# Patient Record
Sex: Male | Born: 1975 | Race: White | Hispanic: No | Marital: Married | Smoking: Current every day smoker
Health system: Southern US, Academic
[De-identification: ages and names within clinical notes are randomized; demographics above are authoritative.]

## PROBLEM LIST (undated history)

## (undated) DIAGNOSIS — K5190 Ulcerative colitis, unspecified, without complications: Secondary | ICD-10-CM

## (undated) DIAGNOSIS — M12811 Other specific arthropathies, not elsewhere classified, right shoulder: Secondary | ICD-10-CM

## (undated) DIAGNOSIS — M5020 Other cervical disc displacement, unspecified cervical region: Secondary | ICD-10-CM

## (undated) DIAGNOSIS — K284 Chronic or unspecified gastrojejunal ulcer with hemorrhage: Secondary | ICD-10-CM

## (undated) DIAGNOSIS — K219 Gastro-esophageal reflux disease without esophagitis: Secondary | ICD-10-CM

## (undated) DIAGNOSIS — I10 Essential (primary) hypertension: Secondary | ICD-10-CM

## (undated) DIAGNOSIS — E785 Hyperlipidemia, unspecified: Secondary | ICD-10-CM

## (undated) DIAGNOSIS — J449 Chronic obstructive pulmonary disease, unspecified: Secondary | ICD-10-CM

## (undated) DIAGNOSIS — R7303 Prediabetes: Secondary | ICD-10-CM

## (undated) DIAGNOSIS — J45909 Unspecified asthma, uncomplicated: Secondary | ICD-10-CM

## (undated) DIAGNOSIS — K649 Unspecified hemorrhoids: Secondary | ICD-10-CM

## (undated) DIAGNOSIS — E119 Type 2 diabetes mellitus without complications: Secondary | ICD-10-CM

## (undated) DIAGNOSIS — K274 Chronic or unspecified peptic ulcer, site unspecified, with hemorrhage: Secondary | ICD-10-CM

## (undated) DIAGNOSIS — M545 Low back pain, unspecified: Secondary | ICD-10-CM

## (undated) DIAGNOSIS — Z8614 Personal history of Methicillin resistant Staphylococcus aureus infection: Secondary | ICD-10-CM

## (undated) HISTORY — DX: Essential (primary) hypertension: I10

## (undated) HISTORY — DX: Chronic obstructive pulmonary disease, unspecified: J44.9

## (undated) HISTORY — DX: Chronic or unspecified gastrojejunal ulcer with hemorrhage: K28.4

## (undated) HISTORY — DX: Unspecified hemorrhoids: K64.9

## (undated) HISTORY — DX: Hyperlipidemia, unspecified: E78.5

## (undated) HISTORY — DX: Gastro-esophageal reflux disease without esophagitis: K21.9

## (undated) HISTORY — DX: Other cervical disc displacement, unspecified cervical region: M50.20

## (undated) HISTORY — PX: HX BACK SURGERY: SHX140

## (undated) HISTORY — PX: HX CYST REMOVAL: SHX22

## (undated) HISTORY — DX: Unspecified asthma, uncomplicated: J45.909

## (undated) HISTORY — DX: Chronic or unspecified peptic ulcer, site unspecified, with hemorrhage: K27.4

## (undated) HISTORY — DX: Ulcerative colitis, unspecified, without complications: K51.90

## (undated) HISTORY — DX: Other specific arthropathies, not elsewhere classified, right shoulder: M12.811

## (undated) HISTORY — PX: HX TONSILLECTOMY: SHX27

## (undated) HISTORY — PX: HX HERNIA REPAIR: SHX51

## (undated) HISTORY — DX: Low back pain, unspecified: M54.50

---
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
## 2020-09-09 ENCOUNTER — Encounter (INDEPENDENT_AMBULATORY_CARE_PROVIDER_SITE_OTHER): Payer: Self-pay | Admitting: Family

## 2020-09-15 ENCOUNTER — Encounter (INDEPENDENT_AMBULATORY_CARE_PROVIDER_SITE_OTHER): Payer: Self-pay | Admitting: Hematology & Oncology

## 2020-09-16 ENCOUNTER — Ambulatory Visit (INDEPENDENT_AMBULATORY_CARE_PROVIDER_SITE_OTHER): Payer: Medicaid Other | Admitting: Family

## 2020-09-16 ENCOUNTER — Encounter (INDEPENDENT_AMBULATORY_CARE_PROVIDER_SITE_OTHER): Payer: Self-pay | Admitting: Family

## 2020-09-16 ENCOUNTER — Other Ambulatory Visit: Payer: Self-pay

## 2020-09-16 VITALS — BP 146/83 | HR 81 | Temp 98.6°F | Ht 65.0 in | Wt 180.0 lb

## 2020-09-16 DIAGNOSIS — E876 Hypokalemia: Secondary | ICD-10-CM

## 2020-09-16 DIAGNOSIS — Z6829 Body mass index (BMI) 29.0-29.9, adult: Secondary | ICD-10-CM

## 2020-09-16 DIAGNOSIS — D649 Anemia, unspecified: Secondary | ICD-10-CM

## 2020-09-16 DIAGNOSIS — D72829 Elevated white blood cell count, unspecified: Secondary | ICD-10-CM

## 2020-09-16 NOTE — H&P (Addendum)
HEMATOLOGY/ONCOLOGY, [place],   [place]  [address]  [place], [place] [address]      New Patient History and Physical     Name: [name] MRN:  [id number]   Date: 09/16/2020 Age: 45 y.o. DOB: 1975-11-07       Referring Physician: Peri Maris, DO  Primary Care Provider: Peri Maris, DO    Reason for visit/consultation:   Results        History of Present Illness:  Patient is a 45 year old white male that was referred [place] for leukocytosis.  Patient states that he has had generalized abdominal pain for 2 years.  Patient states he has been diagnosed in the past with a bleeding ulcer.  Patient states that he has occasional blood and blood clots with his stool.  The patient states when this occurs the toilet is full blood.  Patient states this has been occurring for the past 2 years along with the abdominal pain.  He states that he was set up for testing when the symptoms initially occurred 2 years ago, but he did not complete them.  He states he lost both parents and a brother in a short period of time and was unable to do the testing.  He still has not completed that testing.  He cannot recall what test were ordered.  He states when the abdominal pain and bleeding started 2 years ago, he was able to tolerate it.  He states over the 2 year period the abdominal pain has gotten worse and he continues to have occasional rectal bleeding with blood clots.  He describes the abdominal pain as cramping and sharp.  He states that the pain is unbearable at times.  He is currently not taking anything for pain.  He states he has had appetite changes.  He states his appetite waxes and wanes.  He states that he does vomit after eating on occasion.  He attributes this to certain foods that he eats.  He states he has had weight fluctuations.  He states he will lose up to 10 lb over a few days and then he will gain it back.  His current weight today is 180 lb.  He states his  highest weight has been 190 lb and his lowest weight was 158 lb.  He states he does have night sweats.  He states that he only has to change his pillowcase due to the sweats and denies changing clothes.  He has complaints of weakness and fatigue.  Patient states that he can have up to 15 bowel movements per day.  He states some are more formed and some are watery.  He states he has the occasional bleeding and blood clots with his stool.  He states when his stool is formed is coated with a slimy gel.  He states has follow-up with Dr. Keith Rake in the past for issues with my liver.  He was sent last week for consult with Dr. Georgiann Cocker.  He states he has a colonoscopy scheduled on 09/23/2020.      Past Medical History  Past Medical History:   Diagnosis Date    Asthma     Bleeding hemorrhoid     Bleeding ulcer     Cervical herniated disc     COPD (chronic obstructive pulmonary disease) (CMS HCC)     GERD (gastroesophageal reflux disease)     Hyperlipidemia     Hypertension     Rotator cuff arthropathy, right  Past Surgical History:   Procedure Laterality Date    HX CYST REMOVAL      lower back    HX HERNIA REPAIR      HX TONSILLECTOMY        Family Medical History:     Problem Relation (Age of Onset)    Anesthesia Complications Father    Arthritis-osteo Mother, Father    Asthma Mother, Father, Brother, Paternal Grandmother, Paternal Grandfather    Blood Clots Father, Paternal Grandmother, Paternal Grandfather    Cancer Mother, Father    Congestive Heart Failure Father, Paternal 24, Paternal Grandfather    Coronary Artery Disease Father, Paternal 3, Paternal Grandfather    Diabetes Mother, Father    Heart Attack Father, Paternal 58, Paternal Grandfather    High Cholesterol Mother, Father    Hypertension (High Blood Pressure) Mother, Father    Stroke Father, Maternal Grandmother, Maternal Grandfather, Paternal 48, Paternal Grandfather          Social  History  Occupation:   Social History     Occupational History    Not on file    Hometown: [place] [address]  Social History     Socioeconomic History    Marital status: Married   Tobacco Use    Smoking status: Current Every Day Smoker     Packs/day: 1.00     Years: 30.00     Pack years: 30.00     Types: Cigarettes    Smokeless tobacco: Never Used   Brewing technologist Use: Never used   Substance and Sexual Activity    Alcohol use: Yes     Comment: occasional    Drug use: Never       Allergies   Allergen Reactions    Flexeril [Cyclobenzaprine]     Noctec [Chloral Hydrate]     Penicillins     Prednisone     Theophylline      Current Outpatient Medications   Medication Sig    ergocalciferol, vitamin D2, (DRISDOL) 1,250 mcg (50,000 unit) Oral Capsule     gabapentin (NEURONTIN) 300 mg Oral Capsule Take 300 mg by mouth Pt doesn't take as he should    Ibuprofen (MOTRIN) 200 mg Oral Tablet Take 200 mg by mouth Four times a day as needed for Pain    omeprazole (PRILOSEC) 40 mg Oral Capsule, Delayed Release(E.C.)        ROS:   Review of Systems   Constitutional: Positive for appetite change, chills, diaphoresis, fatigue and unexpected weight change. Negative for fever.   HENT:   Positive for sore throat and trouble swallowing.    Eyes: Negative.    Respiratory: Positive for cough. Negative for chest tightness, hemoptysis and shortness of breath.    Cardiovascular: Positive for chest pain. Negative for leg swelling and palpitations.   Gastrointestinal: Positive for abdominal pain, blood in stool, diarrhea, nausea and vomiting. Negative for constipation.   Genitourinary: Negative.     Musculoskeletal: Positive for arthralgias.   Skin: Positive for itching.   Neurological: Positive for light-headedness and numbness. Negative for dizziness, headaches and seizures.   Hematological: Negative for adenopathy. Bruises/bleeds easily.   Psychiatric/Behavioral: Positive for sleep disturbance. Negative for depression  and suicidal ideas. The patient is not nervous/anxious.        Physical Examination:  BP (!) 146/83 (Site: Right, Patient Position: Sitting, Cuff Size: Adult)    Pulse 81    Temp 37 C (98.6 F) (Skin)    Ht  1.651 m (5\' 5" )    Wt 81.6 kg (180 lb)    BMI 29.95 kg/m       Physical Exam  Vitals and nursing note reviewed.   Constitutional:       Appearance: Normal appearance.   HENT:      Head: Normocephalic and atraumatic.      Nose: Nose normal.      Mouth/Throat:      Mouth: Mucous membranes are moist.   Eyes:      Extraocular Movements: Extraocular movements intact.      Comments: Bilateral eyes are blood shot   Cardiovascular:      Rate and Rhythm: Normal rate and regular rhythm.      Heart sounds: Normal heart sounds, S1 normal and S2 normal.   Pulmonary:      Effort: Pulmonary effort is normal.      Breath sounds: Normal breath sounds.   Abdominal:      General: Bowel sounds are normal.      Palpations: Abdomen is soft.      Tenderness: There is abdominal tenderness in the epigastric area.   Musculoskeletal:         General: Normal range of motion.      Cervical back: Normal range of motion and neck supple. No bony tenderness.      Thoracic back: No bony tenderness.      Lumbar back: No bony tenderness.   Lymphadenopathy:      Cervical: No cervical adenopathy.      Comments: No supraclavicular or axillary adenopathy.   Skin:     General: Skin is warm and dry.   Neurological:      General: No focal deficit present.      Mental Status: He is alert and oriented to person, place, and time.   Psychiatric:         Mood and Affect: Mood normal.         Speech: Speech normal.         Behavior: Behavior is cooperative.         Thought Content: Thought content normal.         Cognition and Memory: Cognition and memory normal.          Data reviewed:   Labs from 08/14/2020:  WBC 12.8, hemoglobin 16.3, platelets 410000, ANC 8.2, monos 1.1, eos 0.6, hepatitis panel is negative.  Peripheral smear showed neutrophilic  leukocytosis.  Cells are mature.  Monocytosis is present.    Ultrasound of the liver from 08/26/2020:  Liver:  Sonographic features that may reflect hepatic steatosis.  Probable hepatomegaly.  No focal hepatic lesion or abnormal intrahepatic biliary duct dilatation seen.  Gallbladder unremarkable.  Common hepatic duct within normal range in size measuring 0.47 cm in diameter.      Assessment/Plan:   Problem List Items Addressed This Visit        Hematologic/Lymphatic    Leucocytosis - Primary     We will obtain a CBC with diff, peripheral smear, LDH, CRP, sed rate on patient.  We will also obtain a lap score to out the possibility of CML.  We will await results of colonoscopy.  Will plan to see him in follow-up in 3 weeks after all test results are in.         Relevant Orders    CBC/DIFF    COMPREHENSIVE METABOLIC PANEL, NON-FASTING    LDH    C-REACTIVE PROTEIN(CRP),INFLAMMATION    SEDIMENTATION RATE  LEUKOCYTE ALKALINE PHOSPHATASE    GIEMSA STAIN, WITH PATHOLOGIST REVIEW    Anemia     We will check iron studies due to his current rectal bleeding.  We will also have him obtain a vitamin B12 and folate level.  We will await on colonoscopy results.         Relevant Orders    CBC/DIFF    COMPREHENSIVE METABOLIC PANEL, NON-FASTING    IRON    FERRITIN    IRON TRANSFERRIN AND TIBC    GIEMSA STAIN, WITH PATHOLOGIST REVIEW    RETICULOCYTE COUNT    VITAMIN B12    FOLATE          Return in about 3 weeks (around 10/07/2020) for In Person Visit.     Addendum 09/18/2020:  Patient had a potassium level return at 2.4.  Prescription called in for K-Dur 40 mEq daily.  I will mail patient a lab order to obtain a repeat potassium level on Wednesday April 6th.  Left a message for the patient on his voicemail in regards to the prescription and the new lab order.  Encouraged him to call the office if he had any questions or concerns.    Darlys [employer] engineer, FNP-C      CC:  Peri Maris, DO  [place] [id number]    No primary  care provider on file.    Portions of this note may be dictated using voice recognition software or a dictation service. Variances in spelling and vocabulary are possible and unintentional. Not all errors are caught/corrected. Please notify the [facility] if any discrepancies are noted or if the meaning of any statement is not clear.

## 2020-09-16 NOTE — Nursing Note (Signed)
New pt, c/o constant fatigue, no energy. Frequent diarrhea 10-15 per day if eats anything. Colonoscopy next week with Dr. Georgiann Cocker. Bleeding hemrrhoids. Also Dr. Keith Rake will be seeing pt, although not clear as to why Dr. Ferdinand Lango is referring him there. Did see Dr. Posey Pronto previously for elevated liver enzymes

## 2020-09-17 ENCOUNTER — Encounter (INDEPENDENT_AMBULATORY_CARE_PROVIDER_SITE_OTHER): Payer: Self-pay | Admitting: Family

## 2020-09-17 DIAGNOSIS — D72829 Elevated white blood cell count, unspecified: Secondary | ICD-10-CM | POA: Insufficient documentation

## 2020-09-17 DIAGNOSIS — D649 Anemia, unspecified: Secondary | ICD-10-CM | POA: Insufficient documentation

## 2020-09-17 NOTE — Assessment & Plan Note (Signed)
We will check iron studies due to his current rectal bleeding.  We will also have him obtain a vitamin B12 and folate level.  We will await on colonoscopy results.

## 2020-09-17 NOTE — Assessment & Plan Note (Addendum)
We will obtain a CBC with diff, peripheral smear, LDH, CRP, sed rate on patient.  We will also obtain a lap score to out the possibility of CML.  We will await results of colonoscopy.  Will plan to see him in follow-up in 3 weeks after all test results are in.

## 2020-09-18 MED ORDER — POTASSIUM CHLORIDE ER 20 MEQ TABLET,EXTENDED RELEASE(PART/CRYST)
20.0000 meq | ORAL_TABLET | Freq: Two times a day (BID) | ORAL | 0 refills | Status: DC
Start: 2020-09-18 — End: 2020-09-29

## 2020-09-18 NOTE — Addendum Note (Signed)
Addended by: Jerl Santos on: 09/18/2020 09:43 AM     Modules accepted: Orders

## 2020-09-18 NOTE — Addendum Note (Signed)
Addended by: Jerl Santos on: 09/18/2020 09:23 AM     Modules accepted: Orders

## 2020-09-23 ENCOUNTER — Telehealth (INDEPENDENT_AMBULATORY_CARE_PROVIDER_SITE_OTHER): Payer: Self-pay | Admitting: Family

## 2020-09-23 NOTE — Telephone Encounter (Signed)
Notified by lab of K+ 2.4.  Patient was started on K-dur 40 meq last Friday and K+ was reported as 2.4 at that time.  Spoke with patient and inquired if he was taking K-dur as prescribed. Patient states that he had been taking K-dur as prescribed until yesterday as he had EGD and colonoscopy.  Patient states that he has not taken K-dur today as he has felt nauseated, but no vomiting.  Informed patient that potassium level drawn today was still 2.4.  Explained to patient the importance of taking the K-Dur as prescribed and the potential consequences of not taking it such as cardiac arrhythmias.  Instructed patient to take K-Dur 80 mEq today only and starting tomorrow to take K-Dur 60 mEq daily.  Had patient repeat instructions back and he voiced them correctly.  Patient instructed to have potassium level repeated on Monday.  Patient voiced some resistance to this but informed patient that the extreme importance of getting the potassium level back near normal range and the only way to adjust his medication would be to know what the potassium level is.  Patient voiced understanding and was agreeable to have potassium level drawn on Monday.  Instructed him I with put orders in the mail for him to take to the lab on Monday.  Patient expressed no further questions.

## 2020-09-23 NOTE — Addendum Note (Signed)
Addended by: Jerl Santos on: 09/23/2020 01:08 PM     Modules accepted: Orders

## 2020-09-29 ENCOUNTER — Telehealth (INDEPENDENT_AMBULATORY_CARE_PROVIDER_SITE_OTHER): Payer: Self-pay | Admitting: Family

## 2020-09-29 DIAGNOSIS — E876 Hypokalemia: Secondary | ICD-10-CM

## 2020-09-29 MED ORDER — POTASSIUM CHLORIDE ER 20 MEQ TABLET,EXTENDED RELEASE(PART/CRYST)
20.0000 meq | ORAL_TABLET | Freq: Three times a day (TID) | ORAL | 0 refills | Status: AC
Start: 2020-09-29 — End: 2020-10-29

## 2020-09-29 NOTE — Telephone Encounter (Signed)
Spoke with patient in regards to potassium level.  Potassium level from yesterday 2.5.  Asked patient how he was taking his K-dur and he stated that he took 23meq as instructed x 1 day when I last spoke with him and then he has been taking 40 meq daily since. Patient was instructed to take 2meq daily on last call.  Instructed patient to increase potassium to 78meq (3 tabs) daily and have potassium rechecked on 10-05-20.  Instructed patient that i would mail order form to him. Patient repeated instructed back to me and voiced them correctly.  Wife was on speaker phone and listened to entire conversation.  Patient states that he was diagnosed with ulcerative colitis and was to see Dr. Raliegh Ip. patel tomorrow but had to cancel appointment as he just started a new job and was unable to take time off to go to appointment. Explained to patient the importance of seeing Dr. Raliegh Ip patel as he is the provider who would follow him for the ulcerative colitis.  He voiced understanding and states that he would r/s his appointment.

## 2020-10-07 ENCOUNTER — Encounter (INDEPENDENT_AMBULATORY_CARE_PROVIDER_SITE_OTHER): Payer: Self-pay | Admitting: Family

## 2020-10-07 ENCOUNTER — Other Ambulatory Visit: Payer: Self-pay

## 2020-10-07 ENCOUNTER — Ambulatory Visit (INDEPENDENT_AMBULATORY_CARE_PROVIDER_SITE_OTHER): Payer: Medicaid Other | Admitting: Family

## 2020-10-07 VITALS — BP 150/97 | HR 83 | Temp 98.7°F | Ht 65.0 in | Wt 178.0 lb

## 2020-10-07 DIAGNOSIS — E876 Hypokalemia: Secondary | ICD-10-CM | POA: Insufficient documentation

## 2020-10-07 DIAGNOSIS — D72829 Elevated white blood cell count, unspecified: Secondary | ICD-10-CM

## 2020-10-07 DIAGNOSIS — D649 Anemia, unspecified: Secondary | ICD-10-CM

## 2020-10-07 DIAGNOSIS — Z6829 Body mass index (BMI) 29.0-29.9, adult: Secondary | ICD-10-CM

## 2020-10-07 NOTE — Assessment & Plan Note (Signed)
Patient is currently on K-Dur 60 mEq daily.  Most recent potassium level was 3.1.  He will have a repeat CMP.  Instructed patient he would need to follow with his PCP in regards to his hypokalemia.  Patient voiced understanding.

## 2020-10-07 NOTE — Assessment & Plan Note (Signed)
We will assess his iron studies.

## 2020-10-07 NOTE — Cancer Center Note (Signed)
HEMATOLOGY/ONCOLOGY, [place]  [address]  [place], [place] [address]    PROGRESS NOTE        Date: 10/07/2020    Name: [name]  MRN: [id number]  DOB: 1976-05-13   Referring Physician: Peri Maris, DO  Primary Care Provider: Peri Maris, DO    Chief Complaint   Patient presents with   . Leukocytosis       Subjective:  This patient is being seen today in follow up for leukocytosis.  The patient had a colonoscopy done with Dr. Georgiann Cocker since his last visit here.  Patient states he was told he has ulcerative colitis.  Patient is already established with Dr. Keith Rake.  Patient states the diarrhea has eased up some.  He has not noticed any bleeding from his bowels.  Patient was found to be hypokalemic (2.4) with initial labs drawn here.  He is currently taking K-dur 55meq daily.  Potassium level drawn yesterday was 3.1.    ROS:     Pertinent review as addressed in HPI    Objective:   BP (!) 150/97 (Site: Right, Patient Position: Sitting, Cuff Size: Adult)   Pulse 83   Temp 37.1 C (98.7 F) (Skin)   Ht 1.651 m (5\' 5" )   Wt 80.7 kg (178 lb)   BMI 29.62 kg/m       Physical Exam  Vitals and nursing note reviewed.   Constitutional:       Appearance: Normal appearance.   HENT:      Head: Normocephalic.      Nose: Nose normal.      Mouth/Throat:      Mouth: Mucous membranes are moist.   Eyes:      General: No scleral icterus.     Extraocular Movements: Extraocular movements intact.   Cardiovascular:      Rate and Rhythm: Normal rate and regular rhythm.      Heart sounds: Normal heart sounds, S1 normal and S2 normal.   Pulmonary:      Effort: Pulmonary effort is normal.      Breath sounds: Normal breath sounds.   Abdominal:      General: Bowel sounds are normal.      Palpations: Abdomen is soft.      Tenderness: There is no abdominal tenderness.   Musculoskeletal:         General: Normal range of motion.      Cervical back: Normal range of  motion and neck supple. No bony tenderness.      Thoracic back: No bony tenderness.      Lumbar back: No bony tenderness.   Lymphadenopathy:      Cervical: No cervical adenopathy.      Comments: No supraclavicular adenopathy.   Skin:     General: Skin is warm and dry.   Neurological:      General: No focal deficit present.      Mental Status: He is alert and oriented to person, place, and time.   Psychiatric:         Mood and Affect: Mood normal.         Speech: Speech normal.         Behavior: Behavior is cooperative.         Thought Content: Thought content normal.         Cognition and Memory: Cognition and memory normal.        ECOG Status: 0 - Fully  active, able to carry on all pre-disease performance without restriction.     Current Outpatient Medications   Medication Sig   . budesonide (ENTOCORT EC) 3 mg Oral Capsule, Delayed & Ext.Release    . ergocalciferol, vitamin D2, (DRISDOL) 1,250 mcg (50,000 unit) Oral Capsule    . gabapentin (NEURONTIN) 300 mg Oral Capsule Take 300 mg by mouth Pt doesn't take as he should   . Ibuprofen (MOTRIN) 200 mg Oral Tablet Take 200 mg by mouth Four times a day as needed for Pain   . omeprazole (PRILOSEC) 40 mg Oral Capsule, Delayed Release(E.C.)    . potassium chloride (K-DUR) 20 mEq Oral Tab Sust.Rel. Particle/Crystal Take 1 Tablet (20 mEq total) by mouth Three times a day for 30 days Indications: low amount of potassium in the blood   . sucralfate (CARAFATE) 1 gram Oral Tablet      Allergies as of 10/07/2020 - Reviewed 10/07/2020   Allergen Reaction Noted   . Flexeril [cyclobenzaprine]  09/16/2020   . Noctec [chloral hydrate]  09/16/2020   . Penicillins  09/16/2020   . Prednisone  09/16/2020   . Theophylline  09/16/2020     Oncology History    No history exists.      .  Past Medical History:   Diagnosis Date   . Asthma    . Bleeding hemorrhoid    . Bleeding ulcer    . Cervical herniated disc    . COPD (chronic obstructive pulmonary disease) (CMS HCC)    . GERD  (gastroesophageal reflux disease)    . Hyperlipidemia    . Hypertension    . Rotator cuff arthropathy, right    . Ulcerative colitis (CMS [place])      .  Past Surgical History:   Procedure Laterality Date   . HX CYST REMOVAL      lower back   . HX HERNIA REPAIR     . HX TONSILLECTOMY         Labs:  Labs from 09/18/2019:  WBC 11.0, hemoglobin 16.7, platelets 353000, sed rate 10 iron 132, iron sat 26%, ferritin 33, LDH 238, C-reactive protein 0.8, LFTs within normal limits    Pathology from EGD from 09/22/2020:  Reactive gastropathy.  No intestinal metaplasia, dysplasia or malignancy identified.  Pathology from colonoscopy from 09/22/2020:  Colonic mucosa showed increased intraepithelial lymphocytes and mildly increased chronic inflammation within the lamina propria suggestive of lymphocytic colitis.  No active inflammation, granuloma, dysplasia or malignancy identified.    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  Patient needs to be followed by GI for this.  We will send patient today for a CBC with diff and a CMP.  We will see him in follow-up as needed.         Relevant Orders    CBC/DIFF    COMPREHENSIVE METABOLIC PANEL, NON-FASTING    Anemia     We will assess his iron studies.         Relevant Orders    IRON    FERRITIN    IRON TRANSFERRIN AND TIBC       Other    Hypokalemia     Patient is currently on K-Dur 60 mEq daily.  Most recent potassium level was 3.1.  He will have a repeat CMP.  Instructed patient  he would need to follow with his PCP in regards to his hypokalemia.  Patient voiced understanding.                Return if symptoms worsen or fail to improve, for In Person Visit.    [employer] engineer, FNP-C      CC:  PCP General:  Peri Maris, DO  [place] [address]      Portions of this note may be dictated using voice recognition software or a  dictation service. Variances in spelling and vocabulary are possible and unintentional. Not all errors are caught/corrected. Please notify the [facility] if any discrepancies are noted or if the meaning of any statement is not clear.

## 2020-10-07 NOTE — Assessment & Plan Note (Signed)
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  Patient needs to be followed by GI for this.  We will send patient today for a CBC with diff and a CMP.  We will see him in follow-up as needed.

## 2020-10-07 NOTE — Nursing Note (Signed)
Pt here for a follow up

## 2021-08-30 ENCOUNTER — Inpatient Hospital Stay (HOSPITAL_COMMUNITY): Payer: Medicaid Other | Admitting: Internal Medicine

## 2021-08-30 ENCOUNTER — Emergency Department (EMERGENCY_DEPARTMENT_HOSPITAL): Payer: Medicaid Other

## 2021-08-30 ENCOUNTER — Inpatient Hospital Stay
Admission: EM | Admit: 2021-08-30 | Discharge: 2021-09-06 | DRG: 386 | Disposition: A | Discharge status: Home / Self Care | Payer: Medicaid Other | Attending: Internal Medicine | Admitting: Internal Medicine

## 2021-08-30 ENCOUNTER — Other Ambulatory Visit: Payer: Self-pay

## 2021-08-30 ENCOUNTER — Encounter (HOSPITAL_COMMUNITY): Payer: Self-pay

## 2021-08-30 ENCOUNTER — Emergency Department (HOSPITAL_COMMUNITY): Payer: Medicaid Other

## 2021-08-30 DIAGNOSIS — K2950 Unspecified chronic gastritis without bleeding: Secondary | ICD-10-CM | POA: Diagnosis present

## 2021-08-30 DIAGNOSIS — F17210 Nicotine dependence, cigarettes, uncomplicated: Secondary | ICD-10-CM | POA: Diagnosis present

## 2021-08-30 DIAGNOSIS — K529 Noninfective gastroenteritis and colitis, unspecified: Secondary | ICD-10-CM

## 2021-08-30 DIAGNOSIS — Z79899 Other long term (current) drug therapy: Secondary | ICD-10-CM

## 2021-08-30 DIAGNOSIS — K769 Liver disease, unspecified: Secondary | ICD-10-CM | POA: Diagnosis present

## 2021-08-30 DIAGNOSIS — K760 Fatty (change of) liver, not elsewhere classified: Secondary | ICD-10-CM | POA: Diagnosis present

## 2021-08-30 DIAGNOSIS — E669 Obesity, unspecified: Secondary | ICD-10-CM | POA: Diagnosis present

## 2021-08-30 DIAGNOSIS — E872 Acidosis, unspecified: Secondary | ICD-10-CM | POA: Diagnosis present

## 2021-08-30 DIAGNOSIS — Z6831 Body mass index (BMI) 31.0-31.9, adult: Secondary | ICD-10-CM

## 2021-08-30 DIAGNOSIS — Z720 Tobacco use: Secondary | ICD-10-CM | POA: Diagnosis present

## 2021-08-30 DIAGNOSIS — E785 Hyperlipidemia, unspecified: Secondary | ICD-10-CM | POA: Diagnosis present

## 2021-08-30 DIAGNOSIS — F17200 Nicotine dependence, unspecified, uncomplicated: Secondary | ICD-10-CM | POA: Insufficient documentation

## 2021-08-30 DIAGNOSIS — K644 Residual hemorrhoidal skin tags: Secondary | ICD-10-CM | POA: Diagnosis present

## 2021-08-30 DIAGNOSIS — K219 Gastro-esophageal reflux disease without esophagitis: Secondary | ICD-10-CM | POA: Diagnosis present

## 2021-08-30 DIAGNOSIS — J449 Chronic obstructive pulmonary disease, unspecified: Secondary | ICD-10-CM

## 2021-08-30 DIAGNOSIS — R609 Edema, unspecified: Secondary | ICD-10-CM

## 2021-08-30 DIAGNOSIS — Z6830 Body mass index (BMI) 30.0-30.9, adult: Secondary | ICD-10-CM

## 2021-08-30 DIAGNOSIS — K2210 Ulcer of esophagus without bleeding: Secondary | ICD-10-CM | POA: Diagnosis present

## 2021-08-30 DIAGNOSIS — Z6829 Body mass index (BMI) 29.0-29.9, adult: Secondary | ICD-10-CM

## 2021-08-30 DIAGNOSIS — M129 Arthropathy, unspecified: Secondary | ICD-10-CM | POA: Diagnosis present

## 2021-08-30 DIAGNOSIS — K7460 Unspecified cirrhosis of liver: Secondary | ICD-10-CM | POA: Diagnosis present

## 2021-08-30 DIAGNOSIS — I10 Essential (primary) hypertension: Secondary | ICD-10-CM | POA: Diagnosis present

## 2021-08-30 DIAGNOSIS — K5190 Ulcerative colitis, unspecified, without complications: Secondary | ICD-10-CM

## 2021-08-30 DIAGNOSIS — D72829 Elevated white blood cell count, unspecified: Secondary | ICD-10-CM | POA: Diagnosis present

## 2021-08-30 DIAGNOSIS — I8510 Secondary esophageal varices without bleeding: Secondary | ICD-10-CM | POA: Diagnosis present

## 2021-08-30 DIAGNOSIS — R6889 Other general symptoms and signs: Secondary | ICD-10-CM

## 2021-08-30 DIAGNOSIS — G8929 Other chronic pain: Secondary | ICD-10-CM | POA: Diagnosis present

## 2021-08-30 DIAGNOSIS — R7989 Other specified abnormal findings of blood chemistry: Secondary | ICD-10-CM | POA: Diagnosis present

## 2021-08-30 DIAGNOSIS — Z20822 Contact with and (suspected) exposure to covid-19: Secondary | ICD-10-CM | POA: Diagnosis present

## 2021-08-30 DIAGNOSIS — H9209 Otalgia, unspecified ear: Secondary | ICD-10-CM

## 2021-08-30 DIAGNOSIS — D72828 Other elevated white blood cell count: Secondary | ICD-10-CM

## 2021-08-30 DIAGNOSIS — K51811 Other ulcerative colitis with rectal bleeding: Principal | ICD-10-CM | POA: Diagnosis present

## 2021-08-30 DIAGNOSIS — Z8711 Personal history of peptic ulcer disease: Secondary | ICD-10-CM

## 2021-08-30 DIAGNOSIS — E876 Hypokalemia: Secondary | ICD-10-CM | POA: Diagnosis present

## 2021-08-30 DIAGNOSIS — R600 Localized edema: Secondary | ICD-10-CM | POA: Diagnosis present

## 2021-08-30 DIAGNOSIS — M5020 Other cervical disc displacement, unspecified cervical region: Secondary | ICD-10-CM | POA: Diagnosis present

## 2021-08-30 DIAGNOSIS — K449 Diaphragmatic hernia without obstruction or gangrene: Secondary | ICD-10-CM | POA: Diagnosis present

## 2021-08-30 DIAGNOSIS — J029 Acute pharyngitis, unspecified: Secondary | ICD-10-CM

## 2021-08-30 DIAGNOSIS — D649 Anemia, unspecified: Secondary | ICD-10-CM | POA: Diagnosis present

## 2021-08-30 HISTORY — DX: Personal history of Methicillin resistant Staphylococcus aureus infection: Z86.14

## 2021-08-30 LAB — COMPREHENSIVE METABOLIC PANEL, NON-FASTING
ALBUMIN/GLOBULIN RATIO: 1 (ref 0.8–1.4)
ALBUMIN: 3.6 g/dL (ref 3.5–5.7)
ALKALINE PHOSPHATASE: 126 U/L — ABNORMAL HIGH (ref 34–104)
ALT (SGPT): 43 U/L (ref 7–52)
ANION GAP: 16 mmol/L (ref 10–20)
AST (SGOT): 171 U/L — ABNORMAL HIGH (ref 13–39)
BILIRUBIN TOTAL: 1 mg/dL (ref 0.3–1.2)
BUN/CREA RATIO: 6 (ref 6–22)
BUN: 3 mg/dL — ABNORMAL LOW (ref 7–25)
CALCIUM, CORRECTED: 9.4 mg/dL (ref 8.9–10.8)
CALCIUM: 9 mg/dL (ref 8.6–10.3)
CHLORIDE: 93 mmol/L — ABNORMAL LOW (ref 98–107)
CO2 TOTAL: 27 mmol/L (ref 21–31)
CREATININE: 0.52 mg/dL — ABNORMAL LOW (ref 0.60–1.30)
ESTIMATED GFR: 128 mL/min/{1.73_m2} (ref >59)
GLOBULIN: 3.5 (ref 2.9–5.4)
GLUCOSE: 152 mg/dL — ABNORMAL HIGH (ref 74–109)
OSMOLALITY, CALCULATED: 272 mOsm/kg (ref 270–290)
POTASSIUM: 2.5 mmol/L — CL (ref 3.5–5.1)
PROTEIN TOTAL: 7.1 g/dL (ref 6.4–8.9)
SODIUM: 136 mmol/L (ref 136–145)

## 2021-08-30 LAB — ECG 12 LEAD
Atrial Rate: 98 {beats}/min
Calculated P Axis: 74 degrees
Calculated R Axis: 79 degrees
Calculated T Axis: 59 degrees
PR Interval: 176 ms
QRS Duration: 88 ms
QT Interval: 384 ms
QTC Calculation: 490 ms
Ventricular rate: 98 {beats}/min

## 2021-08-30 LAB — COVID-19, FLU A/B, RSV RAPID BY PCR
INFLUENZA VIRUS TYPE A: NOT DETECTED
INFLUENZA VIRUS TYPE B: NOT DETECTED
RESPIRATORY SYNCTIAL VIRUS (RSV): NOT DETECTED
SARS-CoV-2: NOT DETECTED

## 2021-08-30 LAB — URINALYSIS, MACROSCOPIC
BILIRUBIN: NEGATIVE mg/dL
BLOOD: NEGATIVE mg/dL
GLUCOSE: NEGATIVE mg/dL
KETONES: NEGATIVE mg/dL
LEUKOCYTES: NEGATIVE WBCs/uL
NITRITE: NEGATIVE
PH: 6.5 (ref 5.0–9.0)
PROTEIN: NEGATIVE mg/dL
SPECIFIC GRAVITY: 1.006 (ref 1.002–1.030)
UROBILINOGEN: NORMAL mg/dL

## 2021-08-30 LAB — CBC WITH DIFF
BASOPHIL #: 0.2 10*3/uL (ref 0.00–2.50)
BASOPHIL %: 1 % (ref 0–3)
EOSINOPHIL #: 0.2 10*3/uL (ref 0.00–2.40)
EOSINOPHIL %: 1 % (ref 0–7)
HCT: 38.4 % — ABNORMAL LOW (ref 42.0–51.0)
HGB: 13.5 g/dL (ref 13.5–18.0)
LYMPHOCYTE #: 2.4 10*3/uL (ref 2.10–11.00)
LYMPHOCYTE %: 15 % — ABNORMAL LOW (ref 25–45)
MCH: 34.3 pg — ABNORMAL HIGH (ref 27.0–32.0)
MCHC: 35.1 g/dL (ref 32.0–36.0)
MCV: 97.7 fL (ref 78.0–99.0)
MONOCYTE #: 1.6 10*3/uL (ref 0.00–4.10)
MONOCYTE %: 10 % (ref 0–12)
MPV: 6.9 fL — ABNORMAL LOW (ref 7.4–10.4)
NEUTROPHIL #: 12.2 10*3/uL (ref 4.10–29.00)
NEUTROPHIL %: 74 % (ref 40–76)
PLATELETS: 535 10*3/uL — ABNORMAL HIGH (ref 140–440)
RBC: 3.93 10*6/uL — ABNORMAL LOW (ref 4.20–6.00)
RDW: 17.3 % — ABNORMAL HIGH (ref 11.6–14.8)
WBC: 16.6 10*3/uL — ABNORMAL HIGH (ref 4.0–10.5)
WBCS UNCORRECTED: 16.6 10*3/uL

## 2021-08-30 LAB — BLOOD CULTURE HOLD

## 2021-08-30 LAB — SEDIMENTATION RATE: ERYTHROCYTE SEDIMENTATION RATE (ESR): 50 mm/hr — ABNORMAL HIGH (ref <=15)

## 2021-08-30 LAB — GOLD TOP TUBE

## 2021-08-30 LAB — PHOSPHORUS: PHOSPHORUS: 2.7 mg/dL — ABNORMAL LOW (ref 3.7–7.2)

## 2021-08-30 LAB — PTT (PARTIAL THROMBOPLASTIN TIME): APTT: 29.2 seconds (ref 26.0–36.0)

## 2021-08-30 LAB — PT/INR
INR: 1.12 (ref <=5.00)
PROTHROMBIN TIME: 13 seconds — ABNORMAL HIGH (ref 9.8–12.7)

## 2021-08-30 LAB — LACTIC ACID LEVEL W/ REFLEX FOR LEVEL >2.0: LACTIC ACID: 5.7 mmol/L (ref 0.5–2.2)

## 2021-08-30 LAB — URINALYSIS, MICROSCOPIC
RBCS: <1 /hpf (ref <4)
SQUAMOUS EPITHELIAL: <1 /hpf (ref <28)
WBCS: 1 /hpf (ref <6)

## 2021-08-30 LAB — LACTIC ACID TIMED: LACTIC ACID: 4.5 mmol/L (ref 0.5–2.2)

## 2021-08-30 LAB — C-REACTIVE PROTEIN (CRP): C-REACTIVE PROTEIN (CRP): 3.4 mg/dL — ABNORMAL HIGH (ref 0.1–0.5)

## 2021-08-30 LAB — MAGNESIUM: MAGNESIUM: 1.9 mg/dL (ref 1.9–2.7)

## 2021-08-30 LAB — RAPID THROAT SCREEN, STREPTOCOCCUS, WITH REFLEX: THROAT RAPID SCREEN, STREPTOCOCCUS: NEGATIVE

## 2021-08-30 LAB — BLUE TOP TUBE

## 2021-08-30 MED ORDER — FOLIC ACID 1 MG TABLET
ORAL_TABLET | ORAL | Status: AC
Start: 2021-08-30 — End: 2021-08-30
  Filled 2021-08-30: qty 1

## 2021-08-30 MED ORDER — SODIUM CHLORIDE 0.9 % INTRAVENOUS SOLUTION
INTRAVENOUS | Status: DC
Start: 2021-08-30 — End: 2021-08-31
  Administered 2021-08-31 (×2): 0 mL via INTRAVENOUS

## 2021-08-30 MED ORDER — PROCHLORPERAZINE EDISYLATE 10 MG/2 ML (5 MG/ML) INJECTION SOLUTION
10.0000 mg | Freq: Four times a day (QID) | INTRAMUSCULAR | Status: DC | PRN
Start: 2021-08-30 — End: 2021-09-05

## 2021-08-30 MED ORDER — GUAIFENESIN 100 MG/5 ML ORAL LIQUID
200.0000 mg | ORAL | Status: DC | PRN
Start: 2021-08-30 — End: 2021-09-06

## 2021-08-30 MED ORDER — ALBUTEROL SULFATE 2.5 MG/3 ML (0.083 %) SOLUTION FOR NEBULIZATION
2.5000 mg | INHALATION_SOLUTION | Freq: Four times a day (QID) | RESPIRATORY_TRACT | Status: DC
Start: 2021-08-30 — End: 2021-09-06
  Administered 2021-08-30 – 2021-09-03 (×15): 2.5 mg via RESPIRATORY_TRACT
  Administered 2021-09-03 (×2): 0 mg via RESPIRATORY_TRACT
  Administered 2021-09-04 – 2021-09-05 (×5): 2.5 mg via RESPIRATORY_TRACT
  Administered 2021-09-05: 0 mg via RESPIRATORY_TRACT
  Administered 2021-09-05 – 2021-09-06 (×5): 2.5 mg via RESPIRATORY_TRACT

## 2021-08-30 MED ORDER — PANTOPRAZOLE 40 MG INTRAVENOUS SOLUTION
40.0000 mg | Freq: Every day | INTRAVENOUS | Status: DC
Start: 2021-08-30 — End: 2021-09-03
  Administered 2021-08-30 – 2021-09-03 (×5): 40 mg via INTRAVENOUS
  Filled 2021-08-30 (×3): qty 10

## 2021-08-30 MED ORDER — IOHEXOL 350 MG IODINE/ML INTRAVENOUS SOLUTION
100.0000 mL | INTRAVENOUS | Status: AC
Start: 2021-08-30 — End: 2021-08-30
  Administered 2021-08-30: 100 mL via INTRAVENOUS

## 2021-08-30 MED ORDER — POTASSIUM CHLORIDE 20 MEQ/100ML IN STERILE WATER INTRAVENOUS PIGGYBACK
20.0000 meq | INJECTION | INTRAVENOUS | Status: AC
Start: 2021-08-30 — End: 2021-08-31
  Administered 2021-08-30: 20 meq via INTRAVENOUS
  Administered 2021-08-30: 0 meq via INTRAVENOUS
  Administered 2021-08-30: 20 meq via INTRAVENOUS
  Administered 2021-08-31: 0 meq via INTRAVENOUS

## 2021-08-30 MED ORDER — FOLIC ACID 1 MG TABLET
1.0000 mg | ORAL_TABLET | Freq: Every day | ORAL | Status: DC
Start: 2021-08-30 — End: 2021-09-06
  Administered 2021-08-30 – 2021-09-02 (×4): 1 mg via ORAL
  Administered 2021-09-03: 0 mg via ORAL
  Administered 2021-09-04 – 2021-09-06 (×3): 1 mg via ORAL
  Filled 2021-08-30 (×6): qty 1

## 2021-08-30 MED ORDER — POTASSIUM CHLORIDE 20 MEQ/100ML IN STERILE WATER INTRAVENOUS PIGGYBACK
INJECTION | INTRAVENOUS | Status: AC
Start: 2021-08-30 — End: 2021-08-30
  Filled 2021-08-30: qty 200

## 2021-08-30 MED ORDER — METRONIDAZOLE 500 MG/100 ML IN SODIUM CHLOR(ISO) INTRAVENOUS PIGGYBACK
500.0000 mg | INJECTION | Freq: Two times a day (BID) | INTRAVENOUS | Status: DC
Start: 2021-08-30 — End: 2021-09-05
  Administered 2021-08-30: 500 mg via INTRAVENOUS
  Administered 2021-08-30 – 2021-08-31 (×3): 0 mg via INTRAVENOUS
  Administered 2021-08-31 – 2021-09-01 (×3): 500 mg via INTRAVENOUS
  Administered 2021-09-01: 0 mg via INTRAVENOUS
  Administered 2021-09-01: 500 mg via INTRAVENOUS
  Administered 2021-09-01 – 2021-09-02 (×3): 0 mg via INTRAVENOUS
  Administered 2021-09-02 (×2): 500 mg via INTRAVENOUS
  Administered 2021-09-03 (×2): 0 mg via INTRAVENOUS
  Administered 2021-09-03 (×2): 500 mg via INTRAVENOUS
  Administered 2021-09-04: 0 mg via INTRAVENOUS
  Administered 2021-09-04: 500 mg via INTRAVENOUS
  Administered 2021-09-04: 0 mg via INTRAVENOUS
  Administered 2021-09-04 – 2021-09-05 (×2): 500 mg via INTRAVENOUS
  Administered 2021-09-05: 0 mg via INTRAVENOUS
  Filled 2021-08-30 (×10): qty 100

## 2021-08-30 MED ORDER — NICOTINE 21 MG/24 HR DAILY TRANSDERMAL PATCH
MEDICATED_PATCH | TRANSDERMAL | Status: AC
Start: 2021-08-30 — End: 2021-08-30
  Filled 2021-08-30: qty 1

## 2021-08-30 MED ORDER — ALUMINUM-MAG HYDROXIDE-SIMETHICONE 200 MG-200 MG-20 MG/5 ML ORAL SUSP
10.0000 mL | ORAL | Status: DC | PRN
Start: 2021-08-30 — End: 2021-09-06

## 2021-08-30 MED ORDER — OXYCODONE-ACETAMINOPHEN 5 MG-325 MG TABLET
1.0000 | ORAL_TABLET | ORAL | Status: DC | PRN
Start: 2021-08-30 — End: 2021-09-06

## 2021-08-30 MED ORDER — ALBUTEROL SULFATE 2.5 MG/3 ML (0.083 %) SOLUTION FOR NEBULIZATION
2.5000 mg | INHALATION_SOLUTION | RESPIRATORY_TRACT | Status: DC | PRN
Start: 2021-08-30 — End: 2021-09-06

## 2021-08-30 MED ORDER — METHOCARBAMOL 750 MG TABLET
750.0000 mg | ORAL_TABLET | Freq: Three times a day (TID) | ORAL | Status: DC
Start: 2021-08-30 — End: 2021-09-06
  Administered 2021-08-30 – 2021-09-03 (×12): 750 mg via ORAL
  Administered 2021-09-03: 0 mg via ORAL
  Administered 2021-09-04 – 2021-09-06 (×8): 750 mg via ORAL
  Filled 2021-08-30 (×18): qty 1

## 2021-08-30 MED ORDER — POTASSIUM CHLORIDE ER 20 MEQ TABLET,EXTENDED RELEASE(PART/CRYST)
ORAL_TABLET | ORAL | Status: AC
Start: 2021-08-30 — End: 2021-08-30
  Filled 2021-08-30: qty 2

## 2021-08-30 MED ORDER — PANTOPRAZOLE 40 MG INTRAVENOUS SOLUTION
INTRAVENOUS | Status: AC
Start: 2021-08-30 — End: 2021-08-30
  Filled 2021-08-30: qty 10

## 2021-08-30 MED ORDER — ACETAMINOPHEN 325 MG TABLET
650.0000 mg | ORAL_TABLET | ORAL | Status: DC | PRN
Start: 2021-08-30 — End: 2021-09-06
  Administered 2021-08-31: 650 mg via ORAL
  Filled 2021-08-30: qty 2

## 2021-08-30 MED ORDER — LACTATED RINGERS IV BOLUS
1000.0000 mL | INJECTION | Status: AC
Start: 2021-08-30 — End: 2021-08-30
  Administered 2021-08-30: 0 mL via INTRAVENOUS
  Administered 2021-08-30: 1000 mL via INTRAVENOUS

## 2021-08-30 MED ORDER — POTASSIUM CHLORIDE ER 20 MEQ TABLET,EXTENDED RELEASE(PART/CRYST)
10.0000 meq | ORAL_TABLET | ORAL | Status: DC
Start: 2021-08-30 — End: 2021-08-30

## 2021-08-30 MED ORDER — POTASSIUM CHLORIDE ER 20 MEQ TABLET,EXTENDED RELEASE(PART/CRYST)
40.0000 meq | ORAL_TABLET | Freq: Once | ORAL | Status: AC
Start: 2021-08-30 — End: 2021-08-30
  Administered 2021-08-30: 40 meq via ORAL

## 2021-08-30 MED ORDER — POTASSIUM BICARBONATE-CITRIC ACID 25 MEQ EFFERVESCENT TABLET
25.0000 meq | EFFERVESCENT_TABLET | ORAL | Status: AC
Start: 2021-08-30 — End: 2021-08-30
  Administered 2021-08-30: 25 meq via ORAL

## 2021-08-30 MED ORDER — MORPHINE 4 MG/ML INJECTION WRAPPER
4.0000 mg | INJECTION | INTRAMUSCULAR | Status: DC | PRN
Start: 2021-08-30 — End: 2021-09-05

## 2021-08-30 MED ORDER — NICOTINE 21 MG/24 HR DAILY TRANSDERMAL PATCH
21.0000 mg | MEDICATED_PATCH | Freq: Every day | TRANSDERMAL | Status: DC
Start: 2021-08-30 — End: 2021-09-06
  Administered 2021-08-30 – 2021-09-03 (×5): 21 mg via TRANSDERMAL
  Administered 2021-09-04: 0 mg via TRANSDERMAL
  Administered 2021-09-05 – 2021-09-06 (×2): 21 mg via TRANSDERMAL
  Filled 2021-08-30 (×6): qty 1

## 2021-08-30 MED ORDER — LORAZEPAM 2 MG/ML INJECTION WRAPPER
0.5000 mg | INTRAMUSCULAR | Status: DC | PRN
Start: 2021-08-30 — End: 2021-09-05

## 2021-08-30 MED ORDER — LEVOFLOXACIN 500 MG/100 ML IN 5 % DEXTROSE INTRAVENOUS PIGGYBACK
500.0000 mg | INJECTION | INTRAVENOUS | Status: DC
Start: 2021-08-30 — End: 2021-09-05
  Administered 2021-08-30: 0 mg via INTRAVENOUS
  Administered 2021-08-30 – 2021-08-31 (×2): 500 mg via INTRAVENOUS
  Administered 2021-08-31: 0 mg via INTRAVENOUS
  Administered 2021-09-01: 500 mg via INTRAVENOUS
  Administered 2021-09-01 – 2021-09-02 (×2): 0 mg via INTRAVENOUS
  Administered 2021-09-02: 500 mg via INTRAVENOUS
  Administered 2021-09-03: 0 mg via INTRAVENOUS
  Administered 2021-09-03: 500 mg via INTRAVENOUS
  Administered 2021-09-04: 0 mg via INTRAVENOUS
  Administered 2021-09-04: 500 mg via INTRAVENOUS
  Filled 2021-08-30 (×5): qty 100

## 2021-08-30 MED ORDER — LACTATED RINGERS IV BOLUS
1000.0000 mL | INJECTION | Status: AC
Start: 2021-08-30 — End: 2021-08-30
  Administered 2021-08-30: 1000 mL via INTRAVENOUS
  Administered 2021-08-30: 0 mL via INTRAVENOUS

## 2021-08-30 MED ORDER — ONDANSETRON HCL (PF) 4 MG/2 ML INJECTION SOLUTION
4.0000 mg | Freq: Four times a day (QID) | INTRAMUSCULAR | Status: DC | PRN
Start: 2021-08-30 — End: 2021-09-05

## 2021-08-30 MED ORDER — POTASSIUM CHLORIDE 20 MEQ/100ML IN STERILE WATER INTRAVENOUS PIGGYBACK
20.0000 meq | INJECTION | INTRAVENOUS | Status: AC
Start: 2021-08-30 — End: 2021-08-30
  Administered 2021-08-30: 0 meq via INTRAVENOUS
  Administered 2021-08-30: 20 meq via INTRAVENOUS

## 2021-08-30 MED ORDER — METRONIDAZOLE 500 MG/100 ML IN SODIUM CHLOR(ISO) INTRAVENOUS PIGGYBACK
INJECTION | INTRAVENOUS | Status: AC
Start: 2021-08-30 — End: 2021-08-30
  Filled 2021-08-30: qty 100

## 2021-08-30 MED ORDER — POTASSIUM BICARBONATE-CITRIC ACID 25 MEQ EFFERVESCENT TABLET
EFFERVESCENT_TABLET | ORAL | Status: AC
Start: 2021-08-30 — End: 2021-08-30
  Filled 2021-08-30: qty 2

## 2021-08-30 MED ORDER — POTASSIUM CHLORIDE 20 MEQ/100ML IN STERILE WATER INTRAVENOUS PIGGYBACK
INJECTION | INTRAVENOUS | Status: AC
Start: 2021-08-30 — End: 2021-08-30
  Filled 2021-08-30: qty 100

## 2021-08-30 MED ORDER — LEVOFLOXACIN 500 MG/100 ML IN 5 % DEXTROSE INTRAVENOUS PIGGYBACK
INJECTION | INTRAVENOUS | Status: AC
Start: 2021-08-30 — End: 2021-08-30
  Filled 2021-08-30: qty 100

## 2021-08-30 MED ORDER — METHOCARBAMOL 750 MG TABLET
ORAL_TABLET | ORAL | Status: AC
Start: 2021-08-30 — End: 2021-08-30
  Filled 2021-08-30: qty 1

## 2021-08-30 NOTE — ED Provider Notes (Signed)
[facility]  ED Primary Provider Note  History of Present Illness   chief complaint    Arrival: The patient arrived by Car         [name] is a 46 y.o. male who had concerns including Abnormal Lab Result. Pt was seen by his PCP Dr. Ferdinand Lango last week for generalized fatigue, and sore throat. The office called this morning to let him know his labs were abnormal and to be seen in the ED. He reports feeling poorly, nausea, sore throat, and ear pain. He has a history of chronic diarrhea. He reports he has not been eating during the day because he has to go to the bathroom 5-10 minutes after eating. He has been seen by two GI doctors who referred him back to PCP for management.         Review of Systems     No other overt Review of Systems are noted to be positive except noted in the HPI.      Historical Data   History Reviewed This Encounter: Medical History  Surgical History  Family History  Social History        Physical Exam   ED Triage Vitals [08/30/21 1030]   BP (Non-Invasive) 132/70   Heart Rate 99   Respiratory Rate 18   Temperature 36.4 C (97.5 F)   SpO2 97 %   Weight 84.4 kg (186 lb)   Height 1.651 m (5' 5")         Exam: General: Acutely ill appaering  HENT:  Head: Normocephalic and atraumatic.  Mouth/Throat: Oropharynx is clear and moist. Uvula midline.  Posterior oropharynx erythematous.  TM's obstructed by cerumen.   Eyes: EOMI, PERRL. normal conjunctiva without injection, lids without swelling or exudate  Neck: Trachea midline. Neck supple.  Cardiovascular: RRR, No murmurs, rubs or gallops. Peripheral pulses intact, equal bilaterally.   Respiratory: Scattered wheezes that do not clear with cough.  No accessory muscle accessory muscle use  Abdominal: Bowel sounds hypoactive, abdomen rigid and distended, no fluid wave noted.   Back: No midline spinal tenderness, no paraspinal tenderness, no CVA tenderness.       Musculoskeletal: No edema, tenderness or  deformity.  Skin: Warm and dry. No obvious lesion, rash, ulcers, erythema, pallor or cyanosis  Psychiatric: normal mood and affect. Behavior is normal.  Neurological: A&O x3, no acute deficit, focal weakness. Moves all extremities. Able to ambulate without assistance.    Physical Exam                   Procedures      Patient Data     Labs Ordered/Reviewed   COMPREHENSIVE METABOLIC PANEL, NON-FASTING - Abnormal; Notable for the following components:       Result Value    POTASSIUM 2.5 (*)     CHLORIDE 93 (*)     BUN 3 (*)     CREATININE 0.52 (*)     GLUCOSE 152 (*)     ALKALINE PHOSPHATASE 126 (*)     AST (SGOT) 171 (*)     All other components within normal limits    Narrative:     Estimated Glomerular Filtration Rate (eGFR) is calculated using the CKD-EPI (2021) equation, intended for patients 92 years of age and older. If gender is not documented or "unknown", there will be no eGFR calculation.   LACTIC ACID LEVEL W/ REFLEX FOR LEVEL >2.0 - Abnormal; Notable for the following components:  LACTIC ACID 5.7 (*)     All other components within normal limits   PHOSPHORUS - Abnormal; Notable for the following components:    PHOSPHORUS 2.7 (*)     All other components within normal limits   SEDIMENTATION RATE - Abnormal; Notable for the following components:    ERYTHROCYTE SEDIMENTATION RATE (ESR) 50 (*)     All other components within normal limits   C-REACTIVE PROTEIN (CRP) - Abnormal; Notable for the following components:    C-REACTIVE PROTEIN (CRP) 3.4 (*)     All other components within normal limits   CBC WITH DIFF - Abnormal; Notable for the following components:    WBC 16.6 (*)     RBC 3.93 (*)     HCT 38.4 (*)     MCH 34.3 (*)     RDW 17.3 (*)     PLATELETS 535 (*)     MPV 6.9 (*)     LYMPHOCYTE % 15 (*)     All other components within normal limits   LACTIC ACID TIMED - Abnormal; Notable for the following components:    LACTIC ACID 4.5 (*)     All other components within normal limits   PT/INR - Abnormal;  Notable for the following components:    PROTHROMBIN TIME 13.0 (*)     All other components within normal limits    Narrative:     INR OF 2.0-3.0  RECOMMENDED FOR: PROPHYLAXIS/TREATMENT OF VENEOUS THROMBOSIS, PULMONARY EMBOLISM, PREVENTION OF SYSTEMIC EMBOLISM FROM ATRIAL FIBRILATION, MYOCARDIAL INFARCTION.    INR OF 2.5-3.5  RECOMMENDED FOR MECHANICAL PROSTHETIC HEART VALVES, RECURRENT SYSTEMIC EMBOLISM, RECURRENT MYOCARDIAL INFARCTION.     RAPID THROAT SCREEN, STREPTOCOCCUS, WITH REFLEX - Normal    Narrative:     Walk-Away Mode   MAGNESIUM - Normal   COVID-19, FLU A/B, RSV RAPID BY PCR - Normal    Narrative:     Results are for the simultaneous qualitative identification of SARS-CoV-2 (formerly 2019-nCoV), Influenza A, Influenza B, and RSV RNA. These etiologic agents are generally detectable in nasopharyngeal and nasal swabs during the ACUTE PHASE of infection. Hence, this test is intended to be performed on respiratory specimens collected from individuals with signs and symptoms of upper respiratory tract infection who meet Centers for Disease Control and Prevention (CDC) clinical and/or epidemiological criteria for Coronavirus Disease 2019 (COVID-19) testing. CDC COVID-19 criteria for testing on human specimens is available at [facility] webpage information for Healthcare Professionals: Coronavirus Disease 2019 (COVID-19) ([email or web address]).     False-negative results may occur if the virus has genomic mutations, insertions, deletions, or rearrangements or if performed very early in the course of illness. Otherwise, negative results indicate virus specific RNA targets are not detected, however negative results do not preclude SARS-CoV-2 infection/COVID-19, Influenza, or Respiratory syncytial virus infection. Results should not be used as the sole basis for patient management decisions. Negative results must be combined with clinical observations, patient history, and  epidemiological information. If upper respiratory tract infection is still suspected based on exposure history together with other clinical findings, re-testing should be considered.    Disclaimer:   This assay has been authorized by FDA under an Emergency Use Authorization for use in laboratories certified under the Clinical Laboratory Improvement Amendments of 1988 (CLIA), 42 U.S.C. 312 109 1056, to perform high complexity tests. The impacts of vaccines, antiviral therapeutics, antibiotics, chemotherapeutic or immunosuppressant drugs have not been evaluated.     Test methodology:   Cepheid Xpert Xpress SARS-CoV-2/Flu/RSV Assay real-time polymerase chain  reaction (RT-PCR) test on the GeneXpert Dx and Xpert Xpress systems.   URINALYSIS, MACROSCOPIC - Normal   URINALYSIS, MICROSCOPIC - Normal   PTT (PARTIAL THROMBOPLASTIN TIME) - Normal   BLOOD CULTURE HOLD   THROAT CULTURE, BETA HEMOLYTIC STREPTOCOCCUS   CBC/DIFF    Narrative:     The following orders were created for panel order CBC/DIFF.  Procedure                               Abnormality         Status                     ---------                               -----------         ------                     CBC WITH HBZJ[[id number]]                Abnormal            Final result                 Please view results for these tests on the individual orders.   EXTRA TUBES    Narrative:     The following orders were created for panel order EXTRA TUBES.  Procedure                               Abnormality         Status                     ---------                               -----------         ------                     BLUE TOP OFBP[[id number]]                                    Final result               GOLD TOP OEUM[[id number]]                                    Final result               BLOOD CULTURE VQMG[[id number]]                               Final result                 Please view results for these tests on the individual orders.   BLUE TOP TUBE   GOLD TOP  TUBE   URINALYSIS, MACROSCOPIC AND MICROSCOPIC W/CULTURE REFLEX    Narrative:     The following orders were created for panel order URINALYSIS, MACROSCOPIC AND MICROSCOPIC W/CULTURE REFLEX [PRN  ONLY].  Procedure                               Abnormality         Status                     ---------                               -----------         ------                     URINALYSIS, MACROSCOPIC[[id number]]      Normal              Final result               URINALYSIS, MICROSCOPIC[[id number]]      Normal              Final result                 Please view results for these tests on the individual orders.       CT ABDOMEN PELVIS W IV CONTRAST   Final Result by Edi, Radresults In (03/13 1434)   Diffuse hepatic steatosis with lower density areas seen throughout the periphery of the liver possibly indicating heterogenous fatty deposition. This could be further evaluated with a follow-up nonemergent MRI of the liver with and without contrast.      There is wall thickening involving the sigmoid colon which could represent colitis in the correct clinical setting. Please correlate with patient's clinical signs and symptoms. Follow-up colonoscopy is recommended if one has not been recently performed.         One or more dose reduction techniques were used (e.g., Automated exposure control, adjustment of the mA and/or kV according to patient size, use of iterative reconstruction technique).         Radiologist location ID: [id number]         XR CHEST PA AND LATERAL   Final Result by Edi, Radresults In (03/13 1320)   1. Mildly hyperexpanded lungs.   2. Borderline cardiomegaly.   3. No acute cardiopulmonary process, infiltrates, consolidation or effusion.   4. No significant change since prior study.         Radiologist location ID: [id number]    Pt presents after being sent by Dr. Ferdinand Lango for abnormal labs.  Lab studies were repeated and noted to have multiple abnormalities.  On  physical exam patient had hyperactive bowel sounds, rigid abdomen.  Spoke to [place] with the hospitalists at 1702, who requested I contact surgeon on call and have them evaluate pt prior to their evaluation. Spoke to Dr. Georgiann Cocker at [phone]. Spoke to [place] at 1805, will evaluate patient. Pt was fluid resucciated and potassium was replaced PO and IV.          MDM           Medications Administered in the ED   methocarbamol (ROBAXIN) tablet (has no administration in time range)   albuterol (PROVENTIL) 2.5 mg / 3 mL (0.083%) neb solution (has no administration in time range)   albuterol (PROVENTIL) 2.5 mg / 3 mL (0.083%) neb solution (has no administration in time range)  acetaminophen (TYLENOL) tablet (has no administration in time range)   aluminum-magnesium hydroxide-simethicone (MAG-AL PLUS) 200-200-20 mg per 5 mL oral liquid (has no administration in time range)   folic acid (FOLVITE) tablet (has no administration in time range)   prochlorperazine (COMPAZINE) 5 mg/mL injection (has no administration in time range)   ondansetron (ZOFRAN) 2 mg/mL injection (has no administration in time range)   NS premix infusion (has no administration in time range)   pantoprazole (PROTONIX) injection (has no administration in time range)   nicotine (NICODERM CQ) transdermal patch (mg/24 hr) (has no administration in time range)   potassium chloride 20 mEq in SW 100 mL premix infusion (has no administration in time range)   potassium chloride (K-DUR) extended release tablet (has no administration in time range)   levoFLOXacin (LEVAQUIN) 500 mg in D5W 100 mL premix IVPB (has no administration in time range)   metroNIDAZOLE (FLAGYL) 500 mg in NS 100 mL premix IVPB (has no administration in time range)   morphine 4 mg/mL injection (has no administration in time range)   oxyCODONE-acetaminophen (PERCOCET) 5-370m per tablet (has no administration in time range)   LORazepam (ATIVAN) 2 mg/mL injection (has no administration in time  range)   guaiFENesin 1067mper 50m33mral liquid - for cough (expectorant) (has no administration in time range)   LR bolus infusion 1,000 mL (0 mL Intravenous Stopped 08/30/21 1407)   potassium chloride 20 mEq in SW 100 mL premix infusion (0 mEq Intravenous Stopped 08/30/21 1440)   potassium bicarbonate-citric acid (EFFER-K) effervescent tablet (25 mEq Oral Given 08/30/21 1340)   LR bolus infusion 1,000 mL (0 mL Intravenous Stopped 08/30/21 1549)   iohexol (OMNIPAQUE 350) infusion (100 mL Intravenous Given 08/30/21 1418)       Patient will be admitted to the  service for further workup and management.    Disposition: Admitted                 Clinical Impression   Hypokalemia (Primary)   Colitis   Chronic obstructive pulmonary disease, unspecified COPD type [facility]       Current Discharge Medication List            [place]

## 2021-08-30 NOTE — ED Nurses Note (Signed)
ORDERS FROM HOSPITALIST- MATT TERRY- TO GIVE 2 POTASSIUM SLOW RIDERS.

## 2021-08-30 NOTE — H&P (Signed)
Hospitalist  Admission History and Physical      PATIENT:  [name] DOB:  Aug 31, 1975   CSN:  [id number] AGE:  46 y.o.   MRN:  [id number] SEX:   male            PCP: Peri Maris, DO   Date of Admission:  08/30/2021          HPI:   [name] is a 46 y.o. White male a patient of Peri Maris, DO who is being admitted through the emergency room with chief complaint of abnormal labs. Patient states he had swelling in his legs which was worse at night but better in the morning. He went to his PCP who did labs. He says he was called and told to come to the ER because all his labs were messed up. Patient states he has a hx of ulcerative colitis. Was treated by Dr Posey Pronto but the medications never helped so he is off them and was referred back to his PCP. He states he has about 10 BMs daily. They are usually semi-formed or loose. Has nausea and vomiting daily. Sometimes with or without food. Has chronic cramping abdominal pain. No aggravating or relieving factors identified.    Allergies:   Allergies   Allergen Reactions   . Flexeril [Cyclobenzaprine]    . Noctec [Chloral Hydrate]    . Penicillins    . Prednisone    . Theophylline       PMHx:   Past Medical History:   Diagnosis Date   . Asthma    . Bleeding hemorrhoid    . Bleeding ulcer    . Cervical herniated disc    . COPD (chronic obstructive pulmonary disease) (CMS HCC)    . GERD (gastroesophageal reflux disease)    . Hyperlipidemia    . Hypertension    . Rotator cuff arthropathy, right    . Ulcerative colitis (CMS [place])           PSHx:   Past Surgical History:   Procedure Laterality Date   . HX CYST REMOVAL      lower back   . HX HERNIA REPAIR     . HX TONSILLECTOMY            Home Medications:   Prior to Admission Medications   Prescriptions Last Dose Informant Patient Reported? Taking?   Ibuprofen (MOTRIN) 800 mg Oral Tablet  Pharmacy Yes Yes   Sig: Take 1 Tablet (800 mg total) by mouth Three times a day as needed for Pain   albuterol sulfate  (PROVENTIL OR VENTOLIN OR PROAIR) 90 mcg/actuation Inhalation oral inhaler  Pharmacy Yes Yes   Sig: Take 1-2 Puffs by inhalation Every 6 hours as needed for Other (shortness of breath)   methocarbamoL (ROBAXIN) 750 mg Oral Tablet  Pharmacy Yes Yes   Sig: Take 1 Tablet (750 mg total) by mouth Three times a day   omeprazole (PRILOSEC) 40 mg Oral Capsule, Delayed Release(E.C.)  Pharmacy Yes Yes   Sig: Take 1 Capsule (40 mg total) by mouth Once a day      Facility-Administered Medications: None     Social Hx:   Social History     Tobacco Use   . Smoking status: Every Day     Packs/day: 1.00     Years: 30.00     Pack years: 30.00     Types: Cigarettes   . Smokeless tobacco: Never   Substance Use Topics   .  Alcohol use: Yes     Comment: occasional      Family Hx:   Family Medical History:     Problem Relation (Age of Onset)    Anesthesia Complications Father    Arthritis-osteo Mother, Father    Asthma Mother, Father, Brother, Paternal Grandmother, Paternal Grandfather    Blood Clots Father, Paternal Grandmother, Paternal Grandfather    Cancer Mother, Father    Congestive Heart Failure Father, Paternal 58, Paternal Grandfather    Coronary Artery Disease Father, Paternal 36, Paternal Grandfather    Diabetes Mother, Father    Heart Attack Father, Paternal 29, Paternal Grandfather    High Cholesterol Mother, Father    Hypertension (High Blood Pressure) Mother, Father    Stroke Father, Maternal Grandmother, Maternal Grandfather, Paternal 27, Paternal Grandfather             Review of Systems    Constitutional- no fever, sweats, night sweats, change in fatigue level   EYE- no blurred vision, double vision   ENT-, no difficulty swallowing, no symptoms of oral candidiasis   Lungs- no complaint of respiratory distress, no wheezing   CV- no chest pain, palpitation   GI- as above  GU- no difficulty voiding, no flank pain   MS- no significant arthralgias      10 system review is otherwise negative  except as discussed above.    Physical:     Vitals   Patient Vitals for the past 8 hrs:   BP Pulse Resp SpO2   08/30/21 1345 -- 96 16 94 %   08/30/21 1330 137/87 96 16 96 %   08/30/21 1315 -- -- -- 95 %   08/30/21 1300 -- -- -- 94 %   08/30/21 1230 -- -- -- 96 %   08/30/21 1215 -- -- -- 90 %   08/30/21 1200 112/66 -- -- 92 %   08/30/21 1145 105/69 -- -- 93 %   08/30/21 1130 -- -- -- 90 %     General:  alert, mild distress  Neurologic: Grossly normal  HEENT: Normocephalic, without obvious abnormality, atraumatic  Lungs: dullness to percussion R apex, R anterior, R base, L apex, L anterior, L base, wheezes R apex, R anterior, R base, L apex, L anterior, L base, diminished breath sounds R apex, R anterior, R base, L apex, L anterior, L base  Heart: normal rate, regular rhythm, normal S1, S2, no murmurs, rubs, clicks or gallops  Abdomen: full, + BS, + tenderness mainly in right abdomen, no rebound or guarding  Extremities: edema trace in BLE, intact distal pulses, moves all extremities equally, normal strength, normal tone  Skin: intact      Lab Data:  Recent Labs     08/30/21  1138   WBC 16.6*   HGB 13.5   HCT 38.4*       Recent Labs     08/30/21  1138 08/30/21  1246   CO2 27  --    BUN 3*  --    GLUCOSE  --  Negative       Recent Labs     08/30/21  1138   TOTALPROTEIN 7.1   ALBUMIN 3.6   ALKPHOS 126*   AST 171*   ALT 43     Recent Labs     08/30/21  1151   INR 1.12       No results for input(s): CPK, CKMB, TROPONIN in the last 72 hours.    Data Review:  EKG: sinus    XRAY Chest: neg    CT A/P:IMPRESSION:  Diffuse hepatic steatosis with lower density areas seen throughout the periphery of the liver possibly indicating heterogenous fatty deposition. This could be further evaluated with a follow-up nonemergent MRI of the liver with and without contrast.    There is wall thickening involving the sigmoid colon which could represent colitis in the correct clinical setting. Please correlate with patient's clinical signs  and symptoms. Follow-up colonoscopy is recommended if one has not been recently performed    Past records available through EPIC        Assessment:         Plan:   1. DVT prophylaxis: SCDs  2. Gastritis prevention: ppi  3. Code status: full  4. Colitis: hx of ulcerative colitis. Will treat with abx. Supportive care. Have seen by GI.  5. Leukocytosis: suspect this is related to above. Follow labs.  6. Tobacco abuse: patch, cessation education.  7. COPD: has wheezing. Albuterol nebs.  8. HTN: stable.  9. Hypokalemia: replete and follow labs.  10. Lactic acidosis: should resolve with hydration. Follow am level.    Plan discussed with patient and family, and they agree with the current plan.    The patient was seen by me on 08-30-2021 at about 1800      Julian Hy, MD, 08/30/2021 18:41

## 2021-08-30 NOTE — ED Nurses Note (Signed)
PT ASSIGNED TO ER MINOR ROOM 28. PT HERE TODAY WITH C/O OF ABNORMAL LAB WORK TAKEN LAST WEEK. PT WAS CALLED THIS MORNING AND TOLD TO COME HERE FOR FURTHER EVALUATION. NO SIGNS OF DISTRESS. RESPIRATIONS EVEN AND UNLABORED. AWAITING PROVIDER'S ASSESSMENT AND ORDERS. WILL CONTINUE TO MONITOR.

## 2021-08-30 NOTE — ED Triage Notes (Signed)
Was told all labs were critical that were drawn last week and needed to report to ER for further testing.

## 2021-08-30 NOTE — Consults (Signed)
[facility]  General Surgery  Consultation    Date of Service:  08/30/2021  Miko, Sirico, 46 y.o. male  Date of Admission:  08/30/2021  Date of Birth:  1975-09-15  PCP: Peri Maris, DO    Reason for Consultation:  Abnormal abdominal CT Scan       HPI:  Severin Bou is a 46 y.o. White male who presents to the [facility] ER secondary to abnormal labs obtained at his primary care provider's office with hypokalemia and leukocytosis.  By description was having mostly upper respiratory and sinus complaints and was found to have abnormalities by aforementioned laboratory workup.  Denied acute onset of abdominal pain or change in bowel habits.  No nausea or vomiting.  Had been tolerating a regular diet.  By history over the last year has been worked up by Dr. Posey Pronto for possible colitis with previous evaluations including colonoscopy performed.  Has persistent diarrhea with mucus in his stools and abdominal discomfort.  Reports at least 5-10 watery bowel movements a day.  No family history of colon cancer.  No family history of ulcerative colitis.  CT scan performed through the emergency department revealed findings consistent with sigmoid colitis and some diffuse thickening.  Also of note was with some fatty infiltration of the liver.  Has an elevated lactate on arrival.  Has received 2 L of fluid.  Repeat lactate has improved somewhat.  Denies worsening abdominal pain.  Currently is hungry.    Past Medical History:   Diagnosis Date   . Asthma    . Bleeding hemorrhoid    . Bleeding ulcer    . Cervical herniated disc    . COPD (chronic obstructive pulmonary disease) (CMS HCC)    . GERD (gastroesophageal reflux disease)    . Hyperlipidemia    . Hypertension    . Rotator cuff arthropathy, right    . Ulcerative colitis (CMS [place])       Past Surgical History:   Procedure Laterality Date   . HX CYST REMOVAL      lower back   . HX HERNIA REPAIR     . HX TONSILLECTOMY        Social History     Tobacco Use    . Smoking status: Every Day     Packs/day: 1.00     Years: 30.00     Pack years: 30.00     Types: Cigarettes   . Smokeless tobacco: Never   Vaping Use   . Vaping Use: Never used   Substance Use Topics   . Alcohol use: Yes     Comment: occasional   . Drug use: Never       Family Medical History:     Problem Relation (Age of Onset)    Anesthesia Complications Father    Arthritis-osteo Mother, Father    Asthma Mother, Father, Brother, Paternal Grandmother, Paternal Grandfather    Blood Clots Father, Paternal Grandmother, Paternal Grandfather    Cancer Mother, Father    Congestive Heart Failure Father, Paternal 58, Paternal Grandfather    Coronary Artery Disease Father, Paternal 27, Paternal Grandfather    Diabetes Mother, Father    Heart Attack Father, Paternal 34, Paternal Grandfather    High Cholesterol Mother, Father    Hypertension (High Blood Pressure) Mother, Father    Stroke Father, Maternal Grandmother, Maternal Grandfather, Paternal 76, Paternal Grandfather         Medications Prior to Admission     Prescriptions  albuterol sulfate (PROVENTIL OR VENTOLIN OR PROAIR) 90 mcg/actuation Inhalation oral inhaler    Take 1-2 Puffs by inhalation Every 6 hours as needed for Other (shortness of breath)    Ibuprofen (MOTRIN) 800 mg Oral Tablet    Take 1 Tablet (800 mg total) by mouth Three times a day as needed for Pain    methocarbamoL (ROBAXIN) 750 mg Oral Tablet    Take 1 Tablet (750 mg total) by mouth Three times a day    omeprazole (PRILOSEC) 40 mg Oral Capsule, Delayed Release(E.C.)    Take 1 Capsule (40 mg total) by mouth Once a day         Allergies   Allergen Reactions   . Flexeril [Cyclobenzaprine]    . Noctec [Chloral Hydrate]    . Penicillins    . Prednisone    . Theophylline           Patient Vitals for the past 24 hrs:   BP Temp Pulse Resp SpO2 Height Weight   08/30/21 1345 -- -- 96 16 94 % -- --   08/30/21 1330 137/87 -- 96 16 96 % -- --   08/30/21 1315 -- -- -- -- 95  % -- --   08/30/21 1300 -- -- -- -- 94 % -- --   08/30/21 1230 -- -- -- -- 96 % -- --   08/30/21 1215 -- -- -- -- 90 % -- --   08/30/21 1200 112/66 -- -- -- 92 % -- --   08/30/21 1145 105/69 -- -- -- 93 % -- --   08/30/21 1130 -- -- -- -- 90 % -- --   08/30/21 1030 132/70 36.4 C (97.5 F) 99 18 97 % 1.651 m ('5\' 5"' ) 84.4 kg (186 lb)          General: appropriate for age. in no acute distress.    Vital signs are present above and have been reviewed by me     HEENT: Atraumatic, Normocephalic.    Lungs: Nonlabored breathing with symmetric expansion    Heart:Regular wth respect to rate and rythmn.    Abdomen:Soft.  Mildly distended.  Minimal tenderness in the lower abdomen.  No peritoneal signs.  Nonsurgical abdomen    Psychiatric: Alert and oriented to person, place, and time. affect appropriate    Laboratory Data:     Results for orders placed or performed during the hospital encounter of 08/30/21 (from the past 24 hour(s))   RAPID THROAT SCREEN, STREPTOCOCCUS, WITH REFLEX    Specimen: Throat; Swab   Result Value Ref Range    THROAT RAPID SCREEN, STREPTOCOCCUS Negative Negative   COVID-19, FLU A/B, RSV RAPID BY PCR   Result Value Ref Range    SARS-CoV-2 Not Detected Not Detected    INFLUENZA VIRUS TYPE A Not Detected Not Detected    INFLUENZA VIRUS TYPE B Not Detected Not Detected    RESPIRATORY SYNCTIAL VIRUS (RSV) Not Detected Not Detected   COMPREHENSIVE METABOLIC PANEL, NON-FASTING   Result Value Ref Range    SODIUM 136 136 - 145 mmol/L    POTASSIUM 2.5 (LL) 3.5 - 5.1 mmol/L    CHLORIDE 93 (L) 98 - 107 mmol/L    CO2 TOTAL 27 21 - 31 mmol/L    ANION GAP 16 10 - 20 mmol/L    BUN 3 (L) 7 - 25 mg/dL    CREATININE 0.52 (L) 0.60 - 1.30 mg/dL    BUN/CREA RATIO 6 6 - 22    ESTIMATED  GFR 128 >59 mL/min/1.80m2    ALBUMIN 3.6 3.5 - 5.7 g/dL    CALCIUM 9.0 8.6 - 10.3 mg/dL    GLUCOSE 152 (H) 74 - 109 mg/dL    ALKALINE PHOSPHATASE 126 (H) 34 - 104 U/L    ALT (SGPT) 43 7 - 52 U/L    AST (SGOT) 171 (H) 13 - 39 U/L    BILIRUBIN  TOTAL 1.0 0.3 - 1.2 mg/dL    PROTEIN TOTAL 7.1 6.4 - 8.9 g/dL    ALBUMIN/GLOBULIN RATIO 1.0 0.8 - 1.4    OSMOLALITY, CALCULATED 272 270 - 290 mOsm/kg    CALCIUM, CORRECTED 9.4 8.9 - 10.8 mg/dL    GLOBULIN 3.5 2.9 - 5.4   LACTIC ACID LEVEL W/ REFLEX FOR LEVEL >2.0   Result Value Ref Range    LACTIC ACID 5.7 (HH) 0.5 - 2.2 mmol/L   MAGNESIUM   Result Value Ref Range    MAGNESIUM 1.9 1.9 - 2.7 mg/dL   PHOSPHORUS   Result Value Ref Range    PHOSPHORUS 2.7 (L) 3.7 - 7.2 mg/dL   SEDIMENTATION RATE   Result Value Ref Range    ERYTHROCYTE SEDIMENTATION RATE (ESR) 50 (H) <=15 mm/hr   C-REACTIVE PROTEIN (CRP)   Result Value Ref Range    C-REACTIVE PROTEIN (CRP) 3.4 (H) 0.1 - 0.5 mg/dL   CBC WITH DIFF   Result Value Ref Range    WBCS UNCORRECTED 16.6 x10^3/uL    WBC 16.6 (H) 4.0 - 10.5 x10^3/uL    RBC 3.93 (L) 4.20 - 6.00 x10^6/uL    HGB 13.5 13.5 - 18.0 g/dL    HCT 38.4 (L) 42.0 - 51.0 %    MCV 97.7 78.0 - 99.0 fL    MCH 34.3 (H) 27.0 - 32.0 pg    MCHC 35.1 32.0 - 36.0 g/dL    RDW 17.3 (H) 11.6 - 14.8 %    PLATELETS 535 (H) 140 - 440 x10^3/uL    MPV 6.9 (L) 7.4 - 10.4 fL    NEUTROPHIL % 74 40 - 76 %    LYMPHOCYTE % 15 (L) 25 - 45 %    MONOCYTE % 10 0 - 12 %    EOSINOPHIL % 1 0 - 7 %    BASOPHIL % 1 0 - 3 %    NEUTROPHIL # 12.20 4.10 - 29.00 x10^3/uL    LYMPHOCYTE # 2.40 2.10 - 11.00 x10^3/uL    MONOCYTE # 1.60 0.00 - 4.10 x10^3/uL    EOSINOPHIL # 0.20 0.00 - 2.40 x10^3/uL    BASOPHIL # 0.20 0.00 - 2.50 x10^3/uL   PT/INR   Result Value Ref Range    PROTHROMBIN TIME 13.0 (H) 9.8 - 12.7 seconds    INR 1.12 <=5.00   PTT (PARTIAL THROMBOPLASTIN TIME)   Result Value Ref Range    APTT 29.2 26.0 - 36.0 seconds   BLUE TOP TUBE   Result Value Ref Range    RAINBOW/EXTRA TUBE AUTO RESULT Yes    GOLD TOP TUBE   Result Value Ref Range    RAINBOW/EXTRA TUBE AUTO RESULT Yes    BLOOD CULTURE HOLD    Specimen: Blood   Result Value Ref Range    RAINBOW/EXTRA TUBE AUTO RESULT Yes    LACTIC ACID TIMED   Result Value Ref Range    LACTIC ACID 4.5  (HH) 0.5 - 2.2 mmol/L   URINALYSIS, MACROSCOPIC   Result Value Ref Range    COLOR Light Yellow Colorless, Light Yellow,  Yellow    APPEARANCE Clear Clear    SPECIFIC GRAVITY 1.006 1.002 - 1.030    PH 6.5 5.0 - 9.0    LEUKOCYTES Negative Negative, 100  WBCs/uL    NITRITE Negative Negative    PROTEIN Negative Negative, 10 , 20  mg/dL    GLUCOSE Negative Negative, 30  mg/dL    KETONES Negative Negative, Trace mg/dL    BILIRUBIN Negative Negative, 0.5 mg/dL    BLOOD Negative Negative mg/dL    UROBILINOGEN Normal Normal mg/dL   URINALYSIS, MICROSCOPIC   Result Value Ref Range    RBCS <1 <4 /hpf    WBCS 1 <6 /hpf    SQUAMOUS EPITHELIAL <1 <28 /hpf   ECG 12 LEAD   Result Value Ref Range    Ventricular rate 98 BPM    Atrial Rate 98 BPM    PR Interval 176 ms    QRS Duration 88 ms    QT Interval 384 ms    QTC Calculation 490 ms    Calculated P Axis 74 degrees    Calculated R Axis 79 degrees    Calculated T Axis 59 degrees       Imaging Studies:    CT ABDOMEN PELVIS W IV CONTRAST   Final Result by Edi, Radresults In (03/13 1434)   Diffuse hepatic steatosis with lower density areas seen throughout the periphery of the liver possibly indicating heterogenous fatty deposition. This could be further evaluated with a follow-up nonemergent MRI of the liver with and without contrast.      There is wall thickening involving the sigmoid colon which could represent colitis in the correct clinical setting. Please correlate with patient's clinical signs and symptoms. Follow-up colonoscopy is recommended if one has not been recently performed.         One or more dose reduction techniques were used (e.g., Automated exposure control, adjustment of the mA and/or kV according to patient size, use of iterative reconstruction technique).         Radiologist location ID: [id number]         XR CHEST PA AND LATERAL   Final Result by Edi, Radresults In (03/13 1320)   1. Mildly hyperexpanded lungs.   2. Borderline cardiomegaly.   3. No acute  cardiopulmonary process, infiltrates, consolidation or effusion.   4. No significant change since prior study.         Radiologist location ID: [id number]              Assessment/Plan:  Abnormal abdominal CT scan   History of colitis   Diarrhea   Hypokalemia      Plan:  At the current time does not exhibit findings consistent with an acute surgical process.  Has a long history of chronic abdominal pain with colitis and intermittent treatment through established gastroenterologist.  Etiology of the colitis is either inflammatory bowel disease or infectious.  Recommend IV antibiotics with GI consult and workup.  Serial labs to ensure lactate resolved.  No acute surgical process at the current time.  He is agreeable to the plan of care.  All questions answered.        This note was partially created using voice recognition software and is inherently subject to errors including those of syntax and "sound alike " substitutions which may escape proof reading. In such instances, original meaning may be extrapolated by contextual derivation.    Reinaldo Meeker, MD

## 2021-08-31 ENCOUNTER — Encounter (HOSPITAL_COMMUNITY): Payer: Self-pay | Admitting: Internal Medicine

## 2021-08-31 DIAGNOSIS — D649 Anemia, unspecified: Secondary | ICD-10-CM

## 2021-08-31 LAB — BASIC METABOLIC PANEL
ANION GAP: 7 mmol/L — ABNORMAL LOW (ref 10–20)
BUN/CREA RATIO: 6 (ref 6–22)
BUN: 3 mg/dL — ABNORMAL LOW (ref 7–25)
CALCIUM: 8.3 mg/dL — ABNORMAL LOW (ref 8.6–10.3)
CHLORIDE: 100 mmol/L (ref 98–107)
CO2 TOTAL: 31 mmol/L (ref 21–31)
CREATININE: 0.49 mg/dL — ABNORMAL LOW (ref 0.60–1.30)
ESTIMATED GFR: 131 mL/min/{1.73_m2} (ref >59)
GLUCOSE: 126 mg/dL — ABNORMAL HIGH (ref 74–109)
OSMOLALITY, CALCULATED: 274 mOsm/kg (ref 270–290)
POTASSIUM: 3.2 mmol/L — ABNORMAL LOW (ref 3.5–5.1)
SODIUM: 138 mmol/L (ref 136–145)

## 2021-08-31 LAB — CBC WITH DIFF
BASOPHIL #: 0.1 10*3/uL (ref 0.00–2.50)
BASOPHIL %: 1 % (ref 0–3)
EOSINOPHIL #: 0.2 10*3/uL (ref 0.00–2.40)
EOSINOPHIL %: 1 % (ref 0–7)
HCT: 35.1 % — ABNORMAL LOW (ref 42.0–51.0)
HGB: 12.1 g/dL — ABNORMAL LOW (ref 13.5–18.0)
LYMPHOCYTE #: 2.6 10*3/uL (ref 2.10–11.00)
LYMPHOCYTE %: 20 % — ABNORMAL LOW (ref 25–45)
MCH: 33.9 pg — ABNORMAL HIGH (ref 27.0–32.0)
MCHC: 34.4 g/dL (ref 32.0–36.0)
MCV: 98.4 fL (ref 78.0–99.0)
MONOCYTE #: 1.2 10*3/uL (ref 0.00–4.10)
MONOCYTE %: 9 % (ref 0–12)
MPV: 6.7 fL — ABNORMAL LOW (ref 7.4–10.4)
NEUTROPHIL #: 8.7 10*3/uL (ref 4.10–29.00)
NEUTROPHIL %: 68 % (ref 40–76)
PLATELETS: 503 10*3/uL — ABNORMAL HIGH (ref 140–440)
RBC: 3.57 10*6/uL — ABNORMAL LOW (ref 4.20–6.00)
RDW: 17.2 % — ABNORMAL HIGH (ref 11.6–14.8)
WBC: 12.8 10*3/uL — ABNORMAL HIGH (ref 4.0–10.5)
WBCS UNCORRECTED: 12.8 10*3/uL

## 2021-08-31 LAB — THYROID STIMULATING HORMONE WITH FREE T4 REFLEX: TSH: 4.056 u[IU]/mL (ref 0.450–5.330)

## 2021-08-31 LAB — THROAT CULTURE, BETA HEMOLYTIC STREPTOCOCCUS: THROAT CULTURE: NORMAL

## 2021-08-31 LAB — LACTIC ACID LEVEL: LACTIC ACID: 2.4 mmol/L — ABNORMAL HIGH (ref 0.5–2.2)

## 2021-08-31 MED ORDER — METHYLPREDNISOLONE SOD SUCCINATE 40 MG/ML SOLUTION FOR INJ. WRAPPER
20.0000 mg | Freq: Three times a day (TID) | INTRAMUSCULAR | Status: DC
Start: 2021-08-31 — End: 2021-09-06
  Administered 2021-08-31 – 2021-09-06 (×18): 20 mg via INTRAVENOUS
  Filled 2021-08-31 (×18): qty 1

## 2021-08-31 MED ORDER — NICOTINE 21 MG/24 HR DAILY TRANSDERMAL PATCH
MEDICATED_PATCH | TRANSDERMAL | Status: AC
Start: 2021-08-31 — End: 2021-08-31
  Filled 2021-08-31: qty 1

## 2021-08-31 MED ORDER — FOLIC ACID 1 MG TABLET
ORAL_TABLET | ORAL | Status: AC
Start: 2021-08-31 — End: 2021-08-31
  Filled 2021-08-31: qty 1

## 2021-08-31 MED ORDER — METRONIDAZOLE 500 MG/100 ML IN SODIUM CHLOR(ISO) INTRAVENOUS PIGGYBACK
INJECTION | INTRAVENOUS | Status: AC
Start: 2021-08-31 — End: 2021-08-31
  Filled 2021-08-31: qty 100

## 2021-08-31 MED ORDER — METHOCARBAMOL 750 MG TABLET
ORAL_TABLET | ORAL | Status: AC
Start: 2021-08-31 — End: 2021-08-31
  Filled 2021-08-31: qty 1

## 2021-08-31 MED ORDER — PANTOPRAZOLE 40 MG INTRAVENOUS SOLUTION
INTRAVENOUS | Status: AC
Start: 2021-08-31 — End: 2021-08-31
  Filled 2021-08-31: qty 10

## 2021-08-31 NOTE — Nurses Notes (Signed)
Patient arrived to unit from ED. Admission completed, VSS per flow sheet, assessment completed. Patient home medication placed in medication tower at nurses station to be given to patient's wife when she comes tomorrow 3/15. Informed patient we will need stool sample.

## 2021-08-31 NOTE — Consults (Signed)
[facility]  Gastroenterology/ Hepatology Consult Note    Emidio, George, 46 y.o. male  Encounter Start Date:  08/30/2021  Inpatient Admission Date: 08/30/2021   Date of Birth:  10-02-1975    Referring physician:  No ref. provider found    Chief Complaint: abdominal pain    Raynell [name] is a 46 y.o., White male who presents with abdominal pain and diarrhea. Pt seen in ER. He is known to [place] from April of last year for evaluation of elevated LFTs. He does drink 3-4 beers daily in the evenings. Hepatitis screening was negative. CT noted which he does have have fatty liver and colitis of sigmoid. Nurse reports patient has refused IVF. He had EGD and colonoscopy by Dr. Georgiann Cocker within the year  which showed hiatal hernia and external hemorrhoids. He states he has been on ulcerative colitis medication in the past and didn't help which he stopped taking them. He was taking Budesonide.        REVIEW OF SYSTEMS:    Review of Systems was completed with pertinent ROS as addressed in HPI.            Medications Prior to Admission     Prescriptions    albuterol sulfate (PROVENTIL OR VENTOLIN OR PROAIR) 90 mcg/actuation Inhalation oral inhaler    Take 1-2 Puffs by inhalation Every 6 hours as needed for Other (shortness of breath)    Ibuprofen (MOTRIN) 800 mg Oral Tablet    Take 1 Tablet (800 mg total) by mouth Three times a day as needed for Pain    methocarbamoL (ROBAXIN) 750 mg Oral Tablet    Take 1 Tablet (750 mg total) by mouth Three times a day    omeprazole (PRILOSEC) 40 mg Oral Capsule, Delayed Release(E.C.)    Take 1 Capsule (40 mg total) by mouth Once a day           Allergies:    Allergies   Allergen Reactions   . Flexeril [Cyclobenzaprine]    . Noctec [Chloral Hydrate]    . Penicillins    . Prednisone    . Theophylline          ED medications:   Medications Administered in the ED   methocarbamol (ROBAXIN) tablet (has no administration in time range)    albuterol (PROVENTIL) 2.5 mg / 3 mL (0.083%) neb solution (has no administration in time range)   albuterol (PROVENTIL) 2.5 mg / 3 mL (0.083%) neb solution (has no administration in time range)   acetaminophen (TYLENOL) tablet (has no administration in time range)   aluminum-magnesium hydroxide-simethicone (MAG-AL PLUS) 200-200-20 mg per 5 mL oral liquid (has no administration in time range)   folic acid (FOLVITE) tablet (has no administration in time range)   prochlorperazine (COMPAZINE) 5 mg/mL injection (has no administration in time range)   ondansetron (ZOFRAN) 2 mg/mL injection (has no administration in time range)   NS premix infusion (has no administration in time range)   pantoprazole (PROTONIX) injection (has no administration in time range)   nicotine (NICODERM CQ) transdermal patch (mg/24 hr) (has no administration in time range)   levoFLOXacin (LEVAQUIN) 500 mg in D5W 100 mL premix IVPB (has no administration in time range)   metroNIDAZOLE (FLAGYL) 500 mg in NS 100 mL premix IVPB (has no administration in time range)   morphine 4 mg/mL injection (has no administration in time range)   oxyCODONE-acetaminophen (PERCOCET) 5-'325mg'$  per tablet (has no administration in time range)  LORazepam (ATIVAN) 2 mg/mL injection (has no administration in time range)   guaiFENesin '100mg'$  per 73m oral liquid - for cough (expectorant) (has no administration in time range)   LR bolus infusion 1,000 mL (0 mL Intravenous Stopped 08/30/21 1407)   potassium chloride 20 mEq in SW 100 mL premix infusion (0 mEq Intravenous Stopped 08/30/21 1440)   potassium bicarbonate-citric acid (EFFER-K) effervescent tablet (25 mEq Oral Given 08/30/21 1340)   LR bolus infusion 1,000 mL (0 mL Intravenous Stopped 08/30/21 1549)   iohexol (OMNIPAQUE 350) infusion (100 mL Intravenous Given 08/30/21 1418)   potassium chloride 20 mEq in SW 100 mL premix infusion (has no administration in time range)   potassium chloride (K-DUR) extended release tablet  (has no administration in time range)         Home Meds:      Prior to Admission medications    Medication Sig Start Date End Date Taking? Authorizing Provider   albuterol sulfate (PROVENTIL OR VENTOLIN OR PROAIR) 90 mcg/actuation Inhalation oral inhaler Take 1-2 Puffs by inhalation Every 6 hours as needed for Other (shortness of breath)   Yes Provider, Historical   Ibuprofen (MOTRIN) 800 mg Oral Tablet Take 1 Tablet (800 mg total) by mouth Three times a day as needed for Pain   Yes Provider, Historical   methocarbamoL (ROBAXIN) 750 mg Oral Tablet Take 1 Tablet (750 mg total) by mouth Three times a day   Yes Provider, Historical   omeprazole (PRILOSEC) 40 mg Oral Capsule, Delayed Release(E.C.) Take 1 Capsule (40 mg total) by mouth Once a day   Yes Provider, Historical   budesonide (ENTOCORT EC) 3 mg Oral Capsule, Delayed & Ext.Release  09/24/20 08/30/21  Provider, Historical   ergocalciferol, vitamin D2, (DRISDOL) 1,250 mcg (50,000 unit) Oral Capsule  08/14/20 08/30/21  Provider, Historical   gabapentin (NEURONTIN) 300 mg Oral Capsule Take 300 mg by mouth Pt doesn't take as he should  08/30/21  Provider, Historical   Ibuprofen (MOTRIN) 200 mg Oral Tablet Take 200 mg by mouth Four times a day as needed for Pain  08/30/21  Provider, Historical   omeprazole (PRILOSEC) 40 mg Oral Capsule, Delayed Release(E.C.)  08/26/20 08/30/21  Provider, Historical   sucralfate (CARAFATE) 1 gram Oral Tablet  09/22/20 08/30/21  Provider, Historical          Inpatient Meds:      acetaminophen (TYLENOL) tablet, 650 mg, Oral, Q4H PRN  albuterol (PROVENTIL) 2.5 mg / 3 mL (0.083%) neb solution, 2.5 mg, Nebulization, Q6H  albuterol (PROVENTIL) 2.5 mg / 3 mL (0.083%) neb solution, 2.5 mg, Nebulization, Q4H PRN  aluminum-magnesium hydroxide-simethicone (MAG-AL PLUS) 200-200-20 mg per 5 mL oral liquid, 10 mL, Oral, QM3NPRN  folic acid (FOLVITE) tablet, 1 mg, Oral, Daily  guaiFENesin '100mg'$  per 566moral liquid - for cough (expectorant), 200 mg, Oral, Q4H  PRN  levoFLOXacin (LEVAQUIN) 500 mg in D5W 100 mL premix IVPB, 500 mg, Intravenous, Q24H  LORazepam (ATIVAN) 2 mg/mL injection, 0.5 mg, Intravenous, Q4H PRN  methocarbamol (ROBAXIN) tablet, 750 mg, Oral, 3x/day  metroNIDAZOLE (FLAGYL) 500 mg in NS 100 mL premix IVPB, 500 mg, Intravenous, Q12H  morphine 4 mg/mL injection, 4 mg, Intravenous, Q4H PRN  nicotine (NICODERM CQ) transdermal patch (mg/24 hr), 21 mg, Transdermal, Daily  NS premix infusion, , Intravenous, Continuous  ondansetron (ZOFRAN) 2 mg/mL injection, 4 mg, Intravenous, Q6H PRN  oxyCODONE-acetaminophen (PERCOCET) 5-'325mg'$  per tablet, 1 Tablet, Oral, Q4H PRN  pantoprazole (PROTONIX) injection, 40 mg, Intravenous, Daily  prochlorperazine (COMPAZINE) 5 mg/mL injection, 10 mg, Intravenous, Q6H PRN             PMHx:    Past Medical History:   Diagnosis Date   . Asthma    . Bleeding hemorrhoid    . Bleeding ulcer    . Cervical herniated disc    . COPD (chronic obstructive pulmonary disease) (CMS HCC)    . GERD (gastroesophageal reflux disease)    . Hyperlipidemia    . Hypertension    . Rotator cuff arthropathy, right    . Ulcerative colitis (CMS [place])         PSHx:   Past Surgical History:   Procedure Laterality Date   . HX CYST REMOVAL      lower back   . HX HERNIA REPAIR     . HX TONSILLECTOMY            Family History  Family Medical History:     Problem Relation (Age of Onset)    Anesthesia Complications Father    Arthritis-osteo Mother, Father    Asthma Mother, Father, Brother, Paternal Grandmother, Paternal Grandfather    Blood Clots Father, Paternal Grandmother, Paternal Grandfather    Cancer Mother, Father    Congestive Heart Failure Father, Paternal 27, Paternal Grandfather    Coronary Artery Disease Father, Paternal 62, Paternal Grandfather    Diabetes Mother, Father    Heart Attack Father, Paternal 52, Paternal Grandfather    High Cholesterol Mother, Father    Hypertension (High Blood Pressure) Mother, Father    Stroke  Father, Maternal Grandmother, Maternal Grandfather, Paternal 85, Paternal Grandfather         Social History  Social History     Tobacco Use   . Smoking status: Every Day     Packs/day: 1.00     Years: 30.00     Pack years: 30.00     Types: Cigarettes   . Smokeless tobacco: Never   Vaping Use   . Vaping Use: Never used   Substance Use Topics   . Alcohol use: Yes     Comment: occasional   . Drug use: Never                   Vital Signs:  Temp (24hrs) Max:36.7 C (37.6 F)      Systolic (28BTD), VVO:160 , Min:118 , VPX:106     Diastolic (26RSW), NIO:27, Min:80, Max:100    Temp  Avg: 36.7 C (98.1 F)  Min: 36.7 C (98.1 F)  Max: 36.7 C (98.1 F)  MAP (Non-Invasive)  Avg: 103.7 mmHG  Min: 93 mmHG  Max: 116 mmHG  Pulse  Avg: 96.7  Min: 86  Max: 112  Resp  Avg: 16.9  Min: 12  Max: 33  SpO2  Avg: 94.3 %  Min: 86 %  Max: 100 %           PHYSICAL EXAM:   Temperature: 36.7 C (98.1 F)  Heart Rate: 90  BP (Non-Invasive): 138/85  Respiratory Rate: 16  SpO2: 95 %  Constitutional:  no distress  Respiratory:  Clear to auscultation bilaterally.   Gastrointestinal:  Bowel sounds normal, tenderness in LLQ     RELEVANT LABS:  Labs:    Results for orders placed or performed during the hospital encounter of 08/30/21 (from the past 24 hour(s))   BASIC METABOLIC PANEL, NON-FASTING   Result Value Ref Range    SODIUM 138 136 - 145 mmol/L  POTASSIUM 3.2 (L) 3.5 - 5.1 mmol/L    CHLORIDE 100 98 - 107 mmol/L    CO2 TOTAL 31 21 - 31 mmol/L    ANION GAP 7 (L) 10 - 20 mmol/L    CALCIUM 8.3 (L) 8.6 - 10.3 mg/dL    GLUCOSE 126 (H) 74 - 109 mg/dL    BUN 3 (L) 7 - 25 mg/dL    CREATININE 0.49 (L) 0.60 - 1.30 mg/dL    BUN/CREA RATIO 6 6 - 22    ESTIMATED GFR 131 >59 mL/min/1.65m2    OSMOLALITY, CALCULATED 274 270 - 290 mOsm/kg   THYROID STIMULATING HORMONE WITH FREE T4 REFLEX   Result Value Ref Range    TSH 4.056 0.450 - 5.330 uIU/mL   LACTIC ACID LEVEL   Result Value Ref Range    LACTIC ACID 2.4 (H) 0.5 - 2.2 mmol/L   CBC WITH DIFF    Result Value Ref Range    WBCS UNCORRECTED 12.8 x10^3/uL    WBC 12.8 (H) 4.0 - 10.5 x10^3/uL    RBC 3.57 (L) 4.20 - 6.00 x10^6/uL    HGB 12.1 (L) 13.5 - 18.0 g/dL    HCT 35.1 (L) 42.0 - 51.0 %    MCV 98.4 78.0 - 99.0 fL    MCH 33.9 (H) 27.0 - 32.0 pg    MCHC 34.4 32.0 - 36.0 g/dL    RDW 17.2 (H) 11.6 - 14.8 %    PLATELETS 503 (H) 140 - 440 x10^3/uL    MPV 6.7 (L) 7.4 - 10.4 fL    NEUTROPHIL % 68 40 - 76 %    LYMPHOCYTE % 20 (L) 25 - 45 %    MONOCYTE % 9 0 - 12 %    EOSINOPHIL % 1 0 - 7 %    BASOPHIL % 1 0 - 3 %    NEUTROPHIL # 8.70 4.10 - 29.00 x10^3/uL    LYMPHOCYTE # 2.60 2.10 - 11.00 x10^3/uL    MONOCYTE # 1.20 0.00 - 4.10 x10^3/uL    EOSINOPHIL # 0.20 0.00 - 2.40 x10^3/uL    BASOPHIL # 0.10 0.00 - 2.50 x10^3/uL        No results found for: CARCIEMBAG, MQAD24, CAAGGICA199, CA199   CBC  Diff   Lab Results   Component Value Date/Time    WBC 12.8 (H) 08/31/2021 06:04 AM    HGB 12.1 (L) 08/31/2021 06:04 AM    HCT 35.1 (L) 08/31/2021 06:04 AM    PLTCNT 503 (H) 08/31/2021 06:04 AM    RBC 3.57 (L) 08/31/2021 06:04 AM    MCV 98.4 08/31/2021 06:04 AM    MCHC 34.4 08/31/2021 06:04 AM    MCH 33.9 (H) 08/31/2021 06:04 AM    RDW 17.2 (H) 08/31/2021 06:04 AM    MPV 6.7 (L) 08/31/2021 06:04 AM    Lab Results   Component Value Date/Time    PMNS 68 08/31/2021 06:04 AM    LYMPHOCYTES 20 (L) 08/31/2021 06:04 AM    EOSINOPHIL 1 08/31/2021 06:04 AM    MONOCYTES 9 08/31/2021 06:04 AM    BASOPHILS 1 08/31/2021 06:04 AM    BASOPHILS 0.10 08/31/2021 06:04 AM    PMNABS 8.70 08/31/2021 06:04 AM    LYMPHSABS 2.60 08/31/2021 06:04 AM    EOSABS 0.20 08/31/2021 06:04 AM    MONOSABS 1.20 08/31/2021 06:04 AM           No results found for: CARCIEMBAG     Imaging Studies:  No results  found for this or any previous visit (from the past 416606301 hour(s)).  No results found for this or any previous visit (from the past 601093235 hour(s)).  Recent Results (from the past 573220254 hour(s))   CT ABDOMEN PELVIS W IV CONTRAST    Collection Time:  08/30/21  2:15 PM    Narrative    SR. Amaro ANDREW Defilippo    RADIOLOGIST: Tempie Donning    CT ABDOMEN PELVIS W IV CONTRAST performed on 08/30/2021 2:15 PM    CLINICAL HISTORY: nausea, chronic diarrhea.  vomiting/diarrhea     TECHNIQUE:  Abdomen and pelvis CT with intravenous contrast.  IV CONTRAST: 100 ml's of Omnipaque 350    COMPARISON:  11/26/2016  # of known CTs in the past 12 months: 0   # of known Cardiac Nuclear Medicine Studies in the past 12 months: 0    FINDINGS:  Lung bases: Clear    Liver:   The liver is enlarged. There is diffuse hepatic steatosis with lower density areas seen throughout the periphery.    Gallbladder:   Unremarkable.    Spleen:   Unremarkable.    Pancreas:   Unremarkable.    Adrenals:   Unremarkable.    Kidneys:   Stable cyst in the right kidney. No hydronephrosis.    Bladder:  Unremarkable.  Prostate:  Unremarkable.    Bowel:   There is wall thickening involving the sigmoid colon. There is some submucosal fatty infiltration throughout portions of the descending colon. There is no large or small bowel dilatation.    Appendix:  Normal.    Lymph nodes:  No suspicious lymph node enlargement.    Vasculature:   Mild diffuse atherosclerotic calcifications are noted.     Peritoneum / Retroperitoneum: No ascites.  No free air.    Bones:   Unremarkable.          Impression    Diffuse hepatic steatosis with lower density areas seen throughout the periphery of the liver possibly indicating heterogenous fatty deposition. This could be further evaluated with a follow-up nonemergent MRI of the liver with and without contrast.    There is wall thickening involving the sigmoid colon which could represent colitis in the correct clinical setting. Please correlate with patient's clinical signs and symptoms. Follow-up colonoscopy is recommended if one has not been recently performed.      One or more dose reduction techniques were used (e.g., Automated exposure control, adjustment of the mA and/or kV  according to patient size, use of iterative reconstruction technique).      Radiologist location ID: [id number]       No results found for this or any previous visit (from the past 270623762 hour(s)).  No results found for this or any previous visit (from the past 831517616 hour(s)).  No results found for this or any previous visit (from the past 073710626 hour(s)).    Assessment/Plan and Recommendations:    Continue current management and supportive care.  Continue antibiotics. Will order CDT and stool culture to evaluate diarrhea.  Will need outpatient flex sigmoidoscopy in the near future if he agrees. Discussed with Dr.K.Patel and treatment by him.   Park Pope, NP-C

## 2021-08-31 NOTE — Care Plan (Signed)
Patient alert and oriented x 4. Patient lungs diminished and respirations easy non labored on room air. Patient getting scheduled breathing treatment. Patient has infrequent cough. Patient abdomen firm  non-tender and BS active. Patient report having multiple bowel movements today. Patient made aware stool sample is needed. Patient encouraged to reposition frequently to prevent skin breakdown. Patient up independently in room. Patient voiding with out difficulty. Patient on normal saline at 125 mL/hr. Patient on clear liquid diet and tolerating well. Bed in lowest position and call light within reach. Patient able to make needs known.  Problem: Pain Chronic (Persistent) (Comorbidity Management)  Goal: Acceptable Pain Control and Functional Ability  Outcome: Ongoing (see interventions/notes)  Intervention: Optimize Psychosocial Wellbeing  Recent Flowsheet Documentation  Taken 08/31/2021 1616 by Argentina Donovan, RN  Diversional Activities:   television   smartphone

## 2021-08-31 NOTE — ED Nurses Note (Signed)
Pt states that he does not want any more iv fluids at this time.

## 2021-08-31 NOTE — Progress Notes (Signed)
08/31/2021  19:50  [name]  [id number]    History: 46 yr old male who presented to the hospital with abdominal pain and worsening diarrhea.     Today: He states he is still having bloody mucous stools. GI evaluated but states they haven't actually seen him in their office for UC but for elevated LFTs.     Review of systems:  10 point review of systems was reviewed and negative except as pertinent negatives and positives mentioned in the interval history above.    Past Medical History  Ibuprofen, albuterol sulfate, azithromycin, gabapentin, methocarbamoL, omeprazole, and promethazine   Allergies   Allergen Reactions   . Noctec [Chloral Hydrate] Anaphylaxis   . Penicillins Anaphylaxis   . Theophylline Shortness of Breath   . Flexeril [Cyclobenzaprine] Mental Status Effect     Homicidal ideation   . Prednisone Myalgia     Past Medical History:   Diagnosis Date   . Asthma    . Bleeding hemorrhoid    . Bleeding ulcer    . Cervical herniated disc    . COPD (chronic obstructive pulmonary disease) (CMS HCC)    . GERD (gastroesophageal reflux disease)    . Hx MRSA infection    . Hyperlipidemia    . Hypertension    . Rotator cuff arthropathy, right    . Ulcerative colitis (CMS [place])          Past Surgical History:   Procedure Laterality Date   . HX CYST REMOVAL      lower back   . HX HERNIA REPAIR     . HX TONSILLECTOMY           Family Medical History:     Problem Relation (Age of Onset)    Anesthesia Complications Father    Arthritis-osteo Mother, Father    Asthma Mother, Father, Brother, Paternal Grandmother, Paternal Grandfather    Blood Clots Father, Paternal Grandmother, Paternal Grandfather    Cancer Mother, Father    Congestive Heart Failure Father, Paternal 19, Paternal Grandfather    Coronary Artery Disease Father, Paternal 29, Paternal Grandfather    Diabetes Mother, Father    Heart Attack Father, Paternal 27, Paternal Grandfather    High Cholesterol Mother, Father     Hypertension (High Blood Pressure) Mother, Father    Stroke Father, Maternal Grandmother, Maternal Grandfather, Paternal 2, Paternal Grandfather          Social History     Socioeconomic History   . Marital status: Married   Tobacco Use   . Smoking status: Every Day     Packs/day: 1.00     Years: 30.00     Pack years: 30.00     Types: Cigarettes   . Smokeless tobacco: Never   Vaping Use   . Vaping Use: Never used   Substance and Sexual Activity   . Alcohol use: Yes     Comment: occasional   . Drug use: Never       Physical exam:  BP 137/89   Pulse 96   Temp 36.9 C (98.5 F)   Resp 18   Ht 1.676 m (_0 )   Wt 83.6 kg (184 lb 4.8 oz)   SpO2 98%   BMI 29.75 kg/m       Gen: This is a 46  Year-old male  who is awake alert and oriented x3 in no acute distress  CV:  Regular rate and rhythm without murmurs rubs or gallops.  2+ pedal  and radial pulses. No clubbing, cyanosis, or edema.  RESP:  Clear to auscultation bilaterally without wheezes rales or rhonchi.  Respirations are nonlabored.  GI:  Abdomen is soft, nontender, nondistended.  Bowel sounds normoactive.  GU: No foley is present  MSK:  Full range of motion of upper and lower extremities  NEURO:  Cranial nerves 2-12 grossly intact.  No focal deficits as tested.  SKIN: Warm, dry, intact, without lesions, rashes, or ulcerations    Diagnostic Labs:  Results for orders placed or performed during the hospital encounter of 08/30/21 (from the past 24 hour(s))   CBC/DIFF    Narrative    The following orders were created for panel order CBC/DIFF.  Procedure                               Abnormality         Status                     ---------                               -----------         ------                     CBC WITH DIFF[[id number]]                Abnormal            Final result                 Please view results for these tests on the individual orders.   BASIC METABOLIC PANEL, NON-FASTING   Result Value Ref Range    SODIUM 138 136 - 145 mmol/L     POTASSIUM 3.2 (L) 3.5 - 5.1 mmol/L    CHLORIDE 100 98 - 107 mmol/L    CO2 TOTAL 31 21 - 31 mmol/L    ANION GAP 7 (L) 10 - 20 mmol/L    CALCIUM 8.3 (L) 8.6 - 10.3 mg/dL    GLUCOSE 126 (H) 74 - 109 mg/dL    BUN 3 (L) 7 - 25 mg/dL    CREATININE 0.49 (L) 0.60 - 1.30 mg/dL    BUN/CREA RATIO 6 6 - 22    ESTIMATED GFR 131 >59 mL/min/1.47m2    OSMOLALITY, CALCULATED 274 270 - 290 mOsm/kg    Narrative    Estimated Glomerular Filtration Rate (eGFR) is calculated using the CKD-EPI (2021) equation, intended for patients 172years of age and older. If gender is not documented or "unknown", there will be no eGFR calculation.   THYROID STIMULATING HORMONE WITH FREE T4 REFLEX   Result Value Ref Range    TSH 4.056 0.450 - 5.330 uIU/mL   LACTIC ACID LEVEL   Result Value Ref Range    LACTIC ACID 2.4 (H) 0.5 - 2.2 mmol/L   CBC WITH DIFF   Result Value Ref Range    WBCS UNCORRECTED 12.8 x10^3/uL    WBC 12.8 (H) 4.0 - 10.5 x10^3/uL    RBC 3.57 (L) 4.20 - 6.00 x10^6/uL    HGB 12.1 (L) 13.5 - 18.0 g/dL    HCT 35.1 (L) 42.0 - 51.0 %    MCV 98.4 78.0 - 99.0 fL    MCH 33.9 (H) 27.0 - 32.0 pg    MCHC 34.4 32.0 -  36.0 g/dL    RDW 17.2 (H) 11.6 - 14.8 %    PLATELETS 503 (H) 140 - 440 x10^3/uL    MPV 6.7 (L) 7.4 - 10.4 fL    NEUTROPHIL % 68 40 - 76 %    LYMPHOCYTE % 20 (L) 25 - 45 %    MONOCYTE % 9 0 - 12 %    EOSINOPHIL % 1 0 - 7 %    BASOPHIL % 1 0 - 3 %    NEUTROPHIL # 8.70 4.10 - 29.00 x10^3/uL    LYMPHOCYTE # 2.60 2.10 - 11.00 x10^3/uL    MONOCYTE # 1.20 0.00 - 4.10 x10^3/uL    EOSINOPHIL # 0.20 0.00 - 2.40 x10^3/uL    BASOPHIL # 0.10 0.00 - 2.50 x10^3/uL       Diagnostic Imaging:  CT ABDOMEN PELVIS W IV CONTRAST   Final Result   Diffuse hepatic steatosis with lower density areas seen throughout the periphery of the liver possibly indicating heterogenous fatty deposition. This could be further evaluated with a follow-up nonemergent MRI of the liver with and without contrast.      There is wall thickening involving the sigmoid colon which  could represent colitis in the correct clinical setting. Please correlate with patient's clinical signs and symptoms. Follow-up colonoscopy is recommended if one has not been recently performed.         One or more dose reduction techniques were used (e.g., Automated exposure control, adjustment of the mA and/or kV according to patient size, use of iterative reconstruction technique).         Radiologist location ID: [id number]         XR CHEST PA AND LATERAL   Final Result   1. Mildly hyperexpanded lungs.   2. Borderline cardiomegaly.   3. No acute cardiopulmonary process, infiltrates, consolidation or effusion.   4. No significant change since prior study.         Radiologist location ID: [id number]               Assesment/Plan:  Patient Active Problem List   Diagnosis   . Leucocytosis   . Anemia   . Hypokalemia   . Colitis   . Continuous tobacco abuse   . HTN (hypertension)   . Lactic acidosis   . COPD (chronic obstructive pulmonary disease) (CMS HCC)        Ulcerative Colitis Flare  Cont with ABX and will start solumedrol  Appreciate GI and surgery recommendations      DVT Prophylaxis: SCDs  Diet: Clear Liquids  Physical Therapy: Not indicated  Disposition: Home    On the day of the encounter, a total of  35 minutes was spent on this patient encounter including review of historical information, examination, documentation and post-visit activities. The time documented excludes procedural time.    Celene Kras, DO

## 2021-08-31 NOTE — ED Nurses Note (Signed)
Pt was placed on 2L O2 via NC at this time.  Pt stated he does have sleep apnea but was never set up with a CPAP. Pt stat went to 95% immediately after O2 on.

## 2021-09-01 DIAGNOSIS — F17200 Nicotine dependence, unspecified, uncomplicated: Secondary | ICD-10-CM | POA: Insufficient documentation

## 2021-09-01 DIAGNOSIS — K51911 Ulcerative colitis, unspecified with rectal bleeding: Secondary | ICD-10-CM

## 2021-09-01 DIAGNOSIS — E878 Other disorders of electrolyte and fluid balance, not elsewhere classified: Secondary | ICD-10-CM

## 2021-09-01 LAB — CBC WITH DIFF
BASOPHIL #: 0 10*3/uL (ref 0.00–2.50)
BASOPHIL %: 0 % (ref 0–3)
EOSINOPHIL #: 0 10*3/uL (ref 0.00–2.40)
EOSINOPHIL %: 0 % (ref 0–7)
HCT: 35.8 % — ABNORMAL LOW (ref 42.0–51.0)
HGB: 12.2 g/dL — ABNORMAL LOW (ref 13.5–18.0)
LYMPHOCYTE #: 1.5 10*3/uL — ABNORMAL LOW (ref 2.10–11.00)
LYMPHOCYTE %: 10 % — ABNORMAL LOW (ref 25–45)
MCH: 33.8 pg — ABNORMAL HIGH (ref 27.0–32.0)
MCHC: 34 g/dL (ref 32.0–36.0)
MCV: 99.2 fL — ABNORMAL HIGH (ref 78.0–99.0)
MONOCYTE #: 0.9 10*3/uL (ref 0.00–4.10)
MONOCYTE %: 6 % (ref 0–12)
MPV: 6.9 fL — ABNORMAL LOW (ref 7.4–10.4)
NEUTROPHIL #: 11.8 10*3/uL (ref 4.10–29.00)
NEUTROPHIL %: 83 % — ABNORMAL HIGH (ref 40–76)
PLATELETS: 472 10*3/uL — ABNORMAL HIGH (ref 140–440)
RBC: 3.61 10*6/uL — ABNORMAL LOW (ref 4.20–6.00)
RDW: 17.3 % — ABNORMAL HIGH (ref 11.6–14.8)
WBC: 14.2 10*3/uL — ABNORMAL HIGH (ref 4.0–10.5)
WBCS UNCORRECTED: 14.2 10*3/uL

## 2021-09-01 LAB — COMPREHENSIVE METABOLIC PANEL, NON-FASTING
ALBUMIN/GLOBULIN RATIO: 1.1 (ref 0.8–1.4)
ALBUMIN: 3.4 g/dL — ABNORMAL LOW (ref 3.5–5.7)
ALKALINE PHOSPHATASE: 97 U/L (ref 34–104)
ALT (SGPT): 29 U/L (ref 7–52)
ANION GAP: 8 mmol/L — ABNORMAL LOW (ref 10–20)
AST (SGOT): 54 U/L — ABNORMAL HIGH (ref 13–39)
BILIRUBIN TOTAL: 0.9 mg/dL (ref 0.3–1.2)
BUN/CREA RATIO: 4 — ABNORMAL LOW (ref 6–22)
BUN: 2 mg/dL — ABNORMAL LOW (ref 7–25)
CALCIUM, CORRECTED: 9 mg/dL (ref 8.9–10.8)
CALCIUM: 8.4 mg/dL — ABNORMAL LOW (ref 8.6–10.3)
CHLORIDE: 100 mmol/L (ref 98–107)
CO2 TOTAL: 28 mmol/L (ref 21–31)
CREATININE: 0.56 mg/dL — ABNORMAL LOW (ref 0.60–1.30)
ESTIMATED GFR: 125 mL/min/{1.73_m2} (ref >59)
GLOBULIN: 3.2 (ref 2.9–5.4)
GLUCOSE: 171 mg/dL — ABNORMAL HIGH (ref 74–109)
OSMOLALITY, CALCULATED: 272 mOsm/kg (ref 270–290)
POTASSIUM: 3.9 mmol/L (ref 3.5–5.1)
PROTEIN TOTAL: 6.6 g/dL (ref 6.4–8.9)
SODIUM: 136 mmol/L (ref 136–145)

## 2021-09-01 LAB — MAGNESIUM: MAGNESIUM: 1.7 mg/dL — ABNORMAL LOW (ref 1.9–2.7)

## 2021-09-01 LAB — C. DIFFICILE PCR
C. DIFFICILE TOXIN GENE, PCR: NEGATIVE
PRESUMPTIVE 027/NAP1/BI: NEGATIVE

## 2021-09-01 MED ORDER — FUROSEMIDE 10 MG/ML INJECTION SOLUTION
40.0000 mg | Freq: Once | INTRAMUSCULAR | Status: AC
Start: 2021-09-01 — End: 2021-09-01
  Administered 2021-09-01: 40 mg via INTRAVENOUS
  Filled 2021-09-01: qty 4

## 2021-09-01 MED ORDER — CHOLESTYRAMINE (WITH SUGAR) 4 GRAM POWDER FOR SUSP IN A PACKET
1.0000 | Freq: Every day | ORAL | Status: DC
Start: 2021-09-01 — End: 2021-09-06
  Administered 2021-09-01 – 2021-09-02 (×2): 1 via ORAL
  Administered 2021-09-03: 0 via ORAL
  Administered 2021-09-04 – 2021-09-06 (×3): 1 via ORAL
  Filled 2021-09-01 (×6): qty 1

## 2021-09-01 MED ORDER — RAMELTEON 8 MG TABLET
8.0000 mg | ORAL_TABLET | Freq: Every evening | ORAL | Status: DC | PRN
Start: 2021-09-01 — End: 2021-09-06
  Administered 2021-09-01 – 2021-09-05 (×6): 8 mg via ORAL
  Filled 2021-09-01 (×6): qty 1

## 2021-09-01 MED ORDER — MAGNESIUM SULFATE 1 GRAM/100 ML IN DEXTROSE 5 % INTRAVENOUS PIGGYBACK
1.0000 g | INJECTION | INTRAVENOUS | Status: AC
Start: 2021-09-01 — End: 2021-09-01
  Administered 2021-09-01: 0 g via INTRAVENOUS
  Administered 2021-09-01 (×2): 1 g via INTRAVENOUS
  Administered 2021-09-01: 0 g via INTRAVENOUS
  Filled 2021-09-01 (×2): qty 100

## 2021-09-01 NOTE — Nurses Notes (Signed)
Note pt A&O4 - sitting up to bedside. Pt has c/o abd pain at this time- has abd distention and slight swelling to Bilateral lower extremities.  BM this AM, however only mucus in toilet. Pt tolerating clear liq diet at this time. AM assessment completed. AM vitals obtained and WNL. Cheri Rous, RN  09/01/2021, 08:38

## 2021-09-01 NOTE — Progress Notes (Signed)
[facility]               IP PROGRESS NOTE      [name]  Date of Admission:  08/30/2021  Date of Birth:  [birth year]  Date of Service:  09/01/2021    Hospital Day:  LOS: 2 days       Subjective:   Patient seen and examined this am. Sitting up on side of bed with family at bedside. Reports only one bowel movement so far this am. Reports pain and breathing are about the same. No chest pain, shortness of breath, nausea or vomiting. Is tolerating clear liquids. Is okay with advancing to full liquids. Also reports abdomen has been distended and firm for over a year now every since he had his hernia repair.   Vital Signs:  Temp (24hrs) Max:37.1 C (98.8 F)      Temperature: 36.7 C (98.1 F)  BP (Non-Invasive): (!) 143/90  MAP (Non-Invasive): 107 mmHG  Heart Rate: 98  Respiratory Rate: 20  SpO2: 97 %    Current Medications:  acetaminophen (TYLENOL) tablet, 650 mg, Oral, Q4H PRN  albuterol (PROVENTIL) 2.5 mg / 3 mL (0.083%) neb solution, 2.5 mg, Nebulization, Q6H  albuterol (PROVENTIL) 2.5 mg / 3 mL (0.083%) neb solution, 2.5 mg, Nebulization, Q4H PRN  aluminum-magnesium hydroxide-simethicone (MAG-AL PLUS) 200-200-20 mg per 5 mL oral liquid, 10 mL, Oral, Q4H PRN  cholestyramine-sucrose (QUESTRAN) 4 gram packet, 1 Packet, Oral, Daily  folic acid (FOLVITE) tablet, 1 mg, Oral, Daily  guaiFENesin '100mg'$  per 57m oral liquid - for cough (expectorant), 200 mg, Oral, Q4H PRN  levoFLOXacin (LEVAQUIN) 500 mg in D5W 100 mL premix IVPB, 500 mg, Intravenous, Q24H  LORazepam (ATIVAN) 2 mg/mL injection, 0.5 mg, Intravenous, Q4H PRN  magnesium sulfate 1 G in D5W 100 mL premix IVPB, 1 g, Intravenous, Q1H  methocarbamol (ROBAXIN) tablet, 750 mg, Oral, 3x/day  methylPREDNISolone sod succ (SOLU-MEDROL) 40 mg/mL injection, 20 mg, Intravenous, Q8H  metroNIDAZOLE (FLAGYL) 500 mg in NS 100 mL premix IVPB, 500 mg, Intravenous, Q12H  morphine 4 mg/mL injection, 4 mg, Intravenous, Q4H PRN  nicotine (NICODERM CQ)  transdermal patch (mg/24 hr), 21 mg, Transdermal, Daily  ondansetron (ZOFRAN) 2 mg/mL injection, 4 mg, Intravenous, Q6H PRN  oxyCODONE-acetaminophen (PERCOCET) 5-'325mg'$  per tablet, 1 Tablet, Oral, Q4H PRN  pantoprazole (PROTONIX) injection, 40 mg, Intravenous, Daily  prochlorperazine (COMPAZINE) 5 mg/mL injection, 10 mg, Intravenous, Q6H PRN  ramelteon (ROZEREM) tablet, 8 mg, Oral, HS PRN        Current Orders:  Active Orders   Lab    CBC/DIFF     Frequency: ONE TIME     Number of Occurrences: 1 Occurrences    COMPREHENSIVE METABOLIC PANEL, NON-FASTING     Frequency: ONE TIME     Number of Occurrences: 1 Occurrences    MAGNESIUM     Frequency: ONE TIME     Number of Occurrences: 1 Occurrences   Diet    DIET FULL LIQUID Do you want to initiate MNT Protocol? Yes     Frequency: All Meals     Number of Occurrences: 1 Occurrences   Nursing    ACTIVITY Activity: OOB WITH ASSISTANCE     Frequency: UNTIL DISCONTINUED     Number of Occurrences: Until Specified    ENCOURAGE AMBULATION AND ROM EXERCISES     Frequency: UNTIL DISCONTINUED     Number of Occurrences: Until Specified    Notify MD Vital Signs     Frequency:  PRN     Number of Occurrences: Until Specified    PT IS LOW RISK FOR VENOUS THROMBOEMBOLISM     Frequency: CONTINUOUS     Number of Occurrences: Until Specified    PULSE OXIMETRY Q4H     Frequency: Q4H     Number of Occurrences: Until Specified    TELEMETRY MONITORING - Continuous     Frequency: CONTINUOUS     Number of Occurrences: Until Specified    VITAL SIGNS QSHIFT     Frequency: QSHIFT     Number of Occurrences: Until Specified   Code Status    FULL CODE     Frequency: CONTINUOUS     Number of Occurrences: Until Specified   Consult    IP CONSULT TO GASTROENTEROLOGY Requested Provider; Netty Starring     Frequency: ONE TIME     Number of Occurrences: 1 Occurrences     Order Comments: Nurse may enter secondary order if modifications/details needed.       IP CONSULT TO GENERAL SURGERY Requested Provider;  HOPKINS, ERIC S; 08/31/2021; 12:04 PM     Frequency: ONE TIME     Number of Occurrences: 1 Occurrences     Order Comments: Nurse may enter secondary order if modifications/details needed.      Respiratory Care    OXYGEN - NASAL CANNULA     Frequency: CONTINUOUS     Number of Occurrences: Until Specified     Order Comments: Flowrate should not exeed 6L/min except [facility] facility.          Medications    acetaminophen (TYLENOL) tablet     Frequency: Q4H PRN     Dose: 650 mg     Route: Oral    albuterol (PROVENTIL) 2.5 mg / 3 mL (0.083%) neb solution     Frequency: Q6H     Dose: 2.5 mg     Route: Nebulization    albuterol (PROVENTIL) 2.5 mg / 3 mL (0.083%) neb solution     Frequency: Q4H PRN     Dose: 2.5 mg     Route: Nebulization    aluminum-magnesium hydroxide-simethicone (MAG-AL PLUS) 200-200-20 mg per 5 mL oral liquid     Frequency: Q4H PRN     Dose: 10 mL     Route: Oral    cholestyramine-sucrose (QUESTRAN) 4 gram packet     Frequency: Daily     Dose: 1 Packet     Route: Oral    folic acid (FOLVITE) tablet     Frequency: Daily     Dose: 1 mg     Route: Oral    guaiFENesin '100mg'$  per 36m oral liquid - for cough (expectorant)     Frequency: Q4H PRN     Dose: 200 mg     Route: Oral    levoFLOXacin (LEVAQUIN) 500 mg in D5W 100 mL premix IVPB     Frequency: Q24H     Dose: 500 mg     Route: Intravenous    LORazepam (ATIVAN) 2 mg/mL injection     Frequency: Q4H PRN     Dose: 0.5 mg     Route: Intravenous    magnesium sulfate 1 G in D5W 100 mL premix IVPB     Frequency: Q1H     Dose: 1 g     Route: Intravenous    methocarbamol (ROBAXIN) tablet     Frequency: 3x/day     Dose: 750 mg     Route: Oral  methylPREDNISolone sod succ (SOLU-MEDROL) 40 mg/mL injection     Frequency: Q8H     Dose: 20 mg     Route: Intravenous    metroNIDAZOLE (FLAGYL) 500 mg in NS 100 mL premix IVPB     Frequency: Q12H     Dose: 500 mg     Route: Intravenous    morphine 4 mg/mL injection     Frequency: Q4H PRN     Dose: 4 mg     Route: Intravenous     nicotine (NICODERM CQ) transdermal patch (mg/24 hr)     Frequency: Daily     Dose: 21 mg     Route: Transdermal    ondansetron (ZOFRAN) 2 mg/mL injection     Frequency: Q6H PRN     Dose: 4 mg     Route: Intravenous    oxyCODONE-acetaminophen (PERCOCET) 5-'325mg'$  per tablet     Frequency: Q4H PRN     Dose: 1 Tablet     Route: Oral    pantoprazole (PROTONIX) injection     Frequency: Daily     Dose: 40 mg     Route: Intravenous    prochlorperazine (COMPAZINE) 5 mg/mL injection     Frequency: Q6H PRN     Dose: 10 mg     Route: Intravenous    ramelteon (ROZEREM) tablet     Frequency: HS PRN     Dose: 8 mg     Route: Oral        Review of Systems:  Focused review of system was completed. Refer to the HPI for ROS details.     Today's Physical Exam:  Physical Exam  Constitutional:       General: He is not in acute distress.  Cardiovascular:      Rate and Rhythm: Normal rate and regular rhythm.      Pulses: Normal pulses.   Pulmonary:      Effort: Pulmonary effort is normal. No respiratory distress.      Breath sounds: Normal breath sounds. No wheezing.   Abdominal:      Tenderness: There is no abdominal tenderness.      Comments: Distended, firm   Musculoskeletal:         General: Swelling present.      Comments: Bilateral edema lower extremities   Neurological:      General: No focal deficit present.      Mental Status: He is alert and oriented to person, place, and time.   Psychiatric:         Mood and Affect: Mood normal.          I/O:  I/O last 24 hours:      Intake/Output Summary (Last 24 hours) at 09/01/2021 1130  Last data filed at 09/01/2021 1017  Gross per 24 hour   Intake 678.75 ml   Output --   Net 678.75 ml     I/O current shift:  03/15 0700 - 03/15 1859  In: 360 [P.O.:360]  Out: -       Labs:  Reviewed: I have reviewed all lab results.  Lab Results Today:    Results for orders placed or performed during the hospital encounter of 08/30/21 (from the past 24 hour(s))   C. DIFFICILE PCR TESTING    Specimen: Stool    Result Value Ref Range    C. DIFFICILE TOXIN GENE, PCR Negative Negative    PRESUMPTIVE 027/NAP1/BI Negative Not Detected, Negative   COMPREHENSIVE METABOLIC PANEL, NON-FASTING  Result Value Ref Range    SODIUM 136 136 - 145 mmol/L    POTASSIUM 3.9 3.5 - 5.1 mmol/L    CHLORIDE 100 98 - 107 mmol/L    CO2 TOTAL 28 21 - 31 mmol/L    ANION GAP 8 (L) 10 - 20 mmol/L    BUN 2 (L) 7 - 25 mg/dL    CREATININE 0.56 (L) 0.60 - 1.30 mg/dL    BUN/CREA RATIO 4 (L) 6 - 22    ESTIMATED GFR 125 >59 mL/min/1.12m2    ALBUMIN 3.4 (L) 3.5 - 5.7 g/dL    CALCIUM 8.4 (L) 8.6 - 10.3 mg/dL    GLUCOSE 171 (H) 74 - 109 mg/dL    ALKALINE PHOSPHATASE 97 34 - 104 U/L    ALT (SGPT) 29 7 - 52 U/L    AST (SGOT) 54 (H) 13 - 39 U/L    BILIRUBIN TOTAL 0.9 0.3 - 1.2 mg/dL    PROTEIN TOTAL 6.6 6.4 - 8.9 g/dL    ALBUMIN/GLOBULIN RATIO 1.1 0.8 - 1.4    OSMOLALITY, CALCULATED 272 270 - 290 mOsm/kg    CALCIUM, CORRECTED 9.0 8.9 - 10.8 mg/dL    GLOBULIN 3.2 2.9 - 5.4   MAGNESIUM   Result Value Ref Range    MAGNESIUM 1.7 (L) 1.9 - 2.7 mg/dL   CBC WITH DIFF   Result Value Ref Range    WBCS UNCORRECTED 14.2 x10^3/uL    WBC 14.2 (H) 4.0 - 10.5 x10^3/uL    RBC 3.61 (L) 4.20 - 6.00 x10^6/uL    HGB 12.2 (L) 13.5 - 18.0 g/dL    HCT 35.8 (L) 42.0 - 51.0 %    MCV 99.2 (H) 78.0 - 99.0 fL    MCH 33.8 (H) 27.0 - 32.0 pg    MCHC 34.0 32.0 - 36.0 g/dL    RDW 17.3 (H) 11.6 - 14.8 %    PLATELETS 472 (H) 140 - 440 x10^3/uL    MPV 6.9 (L) 7.4 - 10.4 fL    NEUTROPHIL % 83 (H) 40 - 76 %    LYMPHOCYTE % 10 (L) 25 - 45 %    MONOCYTE % 6 0 - 12 %    EOSINOPHIL % 0 0 - 7 %    BASOPHIL % 0 0 - 3 %    NEUTROPHIL # 11.80 4.10 - 29.00 x10^3/uL    LYMPHOCYTE # 1.50 (L) 2.10 - 11.00 x10^3/uL    MONOCYTE # 0.90 0.00 - 4.10 x10^3/uL    EOSINOPHIL # 0.00 0.00 - 2.40 x10^3/uL    BASOPHIL # 0.00 0.00 - 2.50 x10^3/uL       Images:   XR CHEST PA AND LATERAL    Result Date: 08/30/2021  Impression 1. Mildly hyperexpanded lungs. 2. Borderline cardiomegaly. 3. No acute cardiopulmonary process,  infiltrates, consolidation or effusion. 4. No significant change since prior study. Radiologist location ID: [id number]    CT ABDOMEN PELVIS W IV CONTRAST    Result Date: 08/30/2021  Impression Diffuse hepatic steatosis with lower density areas seen throughout the periphery of the liver possibly indicating heterogenous fatty deposition. This could be further evaluated with a follow-up nonemergent MRI of the liver with and without contrast. There is wall thickening involving the sigmoid colon which could represent colitis in the correct clinical setting. Please correlate with patient's clinical signs and symptoms. Follow-up colonoscopy is recommended if one has not been recently performed. One or more dose reduction techniques were used (e.g., Automated exposure control, adjustment  of the mA and/or kV according to patient size, use of iterative reconstruction technique). 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 Ulcerative Colitis with rectal bleeding:    Continue Levaquin, flagyl, and solu-medrol.   H/h stable.    Dr. Keith Rake consulted for GI. Recommendations appreciated.    Dr. Georgiann Cocker consulted for surgery eval. Recommendations appreciated.    Full liquid diet.    CDT negative. Stool culture pending.   2. Electrolyte Imbalance:   Potassium 3.9 today.    Magnesium replacement ordered.    Repeat labs in am.   3. Lactic acidosis:   Improving.   4. COPD:   Continue nebs. Chronic.   5. Other:   Further treatment adjustments will be made as indicated. See orders.    Hospitalist personally evaluated and examined the patient in conjunction with the MLP and agree with the   assessments, treatment plan and disposition of the patient as recorded by the [facility].     DVT/PE Prophylaxis: Not indicated, low risk for VTE    Joselyn Glassman, FNP-BC      I evaluated the patient independent of the APP and plan was discussed and agreed upon.     Celene Kras, DO

## 2021-09-01 NOTE — Care Plan (Signed)
Patient admitted for Ulcerative Colitis. Continuous pulse oximetry and Telemetry monitoring in use. Vitals and Labs monitored per providers orders. Pt is tolerating a Full Liquid Diet. NS infusing at 125 ml/hr. Hx of ETOH Abuse. CDT negative. Questran powder ordered. GI and surgery consulted. Pt educated on plan of care. Discharge planning initiated on admission and dependent on clinical course.

## 2021-09-01 NOTE — Consults (Signed)
[facility]  Gastroenterology/ Hepatology Progress Note      [name]  Date of service: 09/01/2021    Subjective     Pt is tolerating clear liquids and wants to advance diet. He did have a mucous stool this morning. He does have tenderness LLQ.      acetaminophen (TYLENOL) tablet, 650 mg, Oral, Q4H PRN  albuterol (PROVENTIL) 2.5 mg / 3 mL (0.083%) neb solution, 2.5 mg, Nebulization, Q6H  albuterol (PROVENTIL) 2.5 mg / 3 mL (0.083%) neb solution, 2.5 mg, Nebulization, Q4H PRN  aluminum-magnesium hydroxide-simethicone (MAG-AL PLUS) 200-200-20 mg per 5 mL oral liquid, 10 mL, Oral, Q4H PRN  cholestyramine-sucrose (QUESTRAN) 4 gram packet, 1 Packet, Oral, Daily  folic acid (FOLVITE) tablet, 1 mg, Oral, Daily  guaiFENesin 184m per 541moral liquid - for cough (expectorant), 200 mg, Oral, Q4H PRN  levoFLOXacin (LEVAQUIN) 500 mg in D5W 100 mL premix IVPB, 500 mg, Intravenous, Q24H  LORazepam (ATIVAN) 2 mg/mL injection, 0.5 mg, Intravenous, Q4H PRN  methocarbamol (ROBAXIN) tablet, 750 mg, Oral, 3x/day  methylPREDNISolone sod succ (SOLU-MEDROL) 40 mg/mL injection, 20 mg, Intravenous, Q8H  metroNIDAZOLE (FLAGYL) 500 mg in NS 100 mL premix IVPB, 500 mg, Intravenous, Q12H  morphine 4 mg/mL injection, 4 mg, Intravenous, Q4H PRN  nicotine (NICODERM CQ) transdermal patch (mg/24 hr), 21 mg, Transdermal, Daily  ondansetron (ZOFRAN) 2 mg/mL injection, 4 mg, Intravenous, Q6H PRN  oxyCODONE-acetaminophen (PERCOCET) 5-32523mer tablet, 1 Tablet, Oral, Q4H PRN  pantoprazole (PROTONIX) injection, 40 mg, Intravenous, Daily  prochlorperazine (COMPAZINE) 5 mg/mL injection, 10 mg, Intravenous, Q6H PRN  ramelteon (ROZEREM) tablet, 8 mg, Oral, HS PRN        Allergies   Allergen Reactions   . Noctec [Chloral Hydrate] Anaphylaxis   . Penicillins Anaphylaxis   . Theophylline Shortness of Breath   . Flexeril [Cyclobenzaprine] Mental Status Effect     Homicidal ideation   . Prednisone Myalgia       Objective     Vital  Signs:  Temp (24hrs) Max:37.1 C (98.51.7      Systolic (24h00FVCAvgBSW:967Min:96 , MaxRFF:638  Diastolic (24h46KZLAvgDJT:70in:62, Max:107    Temp  Avg: 36.7 C (98.1 F)  Min: 36.3 C (97.4 F)  Max: 37.1 C (98.8 F)  MAP (Non-Invasive)  Avg: 98 mmHG  Min: 75 mmHG  Max: 119 mmHG  Pulse  Avg: 91.9  Min: 77  Max: 102  Resp  Avg: 17.2  Min: 13  Max: 21  SpO2  Avg: 95.8 %  Min: 90 %  Max: 100 %       I/O:  I/O last 24 hours:      Intake/Output Summary (Last 24 hours) at 09/01/2021 0919  Last data filed at 08/31/2021 2255  Gross per 24 hour   Intake 418.75 ml   Output --   Net 418.75 ml     I/O current shift:  No intake/output data recorded.    Physical Exam:  Respiratory:  Clear to auscultation bilaterally.   Gastrointestinal:  Bowel sounds normal, LLQ tenderness    Labs:  No results found for: ALPHFETOPROT, CA199X   CBC  Diff   Lab Results   Component Value Date/Time    WBC 14.2 (H) 09/01/2021 04:52 AM    HGB 12.2 (L) 09/01/2021 04:52 AM    HCT 35.8 (L) 09/01/2021 04:52 AM    PLTCNT 472 (H) 09/01/2021 04:52 AM    RBC 3.61 (L) 09/01/2021 04:52 AM  MCV 99.2 (H) 09/01/2021 04:52 AM    MCHC 34.0 09/01/2021 04:52 AM    MCH 33.8 (H) 09/01/2021 04:52 AM    RDW 17.3 (H) 09/01/2021 04:52 AM    MPV 6.9 (L) 09/01/2021 04:52 AM    Lab Results   Component Value Date/Time    PMNS 83 (H) 09/01/2021 04:52 AM    LYMPHOCYTES 10 (L) 09/01/2021 04:52 AM    EOSINOPHIL 0 09/01/2021 04:52 AM    MONOCYTES 6 09/01/2021 04:52 AM    BASOPHILS 0 09/01/2021 04:52 AM    BASOPHILS 0.00 09/01/2021 04:52 AM    PMNABS 11.80 09/01/2021 04:52 AM    LYMPHSABS 1.50 (L) 09/01/2021 04:52 AM    EOSABS 0.00 09/01/2021 04:52 AM    MONOSABS 0.90 09/01/2021 04:52 AM           Last BMP  (Last result in the past 24 hours)      Na   K   Cl   CO2   BUN   Cr   Calcium   Glucose   Glucose-Fasting        09/01/21 0452 136   3.9   100   28   2   0.56   8.4   171           Last Hepatic Panel  (Last result in the past 24 hours)      Albumin   Total PTN   Total Bili    Direct Bili   Ast/SGOT   Alt/SGPT   Alk Phos        09/01/21 0452 3.4   6.6   0.9     54   29   97              Radiology:    No results found for this or any previous visit (from the past 540981191 hour(s)).  No results found for this or any previous visit (from the past 478295621 hour(s)).  Recent Results (from the past [id number] hour(s))   CT ABDOMEN PELVIS W IV CONTRAST    Collection Time: 08/30/21  2:15 PM    Narrative    SR. Myrle ANDREW Quinonez    RADIOLOGIST: Tempie Donning    CT ABDOMEN PELVIS W IV CONTRAST performed on 08/30/2021 2:15 PM    CLINICAL HISTORY: nausea, chronic diarrhea.  vomiting/diarrhea     TECHNIQUE:  Abdomen and pelvis CT with intravenous contrast.  IV CONTRAST: 100 ml's of Omnipaque 350    COMPARISON:  11/26/2016  # of known CTs in the past 12 months: 0   # of known Cardiac Nuclear Medicine Studies in the past 12 months: 0    FINDINGS:  Lung bases: Clear    Liver:   The liver is enlarged. There is diffuse hepatic steatosis with lower density areas seen throughout the periphery.    Gallbladder:   Unremarkable.    Spleen:   Unremarkable.    Pancreas:   Unremarkable.    Adrenals:   Unremarkable.    Kidneys:   Stable cyst in the right kidney. No hydronephrosis.    Bladder:  Unremarkable.  Prostate:  Unremarkable.    Bowel:   There is wall thickening involving the sigmoid colon. There is some submucosal fatty infiltration throughout portions of the descending colon. There is no large or small bowel dilatation.    Appendix:  Normal.    Lymph nodes:  No suspicious lymph node enlargement.    Vasculature:   Mild diffuse atherosclerotic  calcifications are noted.     Peritoneum / Retroperitoneum: No ascites.  No free air.    Bones:   Unremarkable.          Impression    Diffuse hepatic steatosis with lower density areas seen throughout the periphery of the liver possibly indicating heterogenous fatty deposition. This could be further evaluated with a follow-up nonemergent MRI of the liver with and  without contrast.    There is wall thickening involving the sigmoid colon which could represent colitis in the correct clinical setting. Please correlate with patient's clinical signs and symptoms. Follow-up colonoscopy is recommended if one has not been recently performed.      One or more dose reduction techniques were used (e.g., Automated exposure control, adjustment of the mA and/or kV according to patient size, use of iterative reconstruction technique).      Radiologist location ID: [id number]       No results found for this or any previous visit (from the past 258527782 hour(s)).  No results found for this or any previous visit (from the past 423536144 hour(s)).  No results found for this or any previous visit (from the past 315400867 hour(s)).    Microbiology:    Hospital Encounter on 08/30/21 (from the past 24 hour(s))   C. DIFFICILE PCR TESTING    Collection Time: 08/31/21 10:14 PM    Specimen: Stool   Culture Result Status    C. DIFFICILE TOXIN GENE, PCR Negative Final    PRESUMPTIVE 027/NAP1/BI Negative Final        Assessment/ Plan:   Active Hospital Problems    Diagnosis   . 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 Continuous tobacco abuse   . HTN (hypertension)   . Lactic acidosis   . COPD (chronic obstructive pulmonary disease) (CMS HCC)   . Hypokalemia   . Leucocytosis       Continue current management. CDT negative. Stool culture pending. Will start Cholestyramine 4 gm po daily. Advance diet as tolerated. Again discussed flex sigmoidoscopy in the near future if he agrees. Continue antibiotics. Further recommendations dependent upon clinical course. Discussed with Dr.K.Patel and treatment y him.    Park Pope, NP-C

## 2021-09-02 ENCOUNTER — Inpatient Hospital Stay (HOSPITAL_COMMUNITY): Payer: Medicaid Other

## 2021-09-02 ENCOUNTER — Other Ambulatory Visit: Payer: Self-pay

## 2021-09-02 DIAGNOSIS — R600 Localized edema: Secondary | ICD-10-CM

## 2021-09-02 LAB — COMPREHENSIVE METABOLIC PANEL, NON-FASTING
ALBUMIN/GLOBULIN RATIO: 1.1 (ref 0.8–1.4)
ALBUMIN: 3.5 g/dL (ref 3.5–5.7)
ALKALINE PHOSPHATASE: 96 U/L (ref 34–104)
ALT (SGPT): 25 U/L (ref 7–52)
ANION GAP: 9 mmol/L — ABNORMAL LOW (ref 10–20)
AST (SGOT): 58 U/L — ABNORMAL HIGH (ref 13–39)
BILIRUBIN TOTAL: 0.7 mg/dL (ref 0.3–1.2)
BUN/CREA RATIO: 8 (ref 6–22)
BUN: 4 mg/dL — ABNORMAL LOW (ref 7–25)
CALCIUM, CORRECTED: 9.2 mg/dL (ref 8.9–10.8)
CALCIUM: 8.7 mg/dL (ref 8.6–10.3)
CHLORIDE: 99 mmol/L (ref 98–107)
CO2 TOTAL: 28 mmol/L (ref 21–31)
CREATININE: 0.5 mg/dL — ABNORMAL LOW (ref 0.60–1.30)
ESTIMATED GFR: 130 mL/min/{1.73_m2} (ref >59)
GLOBULIN: 3.2 (ref 2.9–5.4)
GLUCOSE: 156 mg/dL — ABNORMAL HIGH (ref 74–109)
OSMOLALITY, CALCULATED: 272 mOsm/kg (ref 270–290)
POTASSIUM: 3.5 mmol/L (ref 3.5–5.1)
PROTEIN TOTAL: 6.7 g/dL (ref 6.4–8.9)
SODIUM: 136 mmol/L (ref 136–145)

## 2021-09-02 LAB — CBC WITH DIFF
BASOPHIL #: 0 10*3/uL (ref 0.00–2.50)
BASOPHIL %: 0 % (ref 0–3)
EOSINOPHIL #: 0 10*3/uL (ref 0.00–2.40)
EOSINOPHIL %: 0 % (ref 0–7)
HCT: 36.1 % — ABNORMAL LOW (ref 42.0–51.0)
HGB: 12.4 g/dL — ABNORMAL LOW (ref 13.5–18.0)
LYMPHOCYTE #: 1.6 10*3/uL — ABNORMAL LOW (ref 2.10–11.00)
LYMPHOCYTE %: 10 % — ABNORMAL LOW (ref 25–45)
MCH: 34.3 pg — ABNORMAL HIGH (ref 27.0–32.0)
MCHC: 34.3 g/dL (ref 32.0–36.0)
MCV: 99.9 fL — ABNORMAL HIGH (ref 78.0–99.0)
MONOCYTE #: 1 10*3/uL (ref 0.00–4.10)
MONOCYTE %: 6 % (ref 0–12)
MPV: 7 fL — ABNORMAL LOW (ref 7.4–10.4)
NEUTROPHIL #: 14.6 10*3/uL (ref 4.10–29.00)
NEUTROPHIL %: 85 % — ABNORMAL HIGH (ref 40–76)
PLATELETS: 508 10*3/uL — ABNORMAL HIGH (ref 140–440)
RBC: 3.62 10*6/uL — ABNORMAL LOW (ref 4.20–6.00)
RDW: 17.5 % — ABNORMAL HIGH (ref 11.6–14.8)
WBC: 17.2 10*3/uL — ABNORMAL HIGH (ref 4.0–10.5)
WBCS UNCORRECTED: 17.2 10*3/uL

## 2021-09-02 LAB — MAGNESIUM: MAGNESIUM: 2.1 mg/dL (ref 1.9–2.7)

## 2021-09-02 MED ORDER — MAGNESIUM SULFATE 1 GRAM/100 ML IN DEXTROSE 5 % INTRAVENOUS PIGGYBACK
1.0000 g | INJECTION | INTRAVENOUS | Status: AC
Start: 2021-09-02 — End: 2021-09-02
  Administered 2021-09-02: 1 g via INTRAVENOUS
  Administered 2021-09-02 (×2): 0 g via INTRAVENOUS
  Administered 2021-09-02: 1 g via INTRAVENOUS
  Administered 2021-09-02: 0 g via INTRAVENOUS
  Administered 2021-09-02: 1 g via INTRAVENOUS
  Filled 2021-09-02 (×3): qty 100

## 2021-09-02 NOTE — Consults (Signed)
[facility]  Gastroenterology/ Hepatology Progress Note      [name]  Date of service: 09/02/2021    Subjective      Pt states that when he eats even with full liquid diet, he feels full and queasy.  Having epigastric pain. No vomiting. BM have improved with starting Cholestyramine. Denies any alcohol withdrawal symptoms as he does drink 3-4 beers daily in the evenings.      acetaminophen (TYLENOL) tablet, 650 mg, Oral, Q4H PRN  albuterol (PROVENTIL) 2.5 mg / 3 mL (0.083%) neb solution, 2.5 mg, Nebulization, Q6H  albuterol (PROVENTIL) 2.5 mg / 3 mL (0.083%) neb solution, 2.5 mg, Nebulization, Q4H PRN  aluminum-magnesium hydroxide-simethicone (MAG-AL PLUS) 200-200-20 mg per 5 mL oral liquid, 10 mL, Oral, Q4H PRN  cholestyramine-sucrose (QUESTRAN) 4 gram packet, 1 Packet, Oral, Daily  folic acid (FOLVITE) tablet, 1 mg, Oral, Daily  guaiFENesin 130m per 523moral liquid - for cough (expectorant), 200 mg, Oral, Q4H PRN  levoFLOXacin (LEVAQUIN) 500 mg in D5W 100 mL premix IVPB, 500 mg, Intravenous, Q24H  LORazepam (ATIVAN) 2 mg/mL injection, 0.5 mg, Intravenous, Q4H PRN  magnesium sulfate 1 G in D5W 100 mL premix IVPB, 1 g, Intravenous, Q1H  methocarbamol (ROBAXIN) tablet, 750 mg, Oral, 3x/day  methylPREDNISolone sod succ (SOLU-MEDROL) 40 mg/mL injection, 20 mg, Intravenous, Q8H  metroNIDAZOLE (FLAGYL) 500 mg in NS 100 mL premix IVPB, 500 mg, Intravenous, Q12H  morphine 4 mg/mL injection, 4 mg, Intravenous, Q4H PRN  nicotine (NICODERM CQ) transdermal patch (mg/24 hr), 21 mg, Transdermal, Daily  ondansetron (ZOFRAN) 2 mg/mL injection, 4 mg, Intravenous, Q6H PRN  oxyCODONE-acetaminophen (PERCOCET) 5-32531mer tablet, 1 Tablet, Oral, Q4H PRN  pantoprazole (PROTONIX) injection, 40 mg, Intravenous, Daily  prochlorperazine (COMPAZINE) 5 mg/mL injection, 10 mg, Intravenous, Q6H PRN  ramelteon (ROZEREM) tablet, 8 mg, Oral, HS PRN        Allergies   Allergen Reactions   . Noctec [Chloral Hydrate]  Anaphylaxis   . Penicillins Anaphylaxis   . Theophylline Shortness of Breath   . Flexeril [Cyclobenzaprine] Mental Status Effect     Homicidal ideation   . Prednisone Myalgia       Objective     Vital Signs:  Temp (24hrs) Max:36.9 C (98.94.4      Systolic ([id number]:846Min:123 , MaxKZL:935  Diastolic (24h70VXBAvgLTJ:03in:72, Max:90    Temp  Avg: 36.6 C (97.9 F)  Min: 36.3 C (97.3 F)  Max: 36.9 C (98.4 F)  Pulse  Avg: 97.7  Min: 89  Max: 104  Resp  Avg: 20  Min: 20  Max: 20  SpO2  Avg: 94.6 %  Min: 91 %  Max: 97 %       I/O:  I/O last 24 hours:      Intake/Output Summary (Last 24 hours) at 09/02/2021 0839  Last data filed at 09/02/2021 0600  Gross per 24 hour   Intake 1946.67 ml   Output 2 ml   Net 1944.67 ml       Physical Exam:  Constitutional:  appears in good health and no distress  Respiratory:  Clear to auscultation bilaterally.   Gastrointestinal:  Bowel sounds normal, epigastric pain    Labs:  No results found for: ALPHFETOPROT, CA199X   CBC  Diff   Lab Results   Component Value Date/Time    WBC 17.2 (H) 09/02/2021 06:34 AM    HGB 12.4 (L) 09/02/2021 06:34 AM    HCT  36.1 (L) 09/02/2021 06:34 AM    PLTCNT 508 (H) 09/02/2021 06:34 AM    RBC 3.62 (L) 09/02/2021 06:34 AM    MCV 99.9 (H) 09/02/2021 06:34 AM    MCHC 34.3 09/02/2021 06:34 AM    MCH 34.3 (H) 09/02/2021 06:34 AM    RDW 17.5 (H) 09/02/2021 06:34 AM    MPV 7.0 (L) 09/02/2021 06:34 AM    Lab Results   Component Value Date/Time    PMNS 85 (H) 09/02/2021 06:34 AM    LYMPHOCYTES 10 (L) 09/02/2021 06:34 AM    EOSINOPHIL 0 09/02/2021 06:34 AM    MONOCYTES 6 09/02/2021 06:34 AM    BASOPHILS 0 09/02/2021 06:34 AM    BASOPHILS 0.00 09/02/2021 06:34 AM    PMNABS 14.60 09/02/2021 06:34 AM    LYMPHSABS 1.60 (L) 09/02/2021 06:34 AM    EOSABS 0.00 09/02/2021 06:34 AM    MONOSABS 1.00 09/02/2021 06:34 AM           Last BMP  (Last result in the past 24 hours)      Na   K   Cl   CO2   BUN   Cr   Calcium   Glucose   Glucose-Fasting        09/02/21 0634 136    3.5   99   28   4   0.50   8.7   156           Last Hepatic Panel  (Last result in the past 24 hours)      Albumin   Total PTN   Total Bili   Direct Bili   Ast/SGOT   Alt/SGPT   Alk Phos        09/02/21 0634 3.5   6.7   0.7     58   25   96              Radiology:      Recent Results (from the past [id number] hour(s))   CT ABDOMEN PELVIS W IV CONTRAST    Collection Time: 08/30/21  2:15 PM    Narrative    SR. Ishaan ANDREW Abdella    RADIOLOGIST: Tempie Donning    CT ABDOMEN PELVIS W IV CONTRAST performed on 08/30/2021 2:15 PM    CLINICAL HISTORY: nausea, chronic diarrhea.  vomiting/diarrhea     TECHNIQUE:  Abdomen and pelvis CT with intravenous contrast.  IV CONTRAST: 100 ml's of Omnipaque 350    COMPARISON:  11/26/2016  # of known CTs in the past 12 months: 0   # of known Cardiac Nuclear Medicine Studies in the past 12 months: 0    FINDINGS:  Lung bases: Clear    Liver:   The liver is enlarged. There is diffuse hepatic steatosis with lower density areas seen throughout the periphery.    Gallbladder:   Unremarkable.    Spleen:   Unremarkable.    Pancreas:   Unremarkable.    Adrenals:   Unremarkable.    Kidneys:   Stable cyst in the right kidney. No hydronephrosis.    Bladder:  Unremarkable.  Prostate:  Unremarkable.    Bowel:   There is wall thickening involving the sigmoid colon. There is some submucosal fatty infiltration throughout portions of the descending colon. There is no large or small bowel dilatation.    Appendix:  Normal.    Lymph nodes:  No suspicious lymph node enlargement.    Vasculature:   Mild diffuse atherosclerotic calcifications are  noted.     Peritoneum / Retroperitoneum: No ascites.  No free air.    Bones:   Unremarkable.          Impression    Diffuse hepatic steatosis with lower density areas seen throughout the periphery of the liver possibly indicating heterogenous fatty deposition. This could be further evaluated with a follow-up nonemergent MRI of the liver with and without  contrast.    There is wall thickening involving the sigmoid colon which could represent colitis in the correct clinical setting. Please correlate with patient's clinical signs and symptoms. Follow-up colonoscopy is recommended if one has not been recently performed.      One or more dose reduction techniques were used (e.g., Automated exposure control, adjustment of the mA and/or kV according to patient size, use of iterative reconstruction technique).      Radiologist location ID: [id number]           Assessment/ Plan:   Active Hospital Problems    Diagnosis   . 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 Tobacco use disorder   . Continuous tobacco abuse   . HTN (hypertension)   . Lactic acidosis   . COPD (chronic obstructive pulmonary disease) (CMS HCC)   . Hypokalemia   . Leucocytosis     Continue current management. EGD tomorrow. Risks of bleeding,infection, perforation, slowing of respiration and allergic reaction discussed and he agrees to proceed. Continue Cholestyramine daily. Continue Protonix. Further recommendations dependent upon clinical course. Discussed with Dr.K.Patel and treatment by him.    Park Pope, NP-C

## 2021-09-02 NOTE — Progress Notes (Signed)
[facility]               IP PROGRESS NOTE      [name], [name]  Date of Admission:  08/30/2021  Date of Birth:  07-May-1976  Date of Service:  09/02/2021    Hospital Day:  LOS: 3 days       Subjective:   Patient resting in bed at 10 mg.  Patient's oxygen saturation 94% on room air.  Patient reports his bowel movements are improving.  He is still having some epigastric type pain.  He states it is worsened with eating.  He reports his hearing EGD tomorrow.  Tolerating his current diet.  No other complaints voiced.    Vital Signs:  Temp (24hrs) Max:36.9 C (98.4 F)      Temperature: 36.4 C (97.6 F)  BP (Non-Invasive): 133/78  MAP (Non-Invasive): 107 mmHG  Heart Rate: 89  Respiratory Rate: 20  SpO2: 94 %    Current Medications:  acetaminophen (TYLENOL) tablet, 650 mg, Oral, Q4H PRN  albuterol (PROVENTIL) 2.5 mg / 3 mL (0.083%) neb solution, 2.5 mg, Nebulization, Q6H  albuterol (PROVENTIL) 2.5 mg / 3 mL (0.083%) neb solution, 2.5 mg, Nebulization, Q4H PRN  aluminum-magnesium hydroxide-simethicone (MAG-AL PLUS) 200-200-20 mg per 5 mL oral liquid, 10 mL, Oral, Q4H PRN  cholestyramine-sucrose (QUESTRAN) 4 gram packet, 1 Packet, Oral, Daily  folic acid (FOLVITE) tablet, 1 mg, Oral, Daily  guaiFENesin '100mg'$  per 50m oral liquid - for cough (expectorant), 200 mg, Oral, Q4H PRN  levoFLOXacin (LEVAQUIN) 500 mg in D5W 100 mL premix IVPB, 500 mg, Intravenous, Q24H  LORazepam (ATIVAN) 2 mg/mL injection, 0.5 mg, Intravenous, Q4H PRN  magnesium sulfate 1 G in D5W 100 mL premix IVPB, 1 g, Intravenous, Q1H  methocarbamol (ROBAXIN) tablet, 750 mg, Oral, 3x/day  methylPREDNISolone sod succ (SOLU-MEDROL) 40 mg/mL injection, 20 mg, Intravenous, Q8H  metroNIDAZOLE (FLAGYL) 500 mg in NS 100 mL premix IVPB, 500 mg, Intravenous, Q12H  morphine 4 mg/mL injection, 4 mg, Intravenous, Q4H PRN  nicotine (NICODERM CQ) transdermal patch (mg/24 hr), 21 mg, Transdermal, Daily  ondansetron (ZOFRAN) 2 mg/mL injection, 4 mg,  Intravenous, Q6H PRN  oxyCODONE-acetaminophen (PERCOCET) 5-'325mg'$  per tablet, 1 Tablet, Oral, Q4H PRN  pantoprazole (PROTONIX) injection, 40 mg, Intravenous, Daily  prochlorperazine (COMPAZINE) 5 mg/mL injection, 10 mg, Intravenous, Q6H PRN  ramelteon (ROZEREM) tablet, 8 mg, Oral, HS PRN        Current Orders:  Active Orders   Diet    DIET FULL LIQUID Do you want to initiate MNT Protocol? Yes     Frequency: All Meals     Number of Occurrences: 1 Occurrences    DIET NPO - SPECIFIC DATE & TIME     Frequency: ALL MEALS (NPO ONLY)     Number of Occurrences: 1 Occurrences   Nursing    ACTIVITY Activity: OOB WITH ASSISTANCE     Frequency: UNTIL DISCONTINUED     Number of Occurrences: Until Specified    ENCOURAGE AMBULATION AND ROM EXERCISES     Frequency: UNTIL DISCONTINUED     Number of Occurrences: Until Specified    Notify MD Vital Signs     Frequency: PRN     Number of Occurrences: Until Specified    PT IS LOW RISK FOR VENOUS THROMBOEMBOLISM     Frequency: CONTINUOUS     Number of Occurrences: Until Specified    PULSE OXIMETRY Q4H     Frequency: Q4H     Number of Occurrences:  Until Specified    TELEMETRY MONITORING - Continuous     Frequency: CONTINUOUS     Number of Occurrences: Until Specified    VITAL SIGNS QSHIFT     Frequency: QSHIFT     Number of Occurrences: Until Specified   Code Status    FULL CODE     Frequency: CONTINUOUS     Number of Occurrences: Until Specified   Consult    IP CONSULT TO GASTROENTEROLOGY Requested Provider; Netty Starring     Frequency: ONE TIME     Number of Occurrences: 1 Occurrences     Order Comments: Nurse may enter secondary order if modifications/details needed.       IP CONSULT TO GENERAL SURGERY Requested Provider; HOPKINS, ERIC S; 08/31/2021; 12:04 PM     Frequency: ONE TIME     Number of Occurrences: 1 Occurrences     Order Comments: Nurse may enter secondary order if modifications/details needed.      Respiratory Care    OXYGEN - NASAL CANNULA     Frequency: CONTINUOUS      Number of Occurrences: Until Specified     Order Comments: Flowrate should not exeed 6L/min except [facility] facility.          Vascular Ultrasound    PERIPHERAL VENOUS DUPLEX - LOWER     Frequency: ONE TIME     Number of Occurrences: 1 Occurrences     Scheduling Instructions:               Case Request    CASE REQUEST SURGICAL: GASTROSCOPY WITH BIOPSY     Frequency: ONE TIME     Number of Occurrences: 1 Occurrences   Medications    acetaminophen (TYLENOL) tablet     Frequency: Q4H PRN     Dose: 650 mg     Route: Oral    albuterol (PROVENTIL) 2.5 mg / 3 mL (0.083%) neb solution     Frequency: Q6H     Dose: 2.5 mg     Route: Nebulization    albuterol (PROVENTIL) 2.5 mg / 3 mL (0.083%) neb solution     Frequency: Q4H PRN     Dose: 2.5 mg     Route: Nebulization    aluminum-magnesium hydroxide-simethicone (MAG-AL PLUS) 200-200-20 mg per 5 mL oral liquid     Frequency: Q4H PRN     Dose: 10 mL     Route: Oral    cholestyramine-sucrose (QUESTRAN) 4 gram packet     Frequency: Daily     Dose: 1 Packet     Route: Oral    folic acid (FOLVITE) tablet     Frequency: Daily     Dose: 1 mg     Route: Oral    guaiFENesin '100mg'$  per 45m oral liquid - for cough (expectorant)     Frequency: Q4H PRN     Dose: 200 mg     Route: Oral    levoFLOXacin (LEVAQUIN) 500 mg in D5W 100 mL premix IVPB     Frequency: Q24H     Dose: 500 mg     Route: Intravenous    LORazepam (ATIVAN) 2 mg/mL injection     Frequency: Q4H PRN     Dose: 0.5 mg     Route: Intravenous    magnesium sulfate 1 G in D5W 100 mL premix IVPB     Frequency: Q1H     Dose: 1 g     Route: Intravenous    methocarbamol (ROBAXIN) tablet  Frequency: 3x/day     Dose: 750 mg     Route: Oral    methylPREDNISolone sod succ (SOLU-MEDROL) 40 mg/mL injection     Frequency: Q8H     Dose: 20 mg     Route: Intravenous    metroNIDAZOLE (FLAGYL) 500 mg in NS 100 mL premix IVPB     Frequency: Q12H     Dose: 500 mg     Route: Intravenous    morphine 4 mg/mL injection     Frequency: Q4H PRN     Dose: 4  mg     Route: Intravenous    nicotine (NICODERM CQ) transdermal patch (mg/24 hr)     Frequency: Daily     Dose: 21 mg     Route: Transdermal    ondansetron (ZOFRAN) 2 mg/mL injection     Frequency: Q6H PRN     Dose: 4 mg     Route: Intravenous    oxyCODONE-acetaminophen (PERCOCET) 5-'325mg'$  per tablet     Frequency: Q4H PRN     Dose: 1 Tablet     Route: Oral    pantoprazole (PROTONIX) injection     Frequency: Daily     Dose: 40 mg     Route: Intravenous    prochlorperazine (COMPAZINE) 5 mg/mL injection     Frequency: Q6H PRN     Dose: 10 mg     Route: Intravenous    ramelteon (ROZEREM) tablet     Frequency: HS PRN     Dose: 8 mg     Route: Oral        Review of Systems:  Focused review of system was completed. Refer to the HPI for ROS details.     Today's Physical Exam:  Physical Exam  Constitutional:       General: He is not in acute distress.     Appearance: Normal appearance.   Cardiovascular:      Rate and Rhythm: Normal rate and regular rhythm.      Pulses: Normal pulses.   Pulmonary:      Effort: Pulmonary effort is normal.      Breath sounds: Normal breath sounds. No wheezing.   Abdominal:      General: Bowel sounds are normal. There is distension.      Comments: Distended, less firm than yesterday.   Tender epigastric area   Musculoskeletal:         General: Swelling present.   Skin:     General: Skin is warm and dry.   Neurological:      General: No focal deficit present.      Mental Status: He is alert.   Psychiatric:         Mood and Affect: Mood normal.            I/O:  I/O last 24 hours:      Intake/Output Summary (Last 24 hours) at 09/02/2021 1119  Last data filed at 09/02/2021 0918  Gross per 24 hour   Intake 1586.67 ml   Output 2 ml   Net 1584.67 ml     I/O current shift:  03/16 0700 - 03/16 1859  In: 100   Out: -       Labs:  Reviewed: I have reviewed all lab results.  Lab Results Today:    Results for orders placed or performed during the hospital encounter of 08/30/21 (from the past 24 hour(s))    COMPREHENSIVE METABOLIC PANEL, NON-FASTING   Result Value Ref Range  SODIUM 136 136 - 145 mmol/L    POTASSIUM 3.5 3.5 - 5.1 mmol/L    CHLORIDE 99 98 - 107 mmol/L    CO2 TOTAL 28 21 - 31 mmol/L    ANION GAP 9 (L) 10 - 20 mmol/L    BUN 4 (L) 7 - 25 mg/dL    CREATININE 0.50 (L) 0.60 - 1.30 mg/dL    BUN/CREA RATIO 8 6 - 22    ESTIMATED GFR 130 >59 mL/min/1.46m2    ALBUMIN 3.5 3.5 - 5.7 g/dL    CALCIUM 8.7 8.6 - 10.3 mg/dL    GLUCOSE 156 (H) 74 - 109 mg/dL    ALKALINE PHOSPHATASE 96 34 - 104 U/L    ALT (SGPT) 25 7 - 52 U/L    AST (SGOT) 58 (H) 13 - 39 U/L    BILIRUBIN TOTAL 0.7 0.3 - 1.2 mg/dL    PROTEIN TOTAL 6.7 6.4 - 8.9 g/dL    ALBUMIN/GLOBULIN RATIO 1.1 0.8 - 1.4    OSMOLALITY, CALCULATED 272 270 - 290 mOsm/kg    CALCIUM, CORRECTED 9.2 8.9 - 10.8 mg/dL    GLOBULIN 3.2 2.9 - 5.4   MAGNESIUM   Result Value Ref Range    MAGNESIUM 2.1 1.9 - 2.7 mg/dL   CBC WITH DIFF   Result Value Ref Range    WBCS UNCORRECTED 17.2 x10^3/uL    WBC 17.2 (H) 4.0 - 10.5 x10^3/uL    RBC 3.62 (L) 4.20 - 6.00 x10^6/uL    HGB 12.4 (L) 13.5 - 18.0 g/dL    HCT 36.1 (L) 42.0 - 51.0 %    MCV 99.9 (H) 78.0 - 99.0 fL    MCH 34.3 (H) 27.0 - 32.0 pg    MCHC 34.3 32.0 - 36.0 g/dL    RDW 17.5 (H) 11.6 - 14.8 %    PLATELETS 508 (H) 140 - 440 x10^3/uL    MPV 7.0 (L) 7.4 - 10.4 fL    NEUTROPHIL % 85 (H) 40 - 76 %    LYMPHOCYTE % 10 (L) 25 - 45 %    MONOCYTE % 6 0 - 12 %    EOSINOPHIL % 0 0 - 7 %    BASOPHIL % 0 0 - 3 %    NEUTROPHIL # 14.60 4.10 - 29.00 x10^3/uL    LYMPHOCYTE # 1.60 (L) 2.10 - 11.00 x10^3/uL    MONOCYTE # 1.00 0.00 - 4.10 x10^3/uL    EOSINOPHIL # 0.00 0.00 - 2.40 x10^3/uL    BASOPHIL # 0.00 0.00 - 2.50 x10^3/uL       Images:   No results found.       Problem List:  Active Hospital Problems   (*Primary Problem)    Diagnosis    *Other ulcerative colitis with rectal bleeding (CMS HCC)    Tobacco use disorder    Continuous tobacco abuse    HTN (hypertension)    Lactic acidosis    COPD (chronic obstructive pulmonary disease) (CMS HCC)     Hypokalemia    Leucocytosis       Assessment/ Plan:   1. Ulcerative Colitis with rectal bleeding:               Continue Levaquin, flagyl, and solu-medrol.              H/hHstable.               Dr. KKeith Rakeconsulted for GI. Recommendations appreciated.  Dr. Georgiann Cocker consulted for surgery eval. Recommendations appreciated.               Full liquid diet.               CDT negative. Stool culture pending.   2. Electrolyte Imbalance:             Resolved. Monitor.   3. Lactic acidosis:              Improving.   4. COPD:              Continue nebs. Chronic.   5. Other:              Plans for EGD in am.   Lower extremity US to r/o DVT due to edema and pain.    Further treatment adjustments will be made as indicated. See orders.              Hospitalist personally evaluated and examined the patient in conjunction with the MLP and agree with the  assessments, treatment plan and disposition of the patient as recorded by the [facility].     Hospitalist personally evaluated and examined the patient in conjunction with the MLP and agree with the assessments, treatment plan and disposition of the patient as recorded by the [facility].     DVT/PE Prophylaxis: Not indicated, low risk for VTE    Joselyn Glassman, FNP-BC      I evaluated the patient independent of the APP and plan was discussed and agreed upon.     Celene Kras, DO

## 2021-09-02 NOTE — Care Plan (Signed)
Pt is alert and oriented. Pt  has had less diarrhea noted.  Pt started on the questran and has done better.  Still have issues with feeling full and nauseated.  Pt is for a EGD  tomorrow .  Pt is having swelling to the legs still and did have a [place] today to eval. Francoise Ceo, RN

## 2021-09-02 NOTE — Nurses Notes (Signed)
Pt resting in the bed. Assessment completed.  Pt states that abd is still distended and slt firm. States that he feels full after eating. Francoise Ceo, RN

## 2021-09-02 NOTE — Nurses Notes (Signed)
Pt resting in the bed.  No distress noted. Remains to have some fullness after meals. No diarrhea noted. Francoise Ceo, RN

## 2021-09-03 ENCOUNTER — Inpatient Hospital Stay (HOSPITAL_COMMUNITY): Payer: Medicaid Other | Admitting: Certified Registered"

## 2021-09-03 ENCOUNTER — Encounter (HOSPITAL_COMMUNITY)
Admission: EM | Disposition: A | Discharge status: Home / Self Care | Payer: Self-pay | Source: Home / Self Care | Attending: Internal Medicine

## 2021-09-03 DIAGNOSIS — K51919 Ulcerative colitis, unspecified with unspecified complications: Secondary | ICD-10-CM

## 2021-09-03 DIAGNOSIS — D72829 Elevated white blood cell count, unspecified: Secondary | ICD-10-CM

## 2021-09-03 LAB — E. COLI SHIGA TOXIN
SHIGA TOXIN 1: NEGATIVE
SHIGA TOXIN 2: NEGATIVE

## 2021-09-03 SURGERY — GASTROSCOPY WITH BIOPSY
Anesthesia: General | Wound class: Clean Contaminated Wounds-The respiratory, GI, Genital, or urinary

## 2021-09-03 MED ORDER — LIDOCAINE (PF) 100 MG/5 ML (2 %) INTRAVENOUS SYRINGE
INJECTION | Freq: Once | INTRAVENOUS | Status: DC | PRN
Start: 2021-09-03 — End: 2021-09-03
  Administered 2021-09-03: 100 mg via INTRAVENOUS

## 2021-09-03 MED ORDER — PROPOFOL 10 MG/ML IV BOLUS
INJECTION | Freq: Once | INTRAVENOUS | Status: DC | PRN
Start: 2021-09-03 — End: 2021-09-03
  Administered 2021-09-03: 50 mg via INTRAVENOUS
  Administered 2021-09-03: 100 mg via INTRAVENOUS
  Administered 2021-09-03: 50 mg via INTRAVENOUS

## 2021-09-03 MED ORDER — PANTOPRAZOLE 40 MG TABLET,DELAYED RELEASE
40.0000 mg | DELAYED_RELEASE_TABLET | Freq: Two times a day (BID) | ORAL | Status: DC
Start: 2021-09-03 — End: 2021-09-06
  Administered 2021-09-03 – 2021-09-06 (×7): 40 mg via ORAL
  Filled 2021-09-03 (×8): qty 1

## 2021-09-03 MED ORDER — FAMOTIDINE 40 MG TABLET
40.0000 mg | ORAL_TABLET | Freq: Every evening | ORAL | Status: DC
Start: 2021-09-03 — End: 2021-09-06
  Administered 2021-09-03 – 2021-09-05 (×3): 40 mg via ORAL
  Filled 2021-09-03 (×3): qty 1

## 2021-09-03 MED ORDER — SODIUM CHLORIDE 0.9 % INTRAVENOUS SOLUTION
INTRAVENOUS | Status: DC | PRN
Start: 2021-09-03 — End: 2021-09-03
  Administered 2021-09-03: 0 via INTRAVENOUS

## 2021-09-03 SURGICAL SUPPLY — 3 items
FORCEPS BIOPSY MICROMESH TTH STREAMLINE CATH NEEDLE 240CM 2.4MM RJ 4 SS LRG CPC STRL DISP ORNG 2.8MM (ENDOSCOPIC SUPPLIES) ×1 IMPLANT
FORCEPS BIOPSY NEEDLE 240CM 2._4MM RJ 4 2.8MM LRG CPC STRL (INSTRUMENTS ENDOMECHANICAL) ×1
USE ITEM 60432 FORCEPS BIOPSY MICROMESH TTH STREAMLINE CATH NEEDLE 240CM 2.4MM RJ 4 SS LRG CPC STRL DISP ORNG 2.8MM (ENDOSCOPIC SUPPLIES) ×1 IMPLANT

## 2021-09-03 NOTE — Progress Notes (Signed)
[facility]  IP PROGRESS NOTE      [name]  Date of Admission:  08/30/2021  Date of Birth:  03/05/1976  Date of Service:  09/03/2021    Hospital Day:  LOS: 4 days       Subjective:   Pt seen and examined. He continues to have some epigastric pain, which is unchanged compared to yesterday. No n/v, though pt has been npo for EGD today. He has had improvement in diarrhea since beginning cholesytramine. No new complaints per pt or major overnight events per nursing.  Vital Signs:  Temp (24hrs) Max:36.8 C (98.3 F)      Temperature: 36.5 C (97.7 F)  BP (Non-Invasive): (!) 125/92  MAP (Non-Invasive): 107 mmHG  Heart Rate: 80  Respiratory Rate: 20  SpO2: 95 %    Current Medications:  acetaminophen (TYLENOL) tablet, 650 mg, Oral, Q4H PRN  albuterol (PROVENTIL) 2.5 mg / 3 mL (0.083%) neb solution, 2.5 mg, Nebulization, Q6H  albuterol (PROVENTIL) 2.5 mg / 3 mL (0.083%) neb solution, 2.5 mg, Nebulization, Q4H PRN  aluminum-magnesium hydroxide-simethicone (MAG-AL PLUS) 200-200-20 mg per 5 mL oral liquid, 10 mL, Oral, Q4H PRN  cholestyramine-sucrose (QUESTRAN) 4 gram packet, 1 Packet, Oral, Daily  famotidine (PEPCID) tablet, 40 mg, Oral, NIGHTLY  folic acid (FOLVITE) tablet, 1 mg, Oral, Daily  guaiFENesin '100mg'$  per 82m oral liquid - for cough (expectorant), 200 mg, Oral, Q4H PRN  levoFLOXacin (LEVAQUIN) 500 mg in D5W 100 mL premix IVPB, 500 mg, Intravenous, Q24H  LORazepam (ATIVAN) 2 mg/mL injection, 0.5 mg, Intravenous, Q4H PRN  methocarbamol (ROBAXIN) tablet, 750 mg, Oral, 3x/day  methylPREDNISolone sod succ (SOLU-MEDROL) 40 mg/mL injection, 20 mg, Intravenous, Q8H  metroNIDAZOLE (FLAGYL) 500 mg in NS 100 mL premix IVPB, 500 mg, Intravenous, Q12H  morphine 4 mg/mL injection, 4 mg, Intravenous, Q4H PRN  nicotine (NICODERM CQ) transdermal patch (mg/24 hr), 21 mg, Transdermal, Daily  ondansetron (ZOFRAN) 2 mg/mL injection, 4 mg, Intravenous, Q6H PRN  oxyCODONE-acetaminophen (PERCOCET) 5-'325mg'$   per tablet, 1 Tablet, Oral, Q4H PRN  pantoprazole (PROTONIX) delayed release tablet, 40 mg, Oral, 2x/day AC  prochlorperazine (COMPAZINE) 5 mg/mL injection, 10 mg, Intravenous, Q6H PRN  ramelteon (ROZEREM) tablet, 8 mg, Oral, HS PRN        Current Orders:  Active Orders   Lab    CBC     Frequency: ONE TIME     Number of Occurrences: 1 Occurrences    CBC     Frequency: ONE TIME     Number of Occurrences: 1 Occurrences    CBC     Frequency: ONE TIME     Number of Occurrences: 1 Occurrences    COMPREHENSIVE METABOLIC PANEL, NON-FASTING     Frequency: 0530 - AM DRAW     Number of Occurrences: 1 Occurrences    COMPREHENSIVE METABOLIC PANEL, NON-FASTING     Frequency: ONE TIME     Number of Occurrences: 1 Occurrences    COMPREHENSIVE METABOLIC PANEL, NON-FASTING     Frequency: ONE TIME     Number of Occurrences: 1 Occurrences    COMPREHENSIVE METABOLIC PANEL, NON-FASTING     Frequency: ONE TIME     Number of Occurrences: 1 Occurrences    MAGNESIUM     Frequency: ONE TIME     Number of Occurrences: 1 Occurrences    MAGNESIUM     Frequency: ONE TIME     Number of Occurrences: 1 Occurrences    MAGNESIUM     Frequency:  ONE TIME     Number of Occurrences: 1 Occurrences   Diet    DIET GI SOFT/BLAND Do you want to initiate MNT Protocol? Yes; Additional modifications/limitations: LOW RESIDUE     Frequency: All Meals     Number of Occurrences: 1 Occurrences   Nursing    ACTIVITY Activity: OOB WITH ASSISTANCE     Frequency: UNTIL DISCONTINUED     Number of Occurrences: Until Specified    ENCOURAGE AMBULATION AND ROM EXERCISES     Frequency: UNTIL DISCONTINUED     Number of Occurrences: Until Specified    Notify MD Vital Signs     Frequency: PRN     Number of Occurrences: Until Specified    PT IS LOW RISK FOR VENOUS THROMBOEMBOLISM     Frequency: CONTINUOUS     Number of Occurrences: Until Specified    PULSE OXIMETRY Q4H     Frequency: Q4H     Number of Occurrences: Until Specified    TELEMETRY MONITORING - Continuous     Frequency:  CONTINUOUS     Number of Occurrences: Until Specified    VITAL SIGNS QSHIFT     Frequency: QSHIFT     Number of Occurrences: Until Specified   Code Status    FULL CODE     Frequency: CONTINUOUS     Number of Occurrences: Until Specified   Consult    IP CONSULT TO GASTROENTEROLOGY Requested Provider; Netty Starring     Frequency: ONE TIME     Number of Occurrences: 1 Occurrences     Order Comments: Nurse may enter secondary order if modifications/details needed.       IP CONSULT TO GENERAL SURGERY Requested Provider; HOPKINS, ERIC S; 08/31/2021; 12:04 PM     Frequency: ONE TIME     Number of Occurrences: 1 Occurrences     Order Comments: Nurse may enter secondary order if modifications/details needed.      Respiratory Care    OXYGEN - NASAL CANNULA     Frequency: CONTINUOUS     Number of Occurrences: Until Specified     Order Comments: Flowrate should not exeed 6L/min except [facility] facility.          Case Request    CASE REQUEST SURGICAL: GASTROSCOPY WITH BIOPSY     Frequency: ONE TIME     Number of Occurrences: 1 Occurrences   Medications    acetaminophen (TYLENOL) tablet     Frequency: Q4H PRN     Dose: 650 mg     Route: Oral    albuterol (PROVENTIL) 2.5 mg / 3 mL (0.083%) neb solution     Frequency: Q6H     Dose: 2.5 mg     Route: Nebulization    albuterol (PROVENTIL) 2.5 mg / 3 mL (0.083%) neb solution     Frequency: Q4H PRN     Dose: 2.5 mg     Route: Nebulization    aluminum-magnesium hydroxide-simethicone (MAG-AL PLUS) 200-200-20 mg per 5 mL oral liquid     Frequency: Q4H PRN     Dose: 10 mL     Route: Oral    cholestyramine-sucrose (QUESTRAN) 4 gram packet     Frequency: Daily     Dose: 1 Packet     Route: Oral    famotidine (PEPCID) tablet     Frequency: NIGHTLY     Dose: 40 mg     Route: Oral    folic acid (FOLVITE) tablet     Frequency: Daily  Dose: 1 mg     Route: Oral    guaiFENesin '100mg'$  per 62m oral liquid - for cough (expectorant)     Frequency: Q4H PRN     Dose: 200 mg     Route: Oral    levoFLOXacin  (LEVAQUIN) 500 mg in D5W 100 mL premix IVPB     Frequency: Q24H     Dose: 500 mg     Route: Intravenous    LORazepam (ATIVAN) 2 mg/mL injection     Frequency: Q4H PRN     Dose: 0.5 mg     Route: Intravenous    methocarbamol (ROBAXIN) tablet     Frequency: 3x/day     Dose: 750 mg     Route: Oral    methylPREDNISolone sod succ (SOLU-MEDROL) 40 mg/mL injection     Frequency: Q8H     Dose: 20 mg     Route: Intravenous    metroNIDAZOLE (FLAGYL) 500 mg in NS 100 mL premix IVPB     Frequency: Q12H     Dose: 500 mg     Route: Intravenous    morphine 4 mg/mL injection     Frequency: Q4H PRN     Dose: 4 mg     Route: Intravenous    nicotine (NICODERM CQ) transdermal patch (mg/24 hr)     Frequency: Daily     Dose: 21 mg     Route: Transdermal    ondansetron (ZOFRAN) 2 mg/mL injection     Frequency: Q6H PRN     Dose: 4 mg     Route: Intravenous    oxyCODONE-acetaminophen (PERCOCET) 5-'325mg'$  per tablet     Frequency: Q4H PRN     Dose: 1 Tablet     Route: Oral    pantoprazole (PROTONIX) delayed release tablet     Frequency: 2x/day AC     Dose: 40 mg     Route: Oral    prochlorperazine (COMPAZINE) 5 mg/mL injection     Frequency: Q6H PRN     Dose: 10 mg     Route: Intravenous    ramelteon (ROZEREM) tablet     Frequency: HS PRN     Dose: 8 mg     Route: Oral        Review of Systems:  Focused review of system was completed. Refer to the HPI for ROS details.     Today's Physical Exam:  Physical Exam  Cardiovascular:      Rate and Rhythm: Normal rate and regular rhythm.   Pulmonary:      Effort: Pulmonary effort is normal. No respiratory distress.      Breath sounds: Normal breath sounds.   Abdominal:      General: Bowel sounds are normal.      Palpations: Abdomen is soft.      Tenderness: There is no abdominal tenderness. There is no guarding.   Musculoskeletal:         General: No swelling.   Skin:     General: Skin is warm and dry.   Neurological:      General: No focal deficit present.      Mental Status: He is alert and oriented  to person, place, and time.              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 Continue iv abx and iv steroids. Wbc increased, which may be due to steroid effect. Await further recommendations from GI team. The Hospitalist personally evaluated and examined the patient in conjunction with the MLP and agree with the assessments, treatment plan and disposition of the patient as recorded by the [facility].    Lovenia Kim, PA-C    DVT/PE Prophylaxis: Not indicated, low risk for VTE      I evaluated the patient independent of the APP and plan was discussed and agreed upon.     Celene Kras, DO

## 2021-09-03 NOTE — Care Plan (Signed)
Nutrition referral for diet education.    Pt reports multiple GI issues (vomiting, reflux, diarrhea).  Nutrition tips discussed. Reviewed full liquid and then soft gi/low residue diet.  Questions answered.  Handout provided.      Zenon Mayo, RDLD  09/03/2021, 15:08

## 2021-09-03 NOTE — Anesthesia Preprocedure Evaluation (Signed)
ANESTHESIA PRE-OP EVALUATION  Planned Procedure: EGD  Review of Systems    history of awareness of surgery under anesthesia   anesthesia history negative     patient summary reviewed  nursing notes reviewed        Pulmonary   COPD and asthma,   Cardiovascular    Hypertension, angina, no pattern (unstable), ECG reviewed and hyperlipidemia , Exercise Tolerance: > or = 4 METS        GI/Hepatic/Renal    hiatal hernia, GERD, PUD and liver disease        Endo/Other    anemia and obesity,      Neuro/Psych/MS   negative neuro/psych ROS,      Cancer    negative hematology/oncology ROS,                   Physical Assessment      Airway       Mallampati: II      Mouth Opening: good.            Dental           (+) poor dentition           Pulmonary           Cardiovascular             Other findings            Plan  ASA 3     Planned anesthesia type: general     total intravenous anesthesia                        Anesthetic plan and risks discussed with patient             Patient's NPO status is appropriate for Anesthesia.

## 2021-09-03 NOTE — OR Surgeon (Signed)
[facility]      EGD NOTE               Patient Name: [name]  Age:  46 y.o.  Sex:  male  MRN:  [id number]  CSN:  [id number]  Date of Service: 08/30/2021  Date of Birth: 1975-11-06    Medications Given: Per Nurse Anesthesist    Equipment Used: Olympus [id number]    Date: 09/03/21     Indications:    Abdominal pain, chronic heartburn, nausea & vomiting    The scope was passed into the esophagus without difficulty under direct visualization. There was moderate erythema at the distal esophagus. Trace esophageal varices. A small hiatal hernia was seen. The mucosa in the cardia appeared normal. The diaphragmatic impingement on retroflexion view appeared normal.  The Z line appeared irregular and was consistent with Barrett's metaplasia.  A 1 cm Barrett's type mucosa segment was seen.      The mucosa in the fundus was normal. The mucosa in the body and antrum had moderate erythema. The mucosa in the duodenal bulb was normal. The mucosa past the duodenal bulb to the 2nd portion of the duodenum appeared normal. The ampulla was not visualized.      Biopsies were taken from antrum, esophagus at 44 cm, and GE junction at 45 cm. GE junction visualized with Narrow Band Imaging. Photograph(s) Taken: Yes;  Blood loss:  trace  (< 1 ml)     Impression:  1. Mild distal esophagitis, grade 1   2. Barrett's metaplasia as described above  3. Trace esophageal varices  4. Small hiatal hernia  5. Moderate gastritis   6. Biopsies taken     Recommendations:  1. Begin Protonix 40 mg po bid, before breakfast and before dinner  2. Begin Famotidine 40 mg po bid, before lunch and q hs  3. Maintain antireflux precautions including head of bed elevation.  4. Await results of biopsies   5. Repeat EGD in 3 years with MAC to reassess the Barrett's esophagus        Netty Starring, MD

## 2021-09-03 NOTE — Nurses Notes (Signed)
Pt left to day surgery via stretcher via transporter for EGD

## 2021-09-03 NOTE — Anesthesia Postprocedure Evaluation (Signed)
Anesthesia Post Op Evaluation    Patient: [name] [facility]  Procedure(s):  EGD WITH BIOPSY    Last Vitals:Temperature: 36.7 C (98 F) (09/03/21 1314)  Heart Rate: 98 (09/03/21 1314)  BP (Non-Invasive): 117/72 (09/03/21 1314)  Respiratory Rate: 17 (09/03/21 1314)  SpO2: 94 % (09/03/21 1314)    No notable events documented.    Patient is sufficiently recovered from the effects of anesthesia to participate in the evaluation and has returned to their pre-procedure level.  Patient location during evaluation: PACU       Patient participation: complete - patient participated  Level of consciousness: awake and alert and responsive to verbal stimuli    Pain management: adequate  Airway patency: patent    Anesthetic complications: no  Cardiovascular status: acceptable  Respiratory status: acceptable  Hydration status: acceptable  Patient post-procedure temperature: Pt Normothermic   PONV Status: Absent

## 2021-09-04 LAB — MAGNESIUM: MAGNESIUM: 2.3 mg/dL (ref 1.9–2.7)

## 2021-09-04 LAB — COMPREHENSIVE METABOLIC PANEL, NON-FASTING
ALBUMIN/GLOBULIN RATIO: 1.2 (ref 0.8–1.4)
ALBUMIN: 3.7 g/dL (ref 3.5–5.7)
ALKALINE PHOSPHATASE: 89 U/L (ref 34–104)
ALT (SGPT): 64 U/L — ABNORMAL HIGH (ref 7–52)
ANION GAP: 9 mmol/L — ABNORMAL LOW (ref 10–20)
AST (SGOT): 130 U/L — ABNORMAL HIGH (ref 13–39)
BILIRUBIN TOTAL: 0.7 mg/dL (ref 0.3–1.2)
BUN/CREA RATIO: 13 (ref 6–22)
BUN: 7 mg/dL (ref 7–25)
CALCIUM, CORRECTED: 8.9 mg/dL (ref 8.9–10.8)
CALCIUM: 8.6 mg/dL (ref 8.6–10.3)
CHLORIDE: 101 mmol/L (ref 98–107)
CO2 TOTAL: 26 mmol/L (ref 21–31)
CREATININE: 0.55 mg/dL — ABNORMAL LOW (ref 0.60–1.30)
ESTIMATED GFR: 126 mL/min/{1.73_m2} (ref >59)
GLOBULIN: 3 (ref 2.9–5.4)
GLUCOSE: 153 mg/dL — ABNORMAL HIGH (ref 74–109)
OSMOLALITY, CALCULATED: 273 mOsm/kg (ref 270–290)
POTASSIUM: 3.7 mmol/L (ref 3.5–5.1)
PROTEIN TOTAL: 6.7 g/dL (ref 6.4–8.9)
SODIUM: 136 mmol/L (ref 136–145)

## 2021-09-04 LAB — CBC
HCT: 35.5 % — ABNORMAL LOW (ref 42.0–51.0)
HGB: 12 g/dL — ABNORMAL LOW (ref 13.5–18.0)
MCH: 34.3 pg — ABNORMAL HIGH (ref 27.0–32.0)
MCHC: 33.9 g/dL (ref 32.0–36.0)
MCV: 101.4 fL — ABNORMAL HIGH (ref 78.0–99.0)
MPV: 6.9 fL — ABNORMAL LOW (ref 7.4–10.4)
PLATELETS: 487 10*3/uL — ABNORMAL HIGH (ref 140–440)
RBC: 3.5 10*6/uL — ABNORMAL LOW (ref 4.20–6.00)
RDW: 17.4 % — ABNORMAL HIGH (ref 11.6–14.8)
WBC: 13.7 10*3/uL — ABNORMAL HIGH (ref 4.0–10.5)
WBCS UNCORRECTED: 13.7 10*3/uL

## 2021-09-04 LAB — ROUTINE STOOL CULTURE (INCLUDING E. COLI SHIGA TOXIN): STOOL CULTURE: NORMAL

## 2021-09-04 LAB — C-REACTIVE PROTEIN (CRP): C-REACTIVE PROTEIN (CRP): 0.8 mg/dL — ABNORMAL HIGH (ref 0.1–0.5)

## 2021-09-04 NOTE — Nurses Notes (Signed)
DID APPLY NEW  NICOTENE  PATCH  BUT  UNABLE  TO  GET  SCAN DONE  CORRECTLY  TO  SHOW  IN  MAR.

## 2021-09-04 NOTE — Progress Notes (Signed)
[facility]  IP PROGRESS NOTE      [name], [name]  Date of Admission:  08/30/2021  Date of Birth:  1976/04/09  Date of Service:  09/04/2021    Hospital Day:  LOS: 5 days       Subjective:   Pt seen and examined. States he is tolerating bland diet. No n/v/d. Denies significant ab pain, but continues to endorse abdominal distention. No cp or sob. Has leg edema that he states worsened last night. Denies orthopnea/pnd. EGD yesterday verbal report is positive for barretts esophagus and esophagitis. No major overnight events per nursing.  Vital Signs:  Temp (24hrs) Max:36.9 C (98.5 F)      Temperature: 35.8 C (96.5 F)  BP (Non-Invasive): 128/87  MAP (Non-Invasive): 1 mmHG  Heart Rate: 83  Respiratory Rate: 20  SpO2: 97 %    Current Medications:  acetaminophen (TYLENOL) tablet, 650 mg, Oral, Q4H PRN  albuterol (PROVENTIL) 2.5 mg / 3 mL (0.083%) neb solution, 2.5 mg, Nebulization, Q6H  albuterol (PROVENTIL) 2.5 mg / 3 mL (0.083%) neb solution, 2.5 mg, Nebulization, Q4H PRN  aluminum-magnesium hydroxide-simethicone (MAG-AL PLUS) 200-200-20 mg per 5 mL oral liquid, 10 mL, Oral, Q4H PRN  cholestyramine-sucrose (QUESTRAN) 4 gram packet, 1 Packet, Oral, Daily  famotidine (PEPCID) tablet, 40 mg, Oral, NIGHTLY  folic acid (FOLVITE) tablet, 1 mg, Oral, Daily  guaiFENesin '100mg'$  per 64m oral liquid - for cough (expectorant), 200 mg, Oral, Q4H PRN  levoFLOXacin (LEVAQUIN) 500 mg in D5W 100 mL premix IVPB, 500 mg, Intravenous, Q24H  LORazepam (ATIVAN) 2 mg/mL injection, 0.5 mg, Intravenous, Q4H PRN  methocarbamol (ROBAXIN) tablet, 750 mg, Oral, 3x/day  methylPREDNISolone sod succ (SOLU-MEDROL) 40 mg/mL injection, 20 mg, Intravenous, Q8H  metroNIDAZOLE (FLAGYL) 500 mg in NS 100 mL premix IVPB, 500 mg, Intravenous, Q12H  morphine 4 mg/mL injection, 4 mg, Intravenous, Q4H PRN  nicotine (NICODERM CQ) transdermal patch (mg/24 hr), 21 mg, Transdermal, Daily  ondansetron (ZOFRAN) 2 mg/mL injection, 4 mg,  Intravenous, Q6H PRN  oxyCODONE-acetaminophen (PERCOCET) 5-'325mg'$  per tablet, 1 Tablet, Oral, Q4H PRN  pantoprazole (PROTONIX) delayed release tablet, 40 mg, Oral, 2x/day AC  prochlorperazine (COMPAZINE) 5 mg/mL injection, 10 mg, Intravenous, Q6H PRN  ramelteon (ROZEREM) tablet, 8 mg, Oral, HS PRN        Current Orders:  Active Orders   Lab    CBC     Frequency: ONE TIME     Number of Occurrences: 1 Occurrences    CBC     Frequency: ONE TIME     Number of Occurrences: 1 Occurrences    CBC     Frequency: ONE TIME     Number of Occurrences: 1 Occurrences    CBC     Frequency: ONE TIME     Number of Occurrences: 1 Occurrences    CBC     Frequency: ONE TIME     Number of Occurrences: 1 Occurrences    COMPREHENSIVE METABOLIC PANEL, NON-FASTING     Frequency: ONE TIME     Number of Occurrences: 1 Occurrences    COMPREHENSIVE METABOLIC PANEL, NON-FASTING     Frequency: ONE TIME     Number of Occurrences: 1 Occurrences    COMPREHENSIVE METABOLIC PANEL, NON-FASTING     Frequency: ONE TIME     Number of Occurrences: 1 Occurrences    COMPREHENSIVE METABOLIC PANEL, NON-FASTING     Frequency: ONE TIME     Number of Occurrences: 1 Occurrences    COMPREHENSIVE METABOLIC  PANEL, NON-FASTING     Frequency: ONE TIME     Number of Occurrences: 1 Occurrences    MAGNESIUM     Frequency: ONE TIME     Number of Occurrences: 1 Occurrences    MAGNESIUM     Frequency: ONE TIME     Number of Occurrences: 1 Occurrences    MAGNESIUM     Frequency: ONE TIME     Number of Occurrences: 1 Occurrences    MAGNESIUM     Frequency: ONE TIME     Number of Occurrences: 1 Occurrences    MAGNESIUM     Frequency: ONE TIME     Number of Occurrences: 1 Occurrences   Diet    DIET GI SOFT/BLAND Do you want to initiate MNT Protocol? Yes; Additional modifications/limitations: LOW RESIDUE     Frequency: All Meals     Number of Occurrences: 1 Occurrences   Nursing    ACTIVITY Activity: OOB WITH ASSISTANCE     Frequency: UNTIL DISCONTINUED     Number of Occurrences:  Until Specified    ENCOURAGE AMBULATION AND ROM EXERCISES     Frequency: UNTIL DISCONTINUED     Number of Occurrences: Until Specified    Notify MD Vital Signs     Frequency: PRN     Number of Occurrences: Until Specified    PT IS LOW RISK FOR VENOUS THROMBOEMBOLISM     Frequency: CONTINUOUS     Number of Occurrences: Until Specified    PULSE OXIMETRY Q4H     Frequency: Q4H     Number of Occurrences: Until Specified    TELEMETRY MONITORING - Continuous     Frequency: CONTINUOUS     Number of Occurrences: Until Specified    VITAL SIGNS QSHIFT     Frequency: QSHIFT     Number of Occurrences: Until Specified   Code Status    FULL CODE     Frequency: CONTINUOUS     Number of Occurrences: Until Specified   Consult    IP CONSULT TO GASTROENTEROLOGY Requested Provider; Netty Starring     Frequency: ONE TIME     Number of Occurrences: 1 Occurrences     Order Comments: Nurse may enter secondary order if modifications/details needed.       IP CONSULT TO GENERAL SURGERY Requested Provider; HOPKINS, ERIC S; 08/31/2021; 12:04 PM     Frequency: ONE TIME     Number of Occurrences: 1 Occurrences     Order Comments: Nurse may enter secondary order if modifications/details needed.      Respiratory Care    OXYGEN - NASAL CANNULA     Frequency: CONTINUOUS     Number of Occurrences: Until Specified     Order Comments: Flowrate should not exeed 6L/min except [facility] facility.          ECHO    TRANSTHORACIC ECHOCARDIOGRAM - ADULT     Frequency: ONE TIME     Number of Occurrences: 1 Occurrences     Scheduling Instructions:               Case Request    CASE REQUEST SURGICAL: GASTROSCOPY WITH BIOPSY     Frequency: ONE TIME     Number of Occurrences: 1 Occurrences   Medications    acetaminophen (TYLENOL) tablet     Frequency: Q4H PRN     Dose: 650 mg     Route: Oral    albuterol (PROVENTIL) 2.5 mg / 3 mL (0.083%) neb solution  Frequency: Q6H     Dose: 2.5 mg     Route: Nebulization    albuterol (PROVENTIL) 2.5 mg / 3 mL (0.083%) neb solution      Frequency: Q4H PRN     Dose: 2.5 mg     Route: Nebulization    aluminum-magnesium hydroxide-simethicone (MAG-AL PLUS) 200-200-20 mg per 5 mL oral liquid     Frequency: Q4H PRN     Dose: 10 mL     Route: Oral    cholestyramine-sucrose (QUESTRAN) 4 gram packet     Frequency: Daily     Dose: 1 Packet     Route: Oral    famotidine (PEPCID) tablet     Frequency: NIGHTLY     Dose: 40 mg     Route: Oral    folic acid (FOLVITE) tablet     Frequency: Daily     Dose: 1 mg     Route: Oral    guaiFENesin '100mg'$  per 20m oral liquid - for cough (expectorant)     Frequency: Q4H PRN     Dose: 200 mg     Route: Oral    levoFLOXacin (LEVAQUIN) 500 mg in D5W 100 mL premix IVPB     Frequency: Q24H     Dose: 500 mg     Route: Intravenous    LORazepam (ATIVAN) 2 mg/mL injection     Frequency: Q4H PRN     Dose: 0.5 mg     Route: Intravenous    methocarbamol (ROBAXIN) tablet     Frequency: 3x/day     Dose: 750 mg     Route: Oral    methylPREDNISolone sod succ (SOLU-MEDROL) 40 mg/mL injection     Frequency: Q8H     Dose: 20 mg     Route: Intravenous    metroNIDAZOLE (FLAGYL) 500 mg in NS 100 mL premix IVPB     Frequency: Q12H     Dose: 500 mg     Route: Intravenous    morphine 4 mg/mL injection     Frequency: Q4H PRN     Dose: 4 mg     Route: Intravenous    nicotine (NICODERM CQ) transdermal patch (mg/24 hr)     Frequency: Daily     Dose: 21 mg     Route: Transdermal    ondansetron (ZOFRAN) 2 mg/mL injection     Frequency: Q6H PRN     Dose: 4 mg     Route: Intravenous    oxyCODONE-acetaminophen (PERCOCET) 5-'325mg'$  per tablet     Frequency: Q4H PRN     Dose: 1 Tablet     Route: Oral    pantoprazole (PROTONIX) delayed release tablet     Frequency: 2x/day AC     Dose: 40 mg     Route: Oral    prochlorperazine (COMPAZINE) 5 mg/mL injection     Frequency: Q6H PRN     Dose: 10 mg     Route: Intravenous    ramelteon (ROZEREM) tablet     Frequency: HS PRN     Dose: 8 mg     Route: Oral        Review of Systems:  Focused review of system was  completed. Refer to the HPI for ROS details.     Today's Physical Exam:  Physical Exam  Constitutional:       General: He is not in acute distress.     Appearance: He is obese. He is not ill-appearing.   HENT:  Head: Normocephalic.   Cardiovascular:      Rate and Rhythm: Normal rate and regular rhythm.   Pulmonary:      Effort: Pulmonary effort is normal. No respiratory distress.      Breath sounds: Normal breath sounds.   Abdominal:      General: Bowel sounds are normal. There is no distension.      Palpations: There is no mass.      Tenderness: There is no abdominal tenderness. There is no guarding.   Musculoskeletal:         General: No tenderness.      Right lower leg: Edema present.      Left lower leg: Edema present.   Skin:     General: Skin is warm and dry.   Neurological:      General: No focal deficit present.      Mental Status: He is alert and oriented to person, place, and time.              I/O:  I/O last 24 hours:      Intake/Output Summary (Last 24 hours) at 09/04/2021 1242  Last data filed at 09/04/2021 0600  Gross per 24 hour   Intake 1120 ml   Output --   Net 1120 ml     I/O current shift:  No intake/output data recorded.      Labs  Please indicate ordered or reviewed)  Reviewed: I have reviewed all lab results.  Lab Results Today:    Results for orders placed or performed during the hospital encounter of 08/30/21 (from the past 24 hour(s))   C-REACTIVE PROTEIN (CRP)   Result Value Ref Range    C-REACTIVE PROTEIN (CRP) 0.8 (H) 0.1 - 0.5 mg/dL   CBC   Result Value Ref Range    WBCS UNCORRECTED 13.7 x10^3/uL    WBC 13.7 (H) 4.0 - 10.5 x103/uL    RBC 3.50 (L) 4.20 - 6.00 x106/uL    HGB 12.0 (L) 13.5 - 18.0 g/dL    HCT 35.5 (L) 42.0 - 51.0 %    MCV 101.4 (H) 78.0 - 99.0 fL    MCH 34.3 (H) 27.0 - 32.0 pg    MCHC 33.9 32.0 - 36.0 g/dL    RDW 17.4 (H) 11.6 - 14.8 %    PLATELETS 487 (H) 140 - 440 x103/uL    MPV 6.9 (L) 7.4 - 10.4 fL   COMPREHENSIVE METABOLIC PANEL, NON-FASTING   Result Value Ref  Range    SODIUM 136 136 - 145 mmol/L    POTASSIUM 3.7 3.5 - 5.1 mmol/L    CHLORIDE 101 98 - 107 mmol/L    CO2 TOTAL 26 21 - 31 mmol/L    ANION GAP 9 (L) 10 - 20 mmol/L    BUN 7 7 - 25 mg/dL    CREATININE 0.55 (L) 0.60 - 1.30 mg/dL    BUN/CREA RATIO 13 6 - 22    ESTIMATED GFR 126 >59 mL/min/1.51m    ALBUMIN 3.7 3.5 - 5.7 g/dL    CALCIUM 8.6 8.6 - 10.3 mg/dL    GLUCOSE 153 (H) 74 - 109 mg/dL    ALKALINE PHOSPHATASE 89 34 - 104 U/L    ALT (SGPT) 64 (H) 7 - 52 U/L    AST (SGOT) 130 (H) 13 - 39 U/L    BILIRUBIN TOTAL 0.7 0.3 - 1.2 mg/dL    PROTEIN TOTAL 6.7 6.4 - 8.9 g/dL    ALBUMIN/GLOBULIN RATIO 1.2 0.8 -  1.4    OSMOLALITY, CALCULATED 273 270 - 290 mOsm/kg    CALCIUM, CORRECTED 8.9 8.9 - 10.8 mg/dL    GLOBULIN 3.0 2.9 - 5.4   MAGNESIUM   Result Value Ref Range    MAGNESIUM 2.3 1.9 - 2.7 mg/dL           Problem List:  Active Hospital Problems   (*Primary Problem)    Diagnosis    *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 Pt on iv abx and steroids for colitis flare and should consider colonoscopy as directed by gi. UC tx deferred to GI team. Pt has hepatic steatosis on liver u/s from 08/26/20 which can likely be followed outpt. Will order echo to evaluate leg edema, however, pt is not requiring O2 or having cp. Depending on echo results, pt possibly could be switched to po meds and DC home tomorrow with outpt f/u with gi team and PCP for further eval and tx. Further recommendations depending on clinical course/test results.The Hospitalist personally evaluated and examined the patient in conjunction with the MLP and agree with the assessments, treatment plan and disposition of the patient as recorded by the [facility].   Lovenia Kim, PA-C    DVT/PE Prophylaxis: Not indicated, low risk for  VTE    I evaluated the patient independent of the APP and plan was discussed and agreed upon.     Celene Kras, DO

## 2021-09-04 NOTE — Consults (Signed)
[facility]  Gastroenterology/ Hepatology Consult Note      Patient: [name], [name], 46 y.o. male  Date of Birth: 11-Oct-1975  Admission Date: 08/30/2021  PCP: Peri Maris, DO    History of Present Illness:  Sitting up on side of bed eating regular diet. Tolerating with no N/V. He has some abdominal cramping and had diarrhea yesterday evening since he did not get Cholestryamine. Denies blood in stools or melena. Complains of swelling in legs. Medical is addressing. EGD was done yesterday but report not in chart. Per pt he was told he had Barrett's esophagus and erosive esophagitis.     Review of Systems   Constitutional: Negative for fever.   Gastrointestinal: Positive for bloating and diarrhea. Negative for abdominal pain, constipation, dysphagia, heartburn, hematochezia, melena, nausea and vomiting.     Vitals:  Temperature: 36.9 C (98.5 F)  Heart Rate: 79  Respiratory Rate: 19  BP (Non-Invasive): 115/71  SpO2: 97 %    Objective:  General Appearance:  Comfortable, well-appearing and in no acute distress.    Vital signs: (most recent): Blood pressure 115/71, pulse 79, temperature 36.9 C (98.5 F), resp. rate 19, height 1.676 m ('5\' 6"'$ ), weight 83.6 kg (184 lb 4.8 oz), SpO2 97 %.  Vital signs are normal.  No fever.    Lungs:  Normal effort and normal respiratory rate.  Breath sounds clear to auscultation.    Heart: Normal rate.  Regular rhythm.  S1 normal and S2 normal.    Abdomen: Abdomen is soft.  Bowel sounds are normal.   There is generalized tenderness.     Extremities: Normal range of motion.    Pulses: Distal pulses are intact.    Neurological: Patient is alert and oriented to person, place and time.    Skin:  Warm and dry.        Active Hospital Problems   (*Primary Problem)    Diagnosis   . 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 Tobacco use disorder   . Continuous tobacco abuse   . HTN (hypertension)   . Lactic acidosis   . COPD (chronic obstructive pulmonary disease)  (CMS HCC)   . Hypokalemia   . Leucocytosis       Labs:     Results for orders placed or performed during the hospital encounter of 08/30/21 (from the past 24 hour(s))   CBC   Result Value Ref Range    WBCS UNCORRECTED 13.7 x10^3/uL    WBC 13.7 (H) 4.0 - 10.5 x10^3/uL    RBC 3.50 (L) 4.20 - 6.00 x10^6/uL    HGB 12.0 (L) 13.5 - 18.0 g/dL    HCT 35.5 (L) 42.0 - 51.0 %    MCV 101.4 (H) 78.0 - 99.0 fL    MCH 34.3 (H) 27.0 - 32.0 pg    MCHC 33.9 32.0 - 36.0 g/dL    RDW 17.4 (H) 11.6 - 14.8 %    PLATELETS 487 (H) 140 - 440 x10^3/uL    MPV 6.9 (L) 7.4 - 10.4 fL   COMPREHENSIVE METABOLIC PANEL, NON-FASTING   Result Value Ref Range    SODIUM 136 136 - 145 mmol/L    POTASSIUM 3.7 3.5 - 5.1 mmol/L    CHLORIDE 101 98 - 107 mmol/L    CO2 TOTAL 26 21 - 31 mmol/L    ANION GAP 9 (L) 10 - 20 mmol/L    BUN 7 7 - 25 mg/dL    CREATININE 0.55 (L)  0.60 - 1.30 mg/dL    BUN/CREA RATIO 13 6 - 22    ESTIMATED GFR 126 >59 mL/min/1.71m2    ALBUMIN 3.7 3.5 - 5.7 g/dL    CALCIUM 8.6 8.6 - 10.3 mg/dL    GLUCOSE 153 (H) 74 - 109 mg/dL    ALKALINE PHOSPHATASE 89 34 - 104 U/L    ALT (SGPT) 64 (H) 7 - 52 U/L    AST (SGOT) 130 (H) 13 - 39 U/L    BILIRUBIN TOTAL 0.7 0.3 - 1.2 mg/dL    PROTEIN TOTAL 6.7 6.4 - 8.9 g/dL    ALBUMIN/GLOBULIN RATIO 1.2 0.8 - 1.4    OSMOLALITY, CALCULATED 273 270 - 290 mOsm/kg    CALCIUM, CORRECTED 8.9 8.9 - 10.8 mg/dL    GLOBULIN 3.0 2.9 - 5.4   MAGNESIUM   Result Value Ref Range    MAGNESIUM 2.3 1.9 - 2.7 mg/dL       Imaging Studies:    Results for orders placed or performed during the hospital encounter of 08/30/21   XR CHEST PA AND LATERAL     Status: None    Narrative    SR. Jahel ANDREW Regino    RADIOLOGIST: Basim Antoun    XR CHEST PA AND LATERAL performed on 08/30/2021 1:12 PM    CLINICAL HISTORY: COPD.  copd.    TECHNIQUE: PA and lateral views of the chest were obtained.    COMPARISON: 05/24/2018.    FINDINGS: The lungs are mildly hyperexpanded but clear. The heart size mildly enlarged without change from prior  study. The pulmonary vascularity is unremarkable. The trachea is at or near the midline.      Impression    1. Mildly hyperexpanded lungs.  2. Borderline cardiomegaly.  3. No acute cardiopulmonary process, infiltrates, consolidation or effusion.  4. No significant change since prior study.      Radiologist location ID: [id number]    CT ABDOMEN PELVIS W IV CONTRAST     Status: None    Narrative    SR. Mohid ANDREW Kienle    RADIOLOGIST: BTempie Donning   CT ABDOMEN PELVIS W IV CONTRAST performed on 08/30/2021 2:15 PM    CLINICAL HISTORY: nausea, chronic diarrhea.  vomiting/diarrhea     TECHNIQUE:  Abdomen and pelvis CT with intravenous contrast.  IV CONTRAST: 100 ml's of Omnipaque 350    COMPARISON:  11/26/2016  # of known CTs in the past 12 months: 0   # of known Cardiac Nuclear Medicine Studies in the past 12 months: 0    FINDINGS:  Lung bases: Clear    Liver:   The liver is enlarged. There is diffuse hepatic steatosis with lower density areas seen throughout the periphery.    Gallbladder:   Unremarkable.    Spleen:   Unremarkable.    Pancreas:   Unremarkable.    Adrenals:   Unremarkable.    Kidneys:   Stable cyst in the right kidney. No hydronephrosis.    Bladder:  Unremarkable.  Prostate:  Unremarkable.    Bowel:   There is wall thickening involving the sigmoid colon. There is some submucosal fatty infiltration throughout portions of the descending colon. There is no large or small bowel dilatation.    Appendix:  Normal.    Lymph nodes:  No suspicious lymph node enlargement.    Vasculature:   Mild diffuse atherosclerotic calcifications are noted.     Peritoneum / Retroperitoneum: No ascites.  No free air.    Bones:  Unremarkable.          Impression    Diffuse hepatic steatosis with lower density areas seen throughout the periphery of the liver possibly indicating heterogenous fatty deposition. This could be further evaluated with a follow-up nonemergent MRI of the liver with and without contrast.    There  is wall thickening involving the sigmoid colon which could represent colitis in the correct clinical setting. Please correlate with patient's clinical signs and symptoms. Follow-up colonoscopy is recommended if one has not been recently performed.      One or more dose reduction techniques were used (e.g., Automated exposure control, adjustment of the mA and/or kV according to patient size, use of iterative reconstruction technique).      Radiologist location ID: [id number]             Assessment/Plan:  Patient Active Problem List   Diagnosis   . Leucocytosis   . Anemia   . Hypokalemia   . 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 Continuous tobacco abuse   . HTN (hypertension)   . Lactic acidosis   . COPD (chronic obstructive pulmonary disease) (CMS HCC)   . Tobacco use disorder     Continue current management and supportive care. EGD was done yesterday. Report not yet available. Continue Protonix and Famotidine. Cholestryamine has been restarted now that he is not NPO. Tolerating diet. Hgb stable. Will continue to follow. Further recommendations based on clinical course.   Patient discussed in detail with Dr Keith Rake and treatment plan decided by him.   //kweatherly aprn-bc, fnp    Historical Data   Past Medical History:   Diagnosis Date   . Asthma    . Bleeding hemorrhoid    . Bleeding ulcer    . Cervical herniated disc    . COPD (chronic obstructive pulmonary disease) (CMS HCC)    . GERD (gastroesophageal reflux disease)    . Hx MRSA infection    . Hyperlipidemia    . Hypertension    . Rotator cuff arthropathy, right    . Ulcerative colitis (CMS [place])      Past Surgical History:   Procedure Laterality Date   . HX CYST REMOVAL      lower back   . HX HERNIA REPAIR     . HX TONSILLECTOMY       Medications Prior to Admission     Prescriptions    albuterol sulfate (PROVENTIL OR VENTOLIN OR PROAIR) 90 mcg/actuation Inhalation oral inhaler    Take 1-2 Puffs by inhalation Every 6 hours as needed for  Other (shortness of breath)    azithromycin (ZITHROMAX) 250 mg Oral Tablet    Take 1 Tablet (250 mg total) by mouth Once a day 2-5 day treatment    gabapentin (NEURONTIN) 100 mg Oral Capsule    Take 1 Capsule (100 mg total) by mouth Patient states gives him nightmare. Only takes 100 mg in evenings and 300 mg at night.    Ibuprofen (MOTRIN) 800 mg Oral Tablet    Take 1 Tablet (800 mg total) by mouth Three times a day as needed for Pain    methocarbamoL (ROBAXIN) 750 mg Oral Tablet    Take 1 Tablet (750 mg total) by mouth Three times a day    omeprazole (PRILOSEC) 40 mg Oral Capsule, Delayed Release(E.C.)    Take 1 Capsule (40 mg total) by mouth Once a day    promethazine (PHENERGAN) 6.25 mg/5 mL Oral Syrup  Take 5 mL (6.25 mg total) by mouth Every 6 hours as needed for Nausea/Vomiting        Allergies   Allergen Reactions   . Noctec [Chloral Hydrate] Anaphylaxis   . Penicillins Anaphylaxis   . Theophylline Shortness of Breath   . Flexeril [Cyclobenzaprine] Mental Status Effect     Homicidal ideation   . Prednisone Myalgia     Family History   Problem Relation Age of Onset   . Hypertension (High Blood Pressure) Mother    . High Cholesterol Mother    . Diabetes Mother    . Cancer Mother    . Asthma Mother    . Arthritis-osteo Mother    . Stroke Father    . Hypertension (High Blood Pressure) Father    . High Cholesterol Father    . Heart Attack Father    . Diabetes Father    . Coronary Artery Disease Father    . Congestive Heart Failure Father    . Cancer Father    . Blood Clots Father    . Asthma Father    . Arthritis-osteo Father    . Anesthesia Complications Father    . Asthma Brother    . Stroke Maternal Grandmother    . Stroke Maternal Grandfather    . Stroke Paternal Grandmother    . Heart Attack Paternal Grandmother    . Coronary Artery Disease Paternal Grandmother    . Congestive Heart Failure Paternal Grandmother    . Blood Clots Paternal Grandmother    . Asthma Paternal Grandmother    . Stroke Paternal  Grandfather    . Heart Attack Paternal Grandfather    . Coronary Artery Disease Paternal Grandfather    . Congestive Heart Failure Paternal Grandfather    . Blood Clots Paternal Grandfather    . Asthma Paternal Grandfather      Social History     Socioeconomic History   . Marital status: Married   Tobacco Use   . Smoking status: Every Day     Packs/day: 1.00     Years: 30.00     Pack years: 30.00     Types: Cigarettes   . Smokeless tobacco: Never   Vaping Use   . Vaping Use: Never used   Substance and Sexual Activity   . Alcohol use: Yes     Comment: occasional   . Drug use: Never          Delfin Edis, CFNP

## 2021-09-05 ENCOUNTER — Inpatient Hospital Stay (HOSPITAL_COMMUNITY): Payer: Medicaid Other

## 2021-09-05 ENCOUNTER — Other Ambulatory Visit: Payer: Self-pay

## 2021-09-05 DIAGNOSIS — K529 Noninfective gastroenteritis and colitis, unspecified: Secondary | ICD-10-CM

## 2021-09-05 LAB — COMPREHENSIVE METABOLIC PANEL, NON-FASTING
ALBUMIN/GLOBULIN RATIO: 1.2 (ref 0.8–1.4)
ALBUMIN/GLOBULIN RATIO: 1.3 (ref 0.8–1.4)
ALBUMIN: 3.7 g/dL (ref 3.5–5.7)
ALBUMIN: 3.9 g/dL (ref 3.5–5.7)
ALKALINE PHOSPHATASE: 93 U/L (ref 34–104)
ALKALINE PHOSPHATASE: 95 U/L (ref 34–104)
ALT (SGPT): 73 U/L — ABNORMAL HIGH (ref 7–52)
ALT (SGPT): 80 U/L — ABNORMAL HIGH (ref 7–52)
ANION GAP: 9 mmol/L — ABNORMAL LOW (ref 10–20)
ANION GAP: 9 mmol/L — ABNORMAL LOW (ref 10–20)
AST (SGOT): 119 U/L — ABNORMAL HIGH (ref 13–39)
AST (SGOT): 128 U/L — ABNORMAL HIGH (ref 13–39)
BILIRUBIN TOTAL: 0.6 mg/dL (ref 0.3–1.2)
BILIRUBIN TOTAL: 0.7 mg/dL (ref 0.3–1.2)
BUN/CREA RATIO: 13 (ref 6–22)
BUN/CREA RATIO: 12 (ref 6–22)
BUN: 7 mg/dL (ref 7–25)
BUN: 7 mg/dL (ref 7–25)
CALCIUM, CORRECTED: 8.9 mg/dL (ref 8.9–10.8)
CALCIUM, CORRECTED: 8.8 mg/dL — ABNORMAL LOW (ref 8.9–10.8)
CALCIUM: 8.6 mg/dL (ref 8.6–10.3)
CALCIUM: 8.7 mg/dL (ref 8.6–10.3)
CHLORIDE: 101 mmol/L (ref 98–107)
CHLORIDE: 99 mmol/L (ref 98–107)
CO2 TOTAL: 25 mmol/L (ref 21–31)
CO2 TOTAL: 25 mmol/L (ref 21–31)
CREATININE: 0.54 mg/dL — ABNORMAL LOW (ref 0.60–1.30)
CREATININE: 0.58 mg/dL — ABNORMAL LOW (ref 0.60–1.30)
ESTIMATED GFR: 127 mL/min/{1.73_m2} (ref >59)
ESTIMATED GFR: 124 mL/min/{1.73_m2} (ref >59)
GLOBULIN: 3 (ref 2.9–5.4)
GLOBULIN: 3.1 (ref 2.9–5.4)
GLUCOSE: 154 mg/dL — ABNORMAL HIGH (ref 74–109)
GLUCOSE: 208 mg/dL — ABNORMAL HIGH (ref 74–109)
OSMOLALITY, CALCULATED: 271 mOsm/kg (ref 270–290)
OSMOLALITY, CALCULATED: 271 mOsm/kg (ref 270–290)
POTASSIUM: 3.9 mmol/L (ref 3.5–5.1)
POTASSIUM: 4.1 mmol/L (ref 3.5–5.1)
PROTEIN TOTAL: 6.7 g/dL (ref 6.4–8.9)
PROTEIN TOTAL: 7 g/dL (ref 6.4–8.9)
SODIUM: 135 mmol/L — ABNORMAL LOW (ref 136–145)
SODIUM: 133 mmol/L — ABNORMAL LOW (ref 136–145)

## 2021-09-05 LAB — CBC
HCT: 35.1 % — ABNORMAL LOW (ref 42.0–51.0)
HGB: 11.8 g/dL — ABNORMAL LOW (ref 13.5–18.0)
MCH: 34.2 pg — ABNORMAL HIGH (ref 27.0–32.0)
MCHC: 33.7 g/dL (ref 32.0–36.0)
MCV: 101.3 fL — ABNORMAL HIGH (ref 78.0–99.0)
MPV: 7.1 fL — ABNORMAL LOW (ref 7.4–10.4)
PLATELETS: 450 10*3/uL — ABNORMAL HIGH (ref 140–440)
RBC: 3.46 10*6/uL — ABNORMAL LOW (ref 4.20–6.00)
RDW: 17.2 % — ABNORMAL HIGH (ref 11.6–14.8)
WBC: 16.2 10*3/uL — ABNORMAL HIGH (ref 4.0–10.5)
WBCS UNCORRECTED: 16.2 10*3/uL

## 2021-09-05 LAB — CBC WITH DIFF
BASOPHIL #: 0 10*3/uL (ref 0.00–2.50)
BASOPHIL %: 0 % (ref 0–3)
EOSINOPHIL #: 0 10*3/uL (ref 0.00–2.40)
EOSINOPHIL %: 0 % (ref 0–7)
HCT: 36.6 % — ABNORMAL LOW (ref 42.0–51.0)
HGB: 12.3 g/dL — ABNORMAL LOW (ref 13.5–18.0)
LYMPHOCYTE #: 1.7 10*3/uL — ABNORMAL LOW (ref 2.10–11.00)
LYMPHOCYTE %: 10 % — ABNORMAL LOW (ref 25–45)
MCH: 34.1 pg — ABNORMAL HIGH (ref 27.0–32.0)
MCHC: 33.7 g/dL (ref 32.0–36.0)
MCV: 101.2 fL — ABNORMAL HIGH (ref 78.0–99.0)
MONOCYTE #: 1.1 10*3/uL (ref 0.00–4.10)
MONOCYTE %: 7 % (ref 0–12)
MPV: 6.9 fL — ABNORMAL LOW (ref 7.4–10.4)
NEUTROPHIL #: 13.6 10*3/uL (ref 4.10–29.00)
NEUTROPHIL %: 83 % — ABNORMAL HIGH (ref 40–76)
PLATELETS: 451 10*3/uL — ABNORMAL HIGH (ref 140–440)
RBC: 3.62 10*6/uL — ABNORMAL LOW (ref 4.20–6.00)
RDW: 17.6 % — ABNORMAL HIGH (ref 11.6–14.8)
WBC: 16.3 10*3/uL — ABNORMAL HIGH (ref 4.0–10.5)
WBCS UNCORRECTED: 16.3 10*3/uL

## 2021-09-05 LAB — MAGNESIUM: MAGNESIUM: 2.3 mg/dL (ref 1.9–2.7)

## 2021-09-05 MED ORDER — FUROSEMIDE 10 MG/ML INJECTION SOLUTION
80.0000 mg | Freq: Two times a day (BID) | INTRAMUSCULAR | Status: DC
Start: 2021-09-05 — End: 2021-09-06
  Administered 2021-09-05: 80 mg via INTRAVENOUS
  Administered 2021-09-06: 0 mg via INTRAVENOUS
  Administered 2021-09-06: 80 mg via INTRAVENOUS
  Filled 2021-09-05 (×3): qty 8

## 2021-09-05 NOTE — Progress Notes (Signed)
[facility]  IP PROGRESS NOTE      [name], [name]  Date of Admission:  08/30/2021  Date of Birth:  06/26/1975  Date of Service:  09/05/2021    Hospital Day:  LOS: 6 days       Subjective:   Pt seen and examined. No new complaints. Still has leg edema, but otherwise asymptomatic. No major overnight events per nursing.  Vital Signs:  Temp (24hrs) Max:37 C (98.6 F)      Temperature: 36.7 C (98 F)  BP (Non-Invasive): (!) 148/86  MAP (Non-Invasive): 1 mmHG  Heart Rate: 87  Respiratory Rate: 18  SpO2: 96 %    Current Medications:  acetaminophen (TYLENOL) tablet, 650 mg, Oral, Q4H PRN  albuterol (PROVENTIL) 2.5 mg / 3 mL (0.083%) neb solution, 2.5 mg, Nebulization, Q6H  albuterol (PROVENTIL) 2.5 mg / 3 mL (0.083%) neb solution, 2.5 mg, Nebulization, Q4H PRN  aluminum-magnesium hydroxide-simethicone (MAG-AL PLUS) 200-200-20 mg per 5 mL oral liquid, 10 mL, Oral, Q4H PRN  cholestyramine-sucrose (QUESTRAN) 4 gram packet, 1 Packet, Oral, Daily  famotidine (PEPCID) tablet, 40 mg, Oral, NIGHTLY  folic acid (FOLVITE) tablet, 1 mg, Oral, Daily  furosemide (LASIX) 10 mg/mL injection, 80 mg, Intravenous, 2x/day  guaiFENesin '100mg'$  per 70m oral liquid - for cough (expectorant), 200 mg, Oral, Q4H PRN  methocarbamol (ROBAXIN) tablet, 750 mg, Oral, 3x/day  methylPREDNISolone sod succ (SOLU-MEDROL) 40 mg/mL injection, 20 mg, Intravenous, Q8H  nicotine (NICODERM CQ) transdermal patch (mg/24 hr), 21 mg, Transdermal, Daily  oxyCODONE-acetaminophen (PERCOCET) 5-'325mg'$  per tablet, 1 Tablet, Oral, Q4H PRN  pantoprazole (PROTONIX) delayed release tablet, 40 mg, Oral, 2x/day AC  ramelteon (ROZEREM) tablet, 8 mg, Oral, HS PRN        Current Orders:  Active Orders   Lab    CBC     Frequency: ONE TIME     Number of Occurrences: 1 Occurrences    CBC     Frequency: ONE TIME     Number of Occurrences: 1 Occurrences    CBC     Frequency: ONE TIME     Number of Occurrences: 1 Occurrences    COMPREHENSIVE METABOLIC PANEL,  NON-FASTING     Frequency: ONE TIME     Number of Occurrences: 1 Occurrences    COMPREHENSIVE METABOLIC PANEL, NON-FASTING     Frequency: ONE TIME     Number of Occurrences: 1 Occurrences    COMPREHENSIVE METABOLIC PANEL, NON-FASTING     Frequency: ONE TIME     Number of Occurrences: 1 Occurrences    MAGNESIUM     Frequency: ONE TIME     Number of Occurrences: 1 Occurrences    MAGNESIUM     Frequency: ONE TIME     Number of Occurrences: 1 Occurrences    MAGNESIUM     Frequency: ONE TIME     Number of Occurrences: 1 Occurrences   Diet    DIET GI SOFT/BLAND Do you want to initiate MNT Protocol? Yes; Additional modifications/limitations: LOW RESIDUE     Frequency: All Meals     Number of Occurrences: 1 Occurrences   Nursing    ACTIVITY Activity: OOB WITH ASSISTANCE     Frequency: UNTIL DISCONTINUED     Number of Occurrences: Until Specified    ENCOURAGE AMBULATION AND ROM EXERCISES     Frequency: UNTIL DISCONTINUED     Number of Occurrences: Until Specified    Notify MD Vital Signs     Frequency: PRN  Number of Occurrences: Until Specified    PT IS LOW RISK FOR VENOUS THROMBOEMBOLISM     Frequency: CONTINUOUS     Number of Occurrences: Until Specified    PULSE OXIMETRY Q4H     Frequency: Q4H     Number of Occurrences: Until Specified    TELEMETRY MONITORING - Continuous     Frequency: CONTINUOUS     Number of Occurrences: Until Specified    VITAL SIGNS QSHIFT     Frequency: QSHIFT     Number of Occurrences: Until Specified   Code Status    FULL CODE     Frequency: CONTINUOUS     Number of Occurrences: Until Specified   Consult    IP CONSULT TO GASTROENTEROLOGY Requested Provider; Netty Starring     Frequency: ONE TIME     Number of Occurrences: 1 Occurrences     Order Comments: Nurse may enter secondary order if modifications/details needed.       IP CONSULT TO GENERAL SURGERY Requested Provider; HOPKINS, ERIC S; 08/31/2021; 12:04 PM     Frequency: ONE TIME     Number of Occurrences: 1 Occurrences     Order  Comments: Nurse may enter secondary order if modifications/details needed.      Respiratory Care    OXYGEN - NASAL CANNULA     Frequency: CONTINUOUS     Number of Occurrences: Until Specified     Order Comments: Flowrate should not exeed 6L/min except [facility] facility.          Cardiac Services    ECG 12 LEAD     Frequency: ONE TIME     Number of Occurrences: 1 Occurrences     Scheduling Instructions:               Case Request    CASE REQUEST SURGICAL: GASTROSCOPY WITH BIOPSY     Frequency: ONE TIME     Number of Occurrences: 1 Occurrences   Medications    acetaminophen (TYLENOL) tablet     Frequency: Q4H PRN     Dose: 650 mg     Route: Oral    albuterol (PROVENTIL) 2.5 mg / 3 mL (0.083%) neb solution     Frequency: Q6H     Dose: 2.5 mg     Route: Nebulization    albuterol (PROVENTIL) 2.5 mg / 3 mL (0.083%) neb solution     Frequency: Q4H PRN     Dose: 2.5 mg     Route: Nebulization    aluminum-magnesium hydroxide-simethicone (MAG-AL PLUS) 200-200-20 mg per 5 mL oral liquid     Frequency: Q4H PRN     Dose: 10 mL     Route: Oral    cholestyramine-sucrose (QUESTRAN) 4 gram packet     Frequency: Daily     Dose: 1 Packet     Route: Oral    famotidine (PEPCID) tablet     Frequency: NIGHTLY     Dose: 40 mg     Route: Oral    folic acid (FOLVITE) tablet     Frequency: Daily     Dose: 1 mg     Route: Oral    furosemide (LASIX) 10 mg/mL injection     Frequency: 2x/day     Dose: 80 mg     Route: Intravenous    guaiFENesin '100mg'$  per 61m oral liquid - for cough (expectorant)     Frequency: Q4H PRN     Dose: 200 mg     Route:  Oral    methocarbamol (ROBAXIN) tablet     Frequency: 3x/day     Dose: 750 mg     Route: Oral    methylPREDNISolone sod succ (SOLU-MEDROL) 40 mg/mL injection     Frequency: Q8H     Dose: 20 mg     Route: Intravenous    nicotine (NICODERM CQ) transdermal patch (mg/24 hr)     Frequency: Daily     Dose: 21 mg     Route: Transdermal    oxyCODONE-acetaminophen (PERCOCET) 5-'325mg'$  per tablet     Frequency: Q4H PRN      Dose: 1 Tablet     Route: Oral    pantoprazole (PROTONIX) delayed release tablet     Frequency: 2x/day AC     Dose: 40 mg     Route: Oral    ramelteon (ROZEREM) tablet     Frequency: HS PRN     Dose: 8 mg     Route: Oral        Review of Systems:  Focused review of system was completed. Refer to the HPI for ROS details.     Today's Physical Exam:  Physical Exam  Constitutional:       General: He is not in acute distress.     Appearance: He is obese.   Cardiovascular:      Rate and Rhythm: Normal rate and regular rhythm.   Pulmonary:      Effort: Pulmonary effort is normal. No respiratory distress.      Breath sounds: No wheezing or rhonchi.   Abdominal:      General: Bowel sounds are normal.      Palpations: Abdomen is soft.      Tenderness: There is no abdominal tenderness. There is no guarding.   Musculoskeletal:         General: Swelling present.   Skin:     General: Skin is warm and dry.   Neurological:      General: No focal deficit present.      Mental Status: He is alert and oriented to person, place, and time.              I/O:  I/O last 24 hours:      Intake/Output Summary (Last 24 hours) at 09/05/2021 1350  Last data filed at 09/05/2021 1000  Gross per 24 hour   Intake 1700 ml   Output 5 ml   Net 1695 ml     I/O current shift:  03/19 0700 - 03/19 1859  In: 100   Out: -       Labs  Please indicate ordered or reviewed)  Reviewed: I have reviewed all lab results.  Lab Results Today:    Results for orders placed or performed during the hospital encounter of 08/30/21 (from the past 24 hour(s))   CBC   Result Value Ref Range    WBCS UNCORRECTED 16.2 x10^3/uL    WBC 16.2 (H) 4.0 - 10.5 x103/uL    RBC 3.46 (L) 4.20 - 6.00 x106/uL    HGB 11.8 (L) 13.5 - 18.0 g/dL    HCT 35.1 (L) 42.0 - 51.0 %    MCV 101.3 (H) 78.0 - 99.0 fL    MCH 34.2 (H) 27.0 - 32.0 pg    MCHC 33.7 32.0 - 36.0 g/dL    RDW 17.2 (H) 11.6 - 14.8 %    PLATELETS 450 (H) 140 - 440 x103/uL    MPV 7.1 (L) 7.4 - 10.4 fL  MAGNESIUM   Result Value Ref  Range    MAGNESIUM 2.3 1.9 - 2.7 mg/dL   COMPREHENSIVE METABOLIC PANEL, NON-FASTING   Result Value Ref Range    SODIUM 135 (L) 136 - 145 mmol/L    POTASSIUM 3.9 3.5 - 5.1 mmol/L    CHLORIDE 101 98 - 107 mmol/L    CO2 TOTAL 25 21 - 31 mmol/L    ANION GAP 9 (L) 10 - 20 mmol/L    BUN 7 7 - 25 mg/dL    CREATININE 0.54 (L) 0.60 - 1.30 mg/dL    BUN/CREA RATIO 13 6 - 22    ESTIMATED GFR 127 >59 mL/min/1.85m    ALBUMIN 3.7 3.5 - 5.7 g/dL    CALCIUM 8.6 8.6 - 10.3 mg/dL    GLUCOSE 154 (H) 74 - 109 mg/dL    ALKALINE PHOSPHATASE 93 34 - 104 U/L    ALT (SGPT) 73 (H) 7 - 52 U/L    AST (SGOT) 119 (H) 13 - 39 U/L    BILIRUBIN TOTAL 0.6 0.3 - 1.2 mg/dL    PROTEIN TOTAL 6.7 6.4 - 8.9 g/dL    ALBUMIN/GLOBULIN RATIO 1.2 0.8 - 1.4    OSMOLALITY, CALCULATED 271 270 - 290 mOsm/kg    CALCIUM, CORRECTED 8.9 8.9 - 10.8 mg/dL    GLOBULIN 3.0 2.9 - 5.4   COMPREHENSIVE METABOLIC PANEL, NON-FASTING   Result Value Ref Range    SODIUM 133 (L) 136 - 145 mmol/L    POTASSIUM 4.1 3.5 - 5.1 mmol/L    CHLORIDE 99 98 - 107 mmol/L    CO2 TOTAL 25 21 - 31 mmol/L    ANION GAP 9 (L) 10 - 20 mmol/L    BUN 7 7 - 25 mg/dL    CREATININE 0.58 (L) 0.60 - 1.30 mg/dL    BUN/CREA RATIO 12 6 - 22    ESTIMATED GFR 124 >59 mL/min/1.775m   ALBUMIN 3.9 3.5 - 5.7 g/dL    CALCIUM 8.7 8.6 - 10.3 mg/dL    GLUCOSE 208 (H) 74 - 109 mg/dL    ALKALINE PHOSPHATASE 95 34 - 104 U/L    ALT (SGPT) 80 (H) 7 - 52 U/L    AST (SGOT) 128 (H) 13 - 39 U/L    BILIRUBIN TOTAL 0.7 0.3 - 1.2 mg/dL    PROTEIN TOTAL 7.0 6.4 - 8.9 g/dL    ALBUMIN/GLOBULIN RATIO 1.3 0.8 - 1.4    OSMOLALITY, CALCULATED 271 270 - 290 mOsm/kg    CALCIUM, CORRECTED 8.8 (L) 8.9 - 10.8 mg/dL    GLOBULIN 3.1 2.9 - 5.4   CBC WITH DIFF   Result Value Ref Range    WBCS UNCORRECTED 16.3 x10^3/uL    WBC 16.3 (H) 4.0 - 10.5 x103/uL    RBC 3.62 (L) 4.20 - 6.00 x106/uL    HGB 12.3 (L) 13.5 - 18.0 g/dL    HCT 36.6 (L) 42.0 - 51.0 %    MCV 101.2 (H) 78.0 - 99.0 fL    MCH 34.1 (H) 27.0 - 32.0 pg    MCHC 33.7 32.0 - 36.0 g/dL     RDW 17.6 (H) 11.6 - 14.8 %    PLATELETS 451 (H) 140 - 440 x103/uL    MPV 6.9 (L) 7.4 - 10.4 fL    NEUTROPHIL % 83 (H) 40 - 76 %    LYMPHOCYTE % 10 (L) 25 - 45 %    MONOCYTE % 7 0 - 12 %    EOSINOPHIL %  0 0 - 7 %    BASOPHIL % 0 0 - 3 %    NEUTROPHIL # 13.60 4.10 - 29.00 x103/uL    LYMPHOCYTE # 1.70 (L) 2.10 - 11.00 x103/uL    MONOCYTE # 1.10 0.00 - 4.10 x103/uL    EOSINOPHIL # 0.00 0.00 - 2.40 x103/uL    BASOPHIL # 0.00 0.00 - 2.50 x103/uL           Problem List:  Active Hospital Problems   (*Primary Problem)    Diagnosis    *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 Iv lasix was added for edema and he will need po lasix at DC due to cirrhosis. Gi team plans to see him as outpt and add aldactone and do formal cirrhosis workup. Antibiotics have been DC as his colitis was not thought to be infectious but rather due to uc. Continue steroids and will need po at DC. Repeat labs in am to monitor electrolytes after iv lasix. Repeat EKG to reassess qt interval.qt interval prolonging agents have been held.  Further recommendations depending on clinical course/test results. The Hospitalist personally evaluated and examined the patient in conjunction with the MLP and agree with the assessments, treatment plan and disposition of the patient as recorded by the [facility].    Lovenia Kim, PA-C    DVT/PE Prophylaxis: Not indicated, low risk for VTE    I evaluated the patient independent of the APP and plan was discussed and agreed upon.     Celene Kras, DO

## 2021-09-05 NOTE — Nurses Notes (Signed)
VOIDED  AT  LEAST  5  TIMES  AFTER  RECEIVING  FIRST  DOSE  OF  IV  LASIX THIS  SHIFT.  BP  THIS  EVENING WAS  NORMAL  SO  SCHEDULED  IV  LASIX 80 mg  WAS  AGAIN  GIVEN.

## 2021-09-05 NOTE — Nurses Notes (Signed)
MADE  AWARE  OF  DR.  STARTING  HIM  ON  IV  LASIX,   80 mg  DOSE  GIVEN,  MADE  PT  AWARE  THAT  He  WILL  HAVE  TO VOID  A LOT  SINCE  MED  IS  A  DIURETIC.

## 2021-09-05 NOTE — Consults (Signed)
[facility]  Gastroenterology/ Hepatology Consult Note      Patient: [name], [name], 46 y.o. male  Date of Birth: 05-25-1976  Admission Date: 08/30/2021  PCP: Peri Maris, DO    History of Present Illness:  He states he always has abdominal pain and bloating but states the pain is not as bad. He denies nausea or vomiting and is tolerating diet. He denies heartburn or acid reflux. States he had one diarrhea stool yesterday and then a formed one yesterday evening. Denies melena but states occasionally his hemorrhoids bleed. He has some edema lower legs.     Review of Systems   Constitutional: Negative for fever.   Gastrointestinal: Positive for bloating, abdominal pain and diarrhea. Negative for constipation, dysphagia, heartburn, melena, nausea and vomiting.     Vitals:  Temperature: 37 C (98.6 F)  Heart Rate: 79  Respiratory Rate: 18  BP (Non-Invasive): 112/71  SpO2: 96 %    Objective:  General Appearance:  Comfortable, well-appearing and in no acute distress.    Vital signs: (most recent): Blood pressure 112/71, pulse 79, temperature 37 C (98.6 F), resp. rate 18, height 1.676 m ('5\' 6"'$ ), weight 83.6 kg (184 lb 4.8 oz), SpO2 96 %.  Vital signs are normal.  No fever.    Lungs:  Normal effort and normal respiratory rate.  Breath sounds clear to auscultation.    Heart: Normal rate.  Regular rhythm.  S1 normal and S2 normal.    Abdomen: Abdomen is soft.  Bowel sounds are normal.   There is generalized tenderness.  There is no rebound tenderness.  There is no guarding.     Extremities: Normal range of motion.  There is dependent edema.  (1+ lower legs)  Pulses: Distal pulses are intact.    Neurological: Patient is alert and oriented to person, place and time.    Skin:  Warm and dry.        Active Hospital Problems   (*Primary Problem)    Diagnosis   . 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 Tobacco use disorder   . Continuous tobacco abuse   . HTN (hypertension)   . Lactic  acidosis   . COPD (chronic obstructive pulmonary disease) (CMS HCC)   . Hypokalemia   . Leucocytosis       Labs:     Results for orders placed or performed during the hospital encounter of 08/30/21 (from the past 24 hour(s))   CBC   Result Value Ref Range    WBCS UNCORRECTED 16.2 x10^3/uL    WBC 16.2 (H) 4.0 - 10.5 x10^3/uL    RBC 3.46 (L) 4.20 - 6.00 x10^6/uL    HGB 11.8 (L) 13.5 - 18.0 g/dL    HCT 35.1 (L) 42.0 - 51.0 %    MCV 101.3 (H) 78.0 - 99.0 fL    MCH 34.2 (H) 27.0 - 32.0 pg    MCHC 33.7 32.0 - 36.0 g/dL    RDW 17.2 (H) 11.6 - 14.8 %    PLATELETS 450 (H) 140 - 440 x10^3/uL    MPV 7.1 (L) 7.4 - 10.4 fL   CBC WITH DIFF   Result Value Ref Range    WBCS UNCORRECTED 16.3 x10^3/uL    WBC 16.3 (H) 4.0 - 10.5 x10^3/uL    RBC 3.62 (L) 4.20 - 6.00 x10^6/uL    HGB 12.3 (L) 13.5 - 18.0 g/dL    HCT 36.6 (L) 42.0 - 51.0 %    MCV  101.2 (H) 78.0 - 99.0 fL    MCH 34.1 (H) 27.0 - 32.0 pg    MCHC 33.7 32.0 - 36.0 g/dL    RDW 17.6 (H) 11.6 - 14.8 %    PLATELETS 451 (H) 140 - 440 x10^3/uL    MPV 6.9 (L) 7.4 - 10.4 fL    NEUTROPHIL % 83 (H) 40 - 76 %    LYMPHOCYTE % 10 (L) 25 - 45 %    MONOCYTE % 7 0 - 12 %    EOSINOPHIL % 0 0 - 7 %    BASOPHIL % 0 0 - 3 %    NEUTROPHIL # 13.60 4.10 - 29.00 x10^3/uL    LYMPHOCYTE # 1.70 (L) 2.10 - 11.00 x10^3/uL    MONOCYTE # 1.10 0.00 - 4.10 x10^3/uL    EOSINOPHIL # 0.00 0.00 - 2.40 x10^3/uL    BASOPHIL # 0.00 0.00 - 2.50 x10^3/uL       Imaging Studies:    Results for orders placed or performed during the hospital encounter of 08/30/21   XR CHEST PA AND LATERAL     Status: None    Narrative    SR. Zekiel ANDREW Wint    RADIOLOGIST: Basim Antoun    XR CHEST PA AND LATERAL performed on 08/30/2021 1:12 PM    CLINICAL HISTORY: COPD.  copd.    TECHNIQUE: PA and lateral views of the chest were obtained.    COMPARISON: 05/24/2018.    FINDINGS: The lungs are mildly hyperexpanded but clear. The heart size mildly enlarged without change from prior study. The pulmonary vascularity is unremarkable. The  trachea is at or near the midline.      Impression    1. Mildly hyperexpanded lungs.  2. Borderline cardiomegaly.  3. No acute cardiopulmonary process, infiltrates, consolidation or effusion.  4. No significant change since prior study.      Radiologist location ID: [id number]     CT ABDOMEN PELVIS W IV CONTRAST     Status: None    Narrative    SR. Grady ANDREW Liotta    RADIOLOGIST: Tempie Donning    CT ABDOMEN PELVIS W IV CONTRAST performed on 08/30/2021 2:15 PM    CLINICAL HISTORY: nausea, chronic diarrhea.  vomiting/diarrhea     TECHNIQUE:  Abdomen and pelvis CT with intravenous contrast.  IV CONTRAST: 100 ml's of Omnipaque 350    COMPARISON:  11/26/2016  # of known CTs in the past 12 months: 0   # of known Cardiac Nuclear Medicine Studies in the past 12 months: 0    FINDINGS:  Lung bases: Clear    Liver:   The liver is enlarged. There is diffuse hepatic steatosis with lower density areas seen throughout the periphery.    Gallbladder:   Unremarkable.    Spleen:   Unremarkable.    Pancreas:   Unremarkable.    Adrenals:   Unremarkable.    Kidneys:   Stable cyst in the right kidney. No hydronephrosis.    Bladder:  Unremarkable.  Prostate:  Unremarkable.    Bowel:   There is wall thickening involving the sigmoid colon. There is some submucosal fatty infiltration throughout portions of the descending colon. There is no large or small bowel dilatation.    Appendix:  Normal.    Lymph nodes:  No suspicious lymph node enlargement.    Vasculature:   Mild diffuse atherosclerotic calcifications are noted.     Peritoneum / Retroperitoneum: No ascites.  No free air.  Bones:   Unremarkable.          Impression    Diffuse hepatic steatosis with lower density areas seen throughout the periphery of the liver possibly indicating heterogenous fatty deposition. This could be further evaluated with a follow-up nonemergent MRI of the liver with and without contrast.    There is wall thickening involving the sigmoid colon which  could represent colitis in the correct clinical setting. Please correlate with patient's clinical signs and symptoms. Follow-up colonoscopy is recommended if one has not been recently performed.      One or more dose reduction techniques were used (e.g., Automated exposure control, adjustment of the mA and/or kV according to patient size, use of iterative reconstruction technique).      Radiologist location ID: [id number]             Assessment/Plan:  Patient Active Problem List   Diagnosis   . Leucocytosis   . Anemia   . Hypokalemia   . 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 Continuous tobacco abuse   . HTN (hypertension)   . Lactic acidosis   . COPD (chronic obstructive pulmonary disease) (CMS HCC)   . Tobacco use disorder     Continue current management and supportive care. EGD Friday showed mild esophagitis,s gastritis, Barrett's esophagus, trace esophageal varices. Continue Protonix and Famotidine. Will need repeat EGD in 3 years to assess. Continue Cholestryamine at home and would send home on Prednisone 10 mg daily for 1 week. Will plan for outpatient colonoscopy. Tolerating diet. Hgb stable. He has history of fatty liver and [place] about a year ago suggesting cirrhosis. Discussed cirrhosis with him and that he needs to stop drinking. He has swelling in the legs. Lovenia Kim and I discussed pt this am. She has ordered echo and will start on Lasix. He will then FU in office for further evaluation and management of cirrhosis. Will continue to follow. Further recommendations based on clinical course.   Patient discussed in detail with Dr Keith Rake and treatment plan decided by him.   //kweatherly aprn-bc, fnp    Historical Data   Past Medical History:   Diagnosis Date   . Asthma    . Bleeding hemorrhoid    . Bleeding ulcer    . Cervical herniated disc    . COPD (chronic obstructive pulmonary disease) (CMS HCC)    . GERD (gastroesophageal reflux disease)    . Hx MRSA infection    .  Hyperlipidemia    . Hypertension    . Rotator cuff arthropathy, right    . Ulcerative colitis (CMS [place])      Past Surgical History:   Procedure Laterality Date   . HX CYST REMOVAL      lower back   . HX HERNIA REPAIR     . HX TONSILLECTOMY       Medications Prior to Admission     Prescriptions    albuterol sulfate (PROVENTIL OR VENTOLIN OR PROAIR) 90 mcg/actuation Inhalation oral inhaler    Take 1-2 Puffs by inhalation Every 6 hours as needed for Other (shortness of breath)    azithromycin (ZITHROMAX) 250 mg Oral Tablet    Take 1 Tablet (250 mg total) by mouth Once a day 2-5 day treatment    gabapentin (NEURONTIN) 100 mg Oral Capsule    Take 1 Capsule (100 mg total) by mouth Patient states gives him nightmare. Only takes 100 mg in evenings and 300 mg at night.  Ibuprofen (MOTRIN) 800 mg Oral Tablet    Take 1 Tablet (800 mg total) by mouth Three times a day as needed for Pain    methocarbamoL (ROBAXIN) 750 mg Oral Tablet    Take 1 Tablet (750 mg total) by mouth Three times a day    omeprazole (PRILOSEC) 40 mg Oral Capsule, Delayed Release(E.C.)    Take 1 Capsule (40 mg total) by mouth Once a day    promethazine (PHENERGAN) 6.25 mg/5 mL Oral Syrup    Take 5 mL (6.25 mg total) by mouth Every 6 hours as needed for Nausea/Vomiting        Allergies   Allergen Reactions   . Noctec [Chloral Hydrate] Anaphylaxis   . Penicillins Anaphylaxis   . Theophylline Shortness of Breath   . Flexeril [Cyclobenzaprine] Mental Status Effect     Homicidal ideation   . Prednisone Myalgia     Family History   Problem Relation Age of Onset   . Hypertension (High Blood Pressure) Mother    . High Cholesterol Mother    . Diabetes Mother    . Cancer Mother    . Asthma Mother    . Arthritis-osteo Mother    . Stroke Father    . Hypertension (High Blood Pressure) Father    . High Cholesterol Father    . Heart Attack Father    . Diabetes Father    . Coronary Artery Disease Father    . Congestive Heart Failure Father    . Cancer Father    . Blood  Clots Father    . Asthma Father    . Arthritis-osteo Father    . Anesthesia Complications Father    . Asthma Brother    . Stroke Maternal Grandmother    . Stroke Maternal Grandfather    . Stroke Paternal Grandmother    . Heart Attack Paternal Grandmother    . Coronary Artery Disease Paternal Grandmother    . Congestive Heart Failure Paternal Grandmother    . Blood Clots Paternal Grandmother    . Asthma Paternal Grandmother    . Stroke Paternal Grandfather    . Heart Attack Paternal Grandfather    . Coronary Artery Disease Paternal Grandfather    . Congestive Heart Failure Paternal Grandfather    . Blood Clots Paternal Grandfather    . Asthma Paternal Grandfather      Social History     Socioeconomic History   . Marital status: Married   Tobacco Use   . Smoking status: Every Day     Packs/day: 1.00     Years: 30.00     Pack years: 30.00     Types: Cigarettes   . Smokeless tobacco: Never   Vaping Use   . Vaping Use: Never used   Substance and Sexual Activity   . Alcohol use: Yes     Comment: occasional   . Drug use: Never          Delfin Edis, CFNP

## 2021-09-06 DIAGNOSIS — K2950 Unspecified chronic gastritis without bleeding: Secondary | ICD-10-CM

## 2021-09-06 DIAGNOSIS — K51811 Other ulcerative colitis with rectal bleeding: Principal | ICD-10-CM

## 2021-09-06 DIAGNOSIS — K2270 Barrett's esophagus without dysplasia: Secondary | ICD-10-CM

## 2021-09-06 DIAGNOSIS — K209 Esophagitis, unspecified without bleeding: Secondary | ICD-10-CM

## 2021-09-06 LAB — COMPREHENSIVE METABOLIC PANEL, NON-FASTING
ALBUMIN/GLOBULIN RATIO: 1.3 (ref 0.8–1.4)
ALBUMIN: 4.3 g/dL (ref 3.5–5.7)
ALKALINE PHOSPHATASE: 100 U/L (ref 34–104)
ALT (SGPT): 108 U/L — ABNORMAL HIGH (ref 7–52)
ANION GAP: 12 mmol/L (ref 10–20)
AST (SGOT): 175 U/L — ABNORMAL HIGH (ref 13–39)
BILIRUBIN TOTAL: 0.7 mg/dL (ref 0.3–1.2)
BUN/CREA RATIO: 16 (ref 6–22)
BUN: 8 mg/dL (ref 7–25)
CALCIUM, CORRECTED: 9.1 mg/dL (ref 8.9–10.8)
CALCIUM: 9.4 mg/dL (ref 8.6–10.3)
CHLORIDE: 96 mmol/L — ABNORMAL LOW (ref 98–107)
CO2 TOTAL: 29 mmol/L (ref 21–31)
CREATININE: 0.49 mg/dL — ABNORMAL LOW (ref 0.60–1.30)
ESTIMATED GFR: 131 mL/min/{1.73_m2} (ref >59)
GLOBULIN: 3.3 (ref 2.9–5.4)
GLUCOSE: 153 mg/dL — ABNORMAL HIGH (ref 74–109)
OSMOLALITY, CALCULATED: 275 mOsm/kg (ref 270–290)
POTASSIUM: 3.3 mmol/L — ABNORMAL LOW (ref 3.5–5.1)
PROTEIN TOTAL: 7.6 g/dL (ref 6.4–8.9)
SODIUM: 137 mmol/L (ref 136–145)

## 2021-09-06 LAB — ECG 12 LEAD
Atrial Rate: 80 {beats}/min
Calculated P Axis: 53 degrees
Calculated R Axis: 84 degrees
Calculated T Axis: -1 degrees
PR Interval: 166 ms
QRS Duration: 82 ms
QT Interval: 406 ms
QTC Calculation: 468 ms
Ventricular rate: 80 {beats}/min

## 2021-09-06 LAB — SURGICAL PATHOLOGY SPECIMEN

## 2021-09-06 LAB — CBC
HCT: 39.2 % — ABNORMAL LOW (ref 42.0–51.0)
HGB: 13.3 g/dL — ABNORMAL LOW (ref 13.5–18.0)
MCH: 34 pg — ABNORMAL HIGH (ref 27.0–32.0)
MCHC: 33.9 g/dL (ref 32.0–36.0)
MCV: 100.3 fL — ABNORMAL HIGH (ref 78.0–99.0)
MPV: 6.8 fL — ABNORMAL LOW (ref 7.4–10.4)
PLATELETS: 549 10*3/uL — ABNORMAL HIGH (ref 140–440)
RBC: 3.91 10*6/uL — ABNORMAL LOW (ref 4.20–6.00)
RDW: 17.3 % — ABNORMAL HIGH (ref 11.6–14.8)
WBC: 19.9 10*3/uL — ABNORMAL HIGH (ref 4.0–10.5)
WBCS UNCORRECTED: 19.9 10*3/uL

## 2021-09-06 LAB — MAGNESIUM: MAGNESIUM: 2.2 mg/dL (ref 1.9–2.7)

## 2021-09-06 MED ORDER — POTASSIUM CHLORIDE ER 20 MEQ TABLET,EXTENDED RELEASE(PART/CRYST)
60.0000 meq | ORAL_TABLET | Freq: Once | ORAL | Status: AC
Start: 2021-09-06 — End: 2021-09-06
  Administered 2021-09-06: 60 meq via ORAL
  Filled 2021-09-06: qty 3

## 2021-09-06 MED ORDER — POTASSIUM CHLORIDE ER 20 MEQ TABLET,EXTENDED RELEASE(PART/CRYST)
40.0000 meq | ORAL_TABLET | ORAL | Status: DC
Start: 2021-09-06 — End: 2021-09-06
  Administered 2021-09-06: 40 meq via ORAL
  Administered 2021-09-06: 0 meq via ORAL
  Filled 2021-09-06 (×2): qty 2

## 2021-09-06 MED ORDER — CHOLESTYRAMINE (WITH SUGAR) 4 GRAM POWDER FOR SUSP IN A PACKET
1.0000 | Freq: Every day | ORAL | 0 refills | Status: AC
Start: 2021-09-07 — End: 2021-10-07

## 2021-09-06 MED ORDER — ALBUTEROL SULFATE CONCENTRATE 2.5 MG/0.5 ML SOLUTION FOR NEBULIZATION
2.5000 mg | INHALATION_SOLUTION | Freq: Four times a day (QID) | RESPIRATORY_TRACT | 0 refills | Status: AC | PRN
Start: 2021-09-06 — End: 2021-10-06

## 2021-09-06 MED ORDER — FUROSEMIDE 40 MG TABLET
40.0000 mg | ORAL_TABLET | Freq: Two times a day (BID) | ORAL | 0 refills | Status: AC
Start: 2021-09-06 — End: 2021-09-20

## 2021-09-06 MED ORDER — PANTOPRAZOLE 40 MG TABLET,DELAYED RELEASE
40.0000 mg | DELAYED_RELEASE_TABLET | Freq: Two times a day (BID) | ORAL | 0 refills | Status: AC
Start: 2021-09-06 — End: 2021-10-06

## 2021-09-06 MED ORDER — FAMOTIDINE 40 MG TABLET
40.0000 mg | ORAL_TABLET | Freq: Every evening | ORAL | 0 refills | Status: DC
Start: 2021-09-06 — End: 2021-09-06

## 2021-09-06 MED ORDER — POTASSIUM CHLORIDE ER 20 MEQ TABLET,EXTENDED RELEASE(PART/CRYST)
40.0000 meq | ORAL_TABLET | Freq: Every day | ORAL | 0 refills | Status: AC
Start: 2021-09-06 — End: 2021-09-13

## 2021-09-06 MED ORDER — ALBUTEROL SULFATE HFA 90 MCG/ACTUATION AEROSOL INHALER
1.0000 | INHALATION_SPRAY | Freq: Four times a day (QID) | RESPIRATORY_TRACT | 0 refills | Status: DC | PRN
Start: 2021-09-06 — End: 2021-09-06

## 2021-09-06 NOTE — Nurses Notes (Signed)
Patient alert and oriented. Patient independent with care. Patient discharged home with family. AVS reviewed with patient. A written copy of the AVS and discharge instructions was given to the patient. Questions sufficiently answered as needed. Patient encouraged to follow up with PCP as indicated.  In the event of an emergency, patient instructed to call 911 or go to the nearest emergency room.

## 2021-09-06 NOTE — Discharge Summary (Signed)
[facility]  DISCHARGE SUMMARY    PATIENT NAME:  [name], [name]  MRN:  [id number]  DOB:  02/22/1976    ENCOUNTER DATE:  08/30/2021  INPATIENT ADMISSION DATE: 08/30/2021  DISCHARGE DATE:  09/06/2021    ATTENDING PHYSICIAN: Celene Kras, DO  SERVICE: PRN HOSPITALIST 4  PRIMARY CARE PHYSICIAN: Peri Maris, DO       No lay caregiver identified.      PRIMARY DISCHARGE DIAGNOSIS: Other ulcerative colitis with rectal bleeding (CMS [facility])  Active Hospital Problems    Diagnosis Date Noted   . Principal Problem: Other ulcerative colitis with rectal bleeding (CMS HCC) [K51.811] 08/30/2021   . Tobacco use disorder [F17.200] 09/01/2021   . Continuous tobacco abuse [Z72.0] 08/30/2021   . HTN (hypertension) [I10] 08/30/2021   . Lactic acidosis [E87.20] 08/30/2021   . COPD (chronic obstructive pulmonary disease) (CMS HCC) [J44.9] 08/30/2021   . Hypokalemia [E87.6] 10/07/2020   . Leucocytosis [D72.829] 09/17/2020      Resolved Hospital Problems   No resolved problems to display.     Active Non-Hospital Problems    Diagnosis Date Noted   . Anemia 09/17/2020           Current Discharge Medication List      START taking these medications.      Details   cholestyramine-sucrose 4 gram Powder in Packet  Commonly known as: Lucrezia Starch  Start taking on: September 07, 2021   1 Packet, Oral, DAILY  Qty: 30 Packet  Refills: 0     famotidine 40 mg Tablet  Commonly known as: PEPCID   40 mg, Oral, NIGHTLY  Qty: 30 Tablet  Refills: 0     furosemide 40 mg Tablet  Commonly known as: LASIX   40 mg, Oral, 2 TIMES DAILY  Qty: 28 Tablet  Refills: 0     pantoprazole 40 mg Tablet, Delayed Release (E.C.)  Commonly known as: PROTONIX   40 mg, Oral, 2 TIMES DAILY BEFORE MEALS  Qty: 60 Tablet  Refills: 0     potassium chloride 20 mEq Tab Sust.Rel. Particle/Crystal  Commonly known as: K-DUR   40 mEq, Oral, DAILY  Qty: 14 Tablet  Refills: 0        CONTINUE these medications - NO CHANGES were made during your visit.      Details   albuterol sulfate 90  mcg/actuation oral inhaler  Commonly known as: PROVENTIL or VENTOLIN or PROAIR   1-2 Puffs, Inhalation, EVERY 6 HOURS PRN  Qty: 8 g  Refills: 0     gabapentin 100 mg Capsule  Commonly known as: NEURONTIN   100 mg, Oral, Patient states gives him nightmare. Only takes 100 mg in evenings and 300 mg at night.  Refills: 0     methocarbamoL 750 mg Tablet  Commonly known as: ROBAXIN   750 mg, Oral, 3 TIMES DAILY  Refills: 0     promethazine 6.25 mg/5 mL Syrup  Commonly known as: PHENERGAN   6.25 mg, Oral, EVERY 6 HOURS PRN  Refills: 0        STOP taking these medications.    azithromycin 250 mg Tablet  Commonly known as: ZITHROMAX     Ibuprofen 800 mg Tablet  Commonly known as: MOTRIN     omeprazole 40 mg Capsule, Delayed Release(E.C.)  Commonly known as: [place]          Discharge med list refreshed?  YES     Allergies   Allergen Reactions   . Noctec [  Chloral Hydrate] Anaphylaxis   . Penicillins Anaphylaxis   . Theophylline Shortness of Breath   . Flexeril [Cyclobenzaprine] Mental Status Effect     Homicidal ideation   . Prednisone Myalgia     HOSPITAL PROCEDURE(S):   No orders of the defined types were placed in this encounter.    Surgical/Procedural Cases on this Admission     Case IDs Date Procedure Surgeon Location Status    [id number] 09/03/21 EGD WITH BIOPSY Netty Starring, MD PRN OR T&D Comp        REASON FOR HOSPITALIZATION AND HOSPITAL COURSE   BRIEF HPI:  This is a 46 y.o., male admitted for ulcerative colitis flare. Patient had complaints of abdominal pain and worsening diarrhea.  He is complaining any bloody mucus stools at the time as well.  He has not really followed very good with GI team for treatment of his UC.  He had been seen by surgery on outpatient basis, but had not established with Dr. Amedeo Gory practice.  He was started on antibiotics, steroids, and seemed to improve overall the next few days.  CT abdomen ended up showing evidence of cirrhosis.  Gastroenterology was consulted and followed along.   Patient states he does drink alcohol every day although it is not very many beers and tends to happen after he gets home from work.  He states he will quit at this time because he does not want to damage his liver any further.  He did have issues with persistent lower extremity edema and after further workup patient found to have a diastolic dysfunction most likely edema was secondary to cirrhosis.  Urine negative for signs of nephrotic syndrome.  Patient has had some issues with hypokalemia while he was being diuresed.  At this time I feel he is stable to be discharged home on p.o. diuretics.  Will send home on Lasix 40 p.o. b.i.d., as well as 40 potassium mEq for replacement.  He will also be discharged on Questran which helped his diarrhea significantly.  He is feeling much better and will need close follow-up with PCP as well as GI team.  Would benefit from Aldactone initiation in the future if blood pressure can tolerate.    Physical exam:  BP (!) 127/90   Pulse 84   Temp 36.9 C (98.4 F)   Resp 17   Ht 1.676 m ('5\' 6"'$ )   Wt 83.6 kg (184 lb 4.8 oz)   SpO2 96%   BMI 29.75 kg/m       Gen: This is a 46  Year-old male  who is awake alert and oriented x3 in no acute distress  Head:  Normocephalic/atraumatic  Eyes:  Pupils equally round and reactive light  ENT:  Membranes moist oropharynx free erythema and exudate or thrush.  No new lesions, rashes, or ulcerations.  Neck:  Supple, with normal range motion.  No adenopathy or thyromegaly  CV:  Regular rate and rhythm without murmurs rubs or gallops.  2+ pedal and radial pulses. No clubbing, cyanosis, or edema.  RESP:  Clear to auscultation bilaterally without wheezes rales or rhonchi.  Respirations are nonlabored.  GI:  Abdomen is soft, nontender, nondistended.  Bowel sounds normoactive.  GU: No foley is present  MSK:  Full range of motion of upper and lower extremities  NEURO:  Cranial nerves 2-12 grossly intact.  No focal deficits as tested.  SKIN: Warm, dry,  intact, without lesions, rashes, or ulcerations  PSYCH:  Normal affect.  Interactive  and pleasant.      CONDITION ON DISCHARGE:  A. Ambulation: Full ambulation  B. Self-care Ability: Complete  C. Cognitive Status Alert and Oriented x 3  D. Code status at discharge:       LINES/DRAINS/WOUNDS AT DISCHARGE:   Patient Lines/Drains/Airways Status     Active Line / Dialysis Catheter / Dialysis Graft / Drain / Airway / Wound     Name Placement date Placement time Site Days    Peripheral IV Left Median Cubital  (antecubital fossa) 08/30/21  1330  -- 6                DISCHARGE DISPOSITION:  Home discharge  DISCHARGE INSTRUCTIONS:  Post-Discharge Follow Up Appointments     Follow up with Peri Maris, DO in 1 week(s)    Phone: [phone]    Where: [place], [place] [address]    Follow up with Netty Starring, MD in 2 week(s)    Phone: [phone]    Where: [place] PANEL     Release to patient Automated           Celene Kras, DO    Copies sent to Care Team       Relationship Specialty Notifications Start End    Peri Maris, DO PCP - [place]  09/16/20     Phone: [phone] Fax: [phone]         [address] [place] [address]          Referring providers can utilize [email or web address] to access their referred [place] patient's information.

## 2021-09-06 NOTE — Nurses Notes (Signed)
No acute events over night. Pt states leg edema is improving. Steroids continued. No new needs at this time. Will continue to monitor.

## 2021-09-06 NOTE — Consults (Signed)
[name]    Date of Service:  09/06/2021  [name]   46 y.o. male  Date of Admission:  08/30/2021  Date of Birth:  1976-03-27  7     Reason for Consultation:  Abnormal EKG with ST-T changes and slow R-wave progression across precordial leads    Problem List:  Active Hospital Problems   (*Primary Problem)    Diagnosis   . 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 Tobacco use disorder   . Continuous tobacco abuse   . HTN (hypertension)   . Lactic acidosis   . COPD (chronic obstructive pulmonary disease) (CMS HCC)   . Hypokalemia   . Leucocytosis       History of Present Illness:  [name] is a 46 y.o. White male who presents with edema of his legs.  He had blood work and his PCP called because of abnormal blood tests.  He has history of ulcerative colitis and is being followed by the gastroenterologist.  His initial EKG had shown ST-T changes of inferior leads and slow R-wave progression without any definite infarction    He denies any chest pain and does not have any cardiac history in the past.      There is history of hypertension.  Is echocardiogram showed normal left ventricular systolic function with diastolic impairment .      History:    Past Medical:    Past Medical History:   Diagnosis Date   . Asthma    . Bleeding hemorrhoid    . Bleeding ulcer    . Cervical herniated disc    . COPD (chronic obstructive pulmonary disease) (CMS HCC)    . GERD (gastroesophageal reflux disease)    . Hx MRSA infection    . Hyperlipidemia    . Hypertension    . Rotator cuff arthropathy, right    . Ulcerative colitis (CMS [place])       Past Surgical:    Past Surgical History:   Procedure Laterality Date   . HX CYST REMOVAL      lower back   . HX HERNIA REPAIR     . HX TONSILLECTOMY        Family:    Family Medical History:     Problem Relation (Age of Onset)    Anesthesia Complications Father    Arthritis-osteo Mother, Father    Asthma Mother, Father,  Brother, Paternal Grandmother, Paternal Grandfather    Blood Clots Father, Paternal Grandmother, Paternal Grandfather    Cancer Mother, Father    Congestive Heart Failure Father, Paternal 37, Paternal Grandfather    Coronary Artery Disease Father, Paternal 66, Paternal Grandfather    Diabetes Mother, Father    Heart Attack Father, Paternal 40, Paternal Grandfather    High Cholesterol Mother, Father    Hypertension (High Blood Pressure) Mother, Father    Stroke Father, Maternal Grandmother, Maternal Grandfather, Paternal 82, Paternal Grandfather         Social:   reports that he has been smoking cigarettes. He has a 30.00 pack-year smoking history. He has never used smokeless tobacco. He reports current alcohol use. He reports that he does not use drugs.     REVIEW OF SYSTEMS:  All systems have been reviewed and found to be negative except for as stated above in the history of present illness.       Constitutional - no appetite or weight changes, no fatigue, no fevers,  chills, or night sweats  Respiratory - no dyspnea or cough  Cardiovascular - no chest pain or palpitations  Gastrointestinal - no nausea, vomiting, diarrhea, or constipation, no dyspepsia  Skin - no rashes, color changes, or lesions  Musculoskeletal - no arthralgias or myalgias  Genitourinary - no urinary frequency or dysuria, no genital discharge  Neurologic - no vision or hearing changes, no weakness, no parasthesias   Psychiatric - mood has been appropriate      Allergies   Allergen Reactions   . Noctec [Chloral Hydrate] Anaphylaxis   . Penicillins Anaphylaxis   . Theophylline Shortness of Breath   . Flexeril [Cyclobenzaprine] Mental Status Effect     Homicidal ideation   . Prednisone Myalgia       Medications:  Medications Prior to Admission     Prescriptions    albuterol sulfate (PROVENTIL OR VENTOLIN OR PROAIR) 90 mcg/actuation Inhalation oral inhaler    Take 1-2 Puffs by inhalation Every 6 hours as needed for  Other (shortness of breath)    azithromycin (ZITHROMAX) 250 mg Oral Tablet    Take 1 Tablet (250 mg total) by mouth Once a day 2-5 day treatment    gabapentin (NEURONTIN) 100 mg Oral Capsule    Take 1 Capsule (100 mg total) by mouth Patient states gives him nightmare. Only takes 100 mg in evenings and 300 mg at night.    Ibuprofen (MOTRIN) 800 mg Oral Tablet    Take 1 Tablet (800 mg total) by mouth Three times a day as needed for Pain    methocarbamoL (ROBAXIN) 750 mg Oral Tablet    Take 1 Tablet (750 mg total) by mouth Three times a day    omeprazole (PRILOSEC) 40 mg Oral Capsule, Delayed Release(E.C.)    Take 1 Capsule (40 mg total) by mouth Once a day    promethazine (PHENERGAN) 6.25 mg/5 mL Oral Syrup    Take 5 mL (6.25 mg total) by mouth Every 6 hours as needed for Nausea/Vomiting        acetaminophen (TYLENOL) tablet, 650 mg, Oral, Q4H PRN  albuterol (PROVENTIL) 2.5 mg / 3 mL (0.083%) neb solution, 2.5 mg, Nebulization, Q6H  albuterol (PROVENTIL) 2.5 mg / 3 mL (0.083%) neb solution, 2.5 mg, Nebulization, Q4H PRN  aluminum-magnesium hydroxide-simethicone (MAG-AL PLUS) 200-200-20 mg per 5 mL oral liquid, 10 mL, Oral, Q4H PRN  cholestyramine-sucrose (QUESTRAN) 4 gram packet, 1 Packet, Oral, Daily  famotidine (PEPCID) tablet, 40 mg, Oral, NIGHTLY  folic acid (FOLVITE) tablet, 1 mg, Oral, Daily  furosemide (LASIX) 10 mg/mL injection, 80 mg, Intravenous, 2x/day  guaiFENesin '100mg'$  per 105m oral liquid - for cough (expectorant), 200 mg, Oral, Q4H PRN  methocarbamol (ROBAXIN) tablet, 750 mg, Oral, 3x/day  methylPREDNISolone sod succ (SOLU-MEDROL) 40 mg/mL injection, 20 mg, Intravenous, Q8H  nicotine (NICODERM CQ) transdermal patch (mg/24 hr), 21 mg, Transdermal, Daily  oxyCODONE-acetaminophen (PERCOCET) 5-'325mg'$  per tablet, 1 Tablet, Oral, Q4H PRN  pantoprazole (PROTONIX) delayed release tablet, 40 mg, Oral, 2x/day AC  potassium chloride (K-DUR) extended release tablet, 40 mEq, Oral, Q4H  ramelteon (ROZEREM) tablet, 8  mg, Oral, HS PRN          Physical Exam:  General:  Alert and not in any kind of distress  Cardiac: Regular rate and rhythm, no murmur auscultated.  Respiratory: Clear to auscultation bilaterally without wheeze.  Abdomen: Positive bowel sounds, soft, nontender  Extremities:  Has bilateral peripheral edema  Vitals:  Temperature: 36.2 C (97.2 F)  Heart Rate: 77  Respiratory Rate: 18  BP (Non-Invasive): (!) 140/92  SpO2: 96 %    Nursing note and vitals reviewed.     Labs:     Results for orders placed or performed during the hospital encounter of 08/30/21 (from the past 24 hour(s))   ECG 12 LEAD   Result Value Ref Range    Ventricular rate 80 BPM    Atrial Rate 80 BPM    PR Interval 166 ms    QRS Duration 82 ms    QT Interval 406 ms    QTC Calculation 468 ms    Calculated P Axis 53 degrees    Calculated R Axis 84 degrees    Calculated T Axis -1 degrees   CBC   Result Value Ref Range    WBCS UNCORRECTED 19.9 x10^3/uL    WBC 19.9 (H) 4.0 - 10.5 x10^3/uL    RBC 3.91 (L) 4.20 - 6.00 x10^6/uL    HGB 13.3 (L) 13.5 - 18.0 g/dL    HCT 39.2 (L) 42.0 - 51.0 %    MCV 100.3 (H) 78.0 - 99.0 fL    MCH 34.0 (H) 27.0 - 32.0 pg    MCHC 33.9 32.0 - 36.0 g/dL    RDW 17.3 (H) 11.6 - 14.8 %    PLATELETS 549 (H) 140 - 440 x10^3/uL    MPV 6.8 (L) 7.4 - 10.4 fL   MAGNESIUM   Result Value Ref Range    MAGNESIUM 2.2 1.9 - 2.7 mg/dL   COMPREHENSIVE METABOLIC PANEL, NON-FASTING   Result Value Ref Range    SODIUM 137 136 - 145 mmol/L    POTASSIUM 3.3 (L) 3.5 - 5.1 mmol/L    CHLORIDE 96 (L) 98 - 107 mmol/L    CO2 TOTAL 29 21 - 31 mmol/L    ANION GAP 12 10 - 20 mmol/L    BUN 8 7 - 25 mg/dL    CREATININE 0.49 (L) 0.60 - 1.30 mg/dL    BUN/CREA RATIO 16 6 - 22    ESTIMATED GFR 131 >59 mL/min/1.56m2    ALBUMIN 4.3 3.5 - 5.7 g/dL    CALCIUM 9.4 8.6 - 10.3 mg/dL    GLUCOSE 153 (H) 74 - 109 mg/dL    ALKALINE PHOSPHATASE 100 34 - 104 U/L    ALT (SGPT) 108 (H) 7 - 52 U/L    AST (SGOT) 175 (H) 13 - 39 U/L    BILIRUBIN TOTAL 0.7 0.3 - 1.2 mg/dL    PROTEIN  TOTAL 7.6 6.4 - 8.9 g/dL    ALBUMIN/GLOBULIN RATIO 1.3 0.8 - 1.4    OSMOLALITY, CALCULATED 275 270 - 290 mOsm/kg    CALCIUM, CORRECTED 9.1 8.9 - 10.8 mg/dL    GLOBULIN 3.3 2.9 - 5.4       Diagnostic Tests   Reviewed:     Most Recent EKG This Encounter   ECG 12 LEAD    Collection Time: 09/05/21  2:00 PM   Result Value    Ventricular rate 80    Atrial Rate 80    PR Interval 166    QRS Duration 82    QT Interval 406    QTC Calculation 468    Calculated P Axis 53    Calculated R Axis 84    Calculated T Axis -1    Narrative    Normal sinus rhythm  Anterior infarct , age undetermined  Abnormal ECG  When compared with ECG of 30-Aug-2021 12:50,  T wave inversion now evident in  Inferior leads  T wave amplitude has increased in Lateral leads  Confirmed by Anderson Malta ([id number]) on 09/06/2021 9:00:14 AM     No results found for this or any previous visit.   '@LASTECHORESULTS'$ @   '@LASTECHO'$ @     Active Hospital Problems    Diagnosis   . 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 Tobacco use disorder   . Continuous tobacco abuse   . HTN (hypertension)   . Lactic acidosis   . COPD (chronic obstructive pulmonary disease) (CMS HCC)   . Hypokalemia   . Leucocytosis        Assessment:   1. Nonspecific ST-T changes on EKG with slow R-wave progression but without myocardial infarction.      2. Mild  diastolic impairment on echocardiogram with normal left ventricular systolic function .  3. History of hypertension  4. Ulcerative colitis.  Plan:   Patient's cardiac status is stable and his EKGs are nonspecific.  He has mild diastolic left ventricular impairment on echocardiogram secondary to his hypertension.      He does not have any acute cardiac problems at this time and does not need any cardiac intervention.  Thank you very much for asking me to see him.    Hewitt Shorts, MD       This note was partially generated using MModal Fluency Direct system, and there may be some incorrect words, spellings, and  punctuation that were not noted in checking the note before saving.

## 2021-09-06 NOTE — Care Plan (Signed)
Problem: Health Knowledge, Opportunity to Enhance (Adult,Obstetrics,Pediatric)  Goal: Knowledgeable about Health Subject/Topic  Description: Patient will demonstrate the desired outcomes by discharge/transition of care.  Outcome: Adequate for Discharge     Problem: Adult Inpatient Plan of Care  Goal: Plan of Care Review  Outcome: Adequate for Discharge  Goal: Patient-Specific Goal (Individualized)  Outcome: Adequate for Discharge  Goal: Absence of Hospital-Acquired Illness or Injury  Outcome: Adequate for Discharge  Goal: Optimal Comfort and Wellbeing  Outcome: Adequate for Discharge  Goal: Rounds/Family Conference  Outcome: Adequate for Discharge     Problem: Fall Injury Risk  Goal: Absence of Fall and Fall-Related Injury  Outcome: Adequate for Discharge     Problem: Asthma Comorbidity  Goal: Maintenance of Asthma Control  Outcome: Adequate for Discharge     Problem: Hypertension Comorbidity  Goal: Blood Pressure in Desired Range  Outcome: Adequate for Discharge     Problem: Pain Chronic (Persistent) (Comorbidity Management)  Goal: Acceptable Pain Control and Functional Ability  Outcome: Adequate for Discharge

## 2021-09-06 NOTE — Care Management Notes (Signed)
Met with patient to discuss his discharge plans and ETOH use.  Patient advised that he lives at home with his wife and two children (ages 27 and 6).  Patient states that he is independent with ambulation and his only DME at home is a nebulizer.  Patient advised that he has applied for disability about one year ago and was denied. He currently has [facility].  When discussing alcohol use, patient advised that he drinks about 5 beers a day for the last 12 years.  He also advised that he smokes a pack of cigarettes a day.  Discussed AA and patient was agreeable to accept the information although he does not feel that he will attend meetings.  A listing of AA meeting sites were provided.  No other needs or barriers to returning home where identified at this time.

## 2021-09-06 NOTE — Consults (Signed)
[facility]  Gastroenterology/ Hepatology Progress Note      [name]  Date of service: 09/06/2021    Subjective      Tolerating diet well.  Denies any N/V.  States he has been able to eat more while here than he has in a long time.  Reports chronic abd pain related to a hernia.  States that diarrhea has improved to only 2 BMs daily with cholestyramine.  Hgb stable at 13.3.  Had echo yesterday that showed EF 55-60%    acetaminophen (TYLENOL) tablet, 650 mg, Oral, Q4H PRN  albuterol (PROVENTIL) 2.5 mg / 3 mL (0.083%) neb solution, 2.5 mg, Nebulization, Q6H  albuterol (PROVENTIL) 2.5 mg / 3 mL (0.083%) neb solution, 2.5 mg, Nebulization, Q4H PRN  aluminum-magnesium hydroxide-simethicone (MAG-AL PLUS) 200-200-20 mg per 5 mL oral liquid, 10 mL, Oral, Q4H PRN  cholestyramine-sucrose (QUESTRAN) 4 gram packet, 1 Packet, Oral, Daily  famotidine (PEPCID) tablet, 40 mg, Oral, NIGHTLY  folic acid (FOLVITE) tablet, 1 mg, Oral, Daily  furosemide (LASIX) 10 mg/mL injection, 80 mg, Intravenous, 2x/day  guaiFENesin 159m per 553moral liquid - for cough (expectorant), 200 mg, Oral, Q4H PRN  methocarbamol (ROBAXIN) tablet, 750 mg, Oral, 3x/day  methylPREDNISolone sod succ (SOLU-MEDROL) 40 mg/mL injection, 20 mg, Intravenous, Q8H  nicotine (NICODERM CQ) transdermal patch (mg/24 hr), 21 mg, Transdermal, Daily  oxyCODONE-acetaminophen (PERCOCET) 5-32579mer tablet, 1 Tablet, Oral, Q4H PRN  pantoprazole (PROTONIX) delayed release tablet, 40 mg, Oral, 2x/day AC  potassium chloride (K-DUR) extended release tablet, 40 mEq, Oral, Q4H  ramelteon (ROZEREM) tablet, 8 mg, Oral, HS PRN        Allergies   Allergen Reactions   . Noctec [Chloral Hydrate] Anaphylaxis   . Penicillins Anaphylaxis   . Theophylline Shortness of Breath   . Flexeril [Cyclobenzaprine] Mental Status Effect     Homicidal ideation   . Prednisone Myalgia       Objective     Vital Signs:  Temp (24hrs) Max:36.9 C (98.99.8      Systolic (24h33ASN AvgKNL:976Min:126 , MaxBHA:193  Diastolic (24h79KWIAvgOXB:35in:79, Max:95    Temp  Avg: 36.4 C (97.6 F)  Min: 36.1 C (97 F)  Max: 36.9 C (98.4 F)  Pulse  Avg: 78.9  Min: 72  Max: 84  Resp  Avg: 17.8  Min: 17  Max: 18  SpO2  Avg: 96.6 %  Min: 94 %  Max: 99 %       I/O:  I/O last 24 hours:      Intake/Output Summary (Last 24 hours) at 09/06/2021 1310  Last data filed at 09/06/2021 0930  Gross per 24 hour   Intake 120 ml   Output --   Net 120 ml     I/O current shift:  03/20 0700 - 03/20 1859  In: 120 [P.O.:120]  Out: -     Physical Exam:  Constitutional:  no distress  Respiratory:  Clear to auscultation bilaterally.   Cardiovascular:  regular rate and rhythm  Gastrointestinal:  Soft, non-tender, Bowel sounds normal    Labs:  No results found for: ALPHFETOPROT, CA199X   CBC  Diff   Lab Results   Component Value Date/Time    WBC 19.9 (H) 09/06/2021 06:27 AM    HGB 13.3 (L) 09/06/2021 06:27 AM    HCT 39.2 (L) 09/06/2021 06:27 AM    PLTCNT 549 (H) 09/06/2021 06:27 AM    RBC 3.91 (L) 09/06/2021 06:27  AM    MCV 100.3 (H) 09/06/2021 06:27 AM    MCHC 33.9 09/06/2021 06:27 AM    MCH 34.0 (H) 09/06/2021 06:27 AM    RDW 17.3 (H) 09/06/2021 06:27 AM    MPV 6.8 (L) 09/06/2021 06:27 AM    Lab Results   Component Value Date/Time    PMNS 83 (H) 09/05/2021 08:51 AM    LYMPHOCYTES 10 (L) 09/05/2021 08:51 AM    EOSINOPHIL 0 09/05/2021 08:51 AM    MONOCYTES 7 09/05/2021 08:51 AM    BASOPHILS 0 09/05/2021 08:51 AM    BASOPHILS 0.00 09/05/2021 08:51 AM    PMNABS 13.60 09/05/2021 08:51 AM    LYMPHSABS 1.70 (L) 09/05/2021 08:51 AM    EOSABS 0.00 09/05/2021 08:51 AM    MONOSABS 1.10 09/05/2021 08:51 AM           Last BMP  (Last result in the past 24 hours)      Na   K   Cl   CO2   BUN   Cr   Calcium   Glucose   Glucose-Fasting        09/06/21 0627 137   3.3   96   29   8   0.49   9.4   153           Last Hepatic Panel  (Last result in the past 24 hours)      Albumin   Total PTN   Total Bili   Direct Bili   Ast/SGOT   Alt/SGPT   Alk Phos         09/06/21 0627 4.3   7.6   0.7     175   108   100              Radiology:    No results found for this or any previous visit (from the past 003704888 hour(s)).  No results found for this or any previous visit (from the past [id number] hour(s)).  Recent Results (from the past [id number] hour(s))   CT ABDOMEN PELVIS W IV CONTRAST    Collection Time: 08/30/21  2:15 PM    Narrative    SR. Brion ANDREW Conti    RADIOLOGIST: Tempie Donning    CT ABDOMEN PELVIS W IV CONTRAST performed on 08/30/2021 2:15 PM    CLINICAL HISTORY: nausea, chronic diarrhea.  vomiting/diarrhea     TECHNIQUE:  Abdomen and pelvis CT with intravenous contrast.  IV CONTRAST: 100 ml's of Omnipaque 350    COMPARISON:  11/26/2016  # of known CTs in the past 12 months: 0   # of known Cardiac Nuclear Medicine Studies in the past 12 months: 0    FINDINGS:  Lung bases: Clear    Liver:   The liver is enlarged. There is diffuse hepatic steatosis with lower density areas seen throughout the periphery.    Gallbladder:   Unremarkable.    Spleen:   Unremarkable.    Pancreas:   Unremarkable.    Adrenals:   Unremarkable.    Kidneys:   Stable cyst in the right kidney. No hydronephrosis.    Bladder:  Unremarkable.  Prostate:  Unremarkable.    Bowel:   There is wall thickening involving the sigmoid colon. There is some submucosal fatty infiltration throughout portions of the descending colon. There is no large or small bowel dilatation.    Appendix:  Normal.    Lymph nodes:  No suspicious lymph node enlargement.    Vasculature:  Mild diffuse atherosclerotic calcifications are noted.     Peritoneum / Retroperitoneum: No ascites.  No free air.    Bones:   Unremarkable.          Impression    Diffuse hepatic steatosis with lower density areas seen throughout the periphery of the liver possibly indicating heterogenous fatty deposition. This could be further evaluated with a follow-up nonemergent MRI of the liver with and without contrast.    There is wall  thickening involving the sigmoid colon which could represent colitis in the correct clinical setting. Please correlate with patient's clinical signs and symptoms. Follow-up colonoscopy is recommended if one has not been recently performed.      One or more dose reduction techniques were used (e.g., Automated exposure control, adjustment of the mA and/or kV according to patient size, use of iterative reconstruction technique).      Radiologist location ID: [id number]         Microbiology:  No results found for any visits on 08/30/21 (from the past 24 hour(s)).     Assessment/ Plan:   Active Hospital Problems    Diagnosis   . 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 Tobacco use disorder   . Continuous tobacco abuse   . HTN (hypertension)   . Lactic acidosis   . COPD (chronic obstructive pulmonary disease) (CMS HCC)   . Hypokalemia   . Leucocytosis     Continue current management and supportive care.  Continue Cholestryamine at home and would send home on Prednisone 10 mg daily for 1 week. Will plan for outpatient colonoscopy.  He has history of fatty liver and U/S about a year ago suggesting cirrhosis. Discussed cirrhosis with him and that he needs to stop drinking.  F/U in our office after d/c for further evaluation and management of cirrhosis and to schedule colonoscopy.  Patient discussed in detail with Dr Keith Rake to determine treatment plan.     Adline Potter, NP-C

## 2021-09-09 ENCOUNTER — Ambulatory Visit: Payer: Medicaid Other | Attending: Internal Medicine

## 2021-09-09 ENCOUNTER — Other Ambulatory Visit: Payer: Self-pay

## 2021-09-09 DIAGNOSIS — E876 Hypokalemia: Secondary | ICD-10-CM | POA: Insufficient documentation

## 2021-09-09 LAB — BASIC METABOLIC PANEL
ANION GAP: 7 mmol/L — ABNORMAL LOW (ref 10–20)
BUN/CREA RATIO: 14 (ref 6–22)
BUN: 9 mg/dL (ref 7–25)
CALCIUM: 9.7 mg/dL (ref 8.6–10.3)
CHLORIDE: 99 mmol/L (ref 98–107)
CO2 TOTAL: 30 mmol/L (ref 21–31)
CREATININE: 0.64 mg/dL (ref 0.60–1.30)
ESTIMATED GFR: 120 mL/min/{1.73_m2} (ref >59)
GLUCOSE: 154 mg/dL — ABNORMAL HIGH (ref 74–109)
OSMOLALITY, CALCULATED: 274 mOsm/kg (ref 270–290)
POTASSIUM: 4 mmol/L (ref 3.5–5.1)
SODIUM: 136 mmol/L (ref 136–145)

## 2021-09-13 ENCOUNTER — Telehealth (HOSPITAL_COMMUNITY): Payer: Self-pay | Admitting: Family Medicine

## 2021-09-13 NOTE — Telephone Encounter (Signed)
Patient called dietitian's office.  He reports a history including Barrett's esophagus, ulcers, cirrhosis, fluid retention, alcohol intake and smokes cigarettes.  He had questions regarding his diet related to his problems.  Reviewed nutrition information and written materials mailed to his home address.  Encouraged follow up with office as needed.

## 2021-11-01 ENCOUNTER — Ambulatory Visit (INDEPENDENT_AMBULATORY_CARE_PROVIDER_SITE_OTHER): Payer: Self-pay | Admitting: Family

## 2021-11-11 ENCOUNTER — Ambulatory Visit (INDEPENDENT_AMBULATORY_CARE_PROVIDER_SITE_OTHER): Payer: Medicaid Other | Admitting: Family

## 2021-11-11 ENCOUNTER — Other Ambulatory Visit: Payer: Medicaid Other | Attending: Family | Admitting: Family

## 2021-11-11 ENCOUNTER — Encounter (INDEPENDENT_AMBULATORY_CARE_PROVIDER_SITE_OTHER): Payer: Self-pay | Admitting: Family

## 2021-11-11 ENCOUNTER — Other Ambulatory Visit: Payer: Self-pay

## 2021-11-11 VITALS — BP 122/76 | HR 86 | Resp 18 | Ht 66.0 in | Wt 180.4 lb

## 2021-11-11 DIAGNOSIS — R3911 Hesitancy of micturition: Secondary | ICD-10-CM

## 2021-11-11 DIAGNOSIS — N390 Urinary tract infection, site not specified: Secondary | ICD-10-CM | POA: Insufficient documentation

## 2021-11-11 DIAGNOSIS — Z6829 Body mass index (BMI) 29.0-29.9, adult: Secondary | ICD-10-CM

## 2021-11-11 DIAGNOSIS — R339 Retention of urine, unspecified: Secondary | ICD-10-CM

## 2021-11-11 DIAGNOSIS — R3912 Poor urinary stream: Secondary | ICD-10-CM

## 2021-11-11 LAB — POCT URINE DIPSTICK
BLOOD: NEGATIVE
GLUCOSE: NEGATIVE
KETONE: 15
LEUKOCYTES: NEGATIVE
NITRITE: POSITIVE
PH: 5.5
PROTEIN: 100
SPECIFIC GRAVITY: 1.02
UROBILINOGEN: 4

## 2021-11-11 LAB — POCT PVR

## 2021-11-11 MED ORDER — ALFUZOSIN ER 10 MG TABLET,EXTENDED RELEASE 24 HR
10.0000 mg | ORAL_TABLET | Freq: Every day | ORAL | 0 refills | Status: AC
Start: 2021-11-11 — End: 2021-12-11

## 2021-11-11 NOTE — Progress Notes (Signed)
UROLOGY, [facility]  [address]  [place] [address]      History and Physical Notes     Name: [name] MRN:  [id number]   Date: 11/11/2021 Age/DOB:45 y.o. 03/19/1976        Name: [name]                       Date of Birth: 1975-11-14   MRN:  [id number]                         Date of visit: 11/11/2021     PCP: George Maris, DO   Referring Provider: Peri [name], [place],  [place] [address]     HPI:  [name] is a 46 y.o. male who presents with Urinary Pain (New patient, complains or urgency and weak stream. States he has to stand for a little while before stream will get started.)   to clinic.      He has hesitancy, weak stream and bladder pressure. He had colonoscopy yesterday and feels that he has been dehydrated with colon prep. He has Hx of stones but doesn't have pain like he did with previous stone.     Past Medical History  Current Outpatient Medications   Medication Sig   . alfuzosin (UROXATRAL) 10 mg Oral Tablet Sustained Release 24 hr Take 1 Tablet (10 mg total) by mouth Once a day for 30 days Indications: enlarged prostate with urination problem   . ASACOL HD 800 mg Oral Tablet, Delayed Release (E.C.) Take 1 Tablet (800 mg total) by mouth Once a day   . cholestyramine-sucrose (QUESTRAN) 4 gram Oral Powder in Packet Take 1 Packet by mouth Once a day   . famotidine (PEPCID) 40 mg Oral Tablet Take 1 Tablet (40 mg total) by mouth Once a day   . gabapentin (NEURONTIN) 100 mg Oral Capsule Take 1 Capsule (100 mg total) by mouth Patient states gives him nightmare. Only takes 100 mg in evenings and 300 mg at night.   . methocarbamoL (ROBAXIN) 750 mg Oral Tablet Take 1 Tablet (750 mg total) by mouth Three times a day   . pantoprazole (PROTONIX) 40 mg Oral Tablet, Delayed Release (E.C.) Take 1 Tablet (40 mg total) by mouth Once a day   . spironolactone (ALDACTONE) 50 mg Oral Tablet Take 1 Tablet (50 mg total) by mouth Once a day      Allergies   Allergen Reactions   . Noctec [Chloral Hydrate] Anaphylaxis   . Penicillins Anaphylaxis   . Theophylline Shortness of Breath   . Flexeril [Cyclobenzaprine] Mental Status Effect     Homicidal ideation   . Prednisone Myalgia     Past Medical History:   Diagnosis Date   . Asthma    . Bleeding hemorrhoid    . Bleeding ulcer    . Cervical herniated disc    . COPD (chronic obstructive pulmonary disease) (CMS HCC)    . GERD (gastroesophageal reflux disease)    . Hx MRSA infection    . Hyperlipidemia    . Hypertension    . Rotator cuff arthropathy, right    . Ulcerative colitis (CMS [place])          Past Surgical History:   Procedure Laterality Date   . HX CYST REMOVAL      lower back   . HX HERNIA REPAIR     .  HX TONSILLECTOMY           Family Medical History:     Problem Relation (Age of Onset)    Anesthesia Complications Father    Arthritis-osteo Mother, Father    Asthma Mother, Father, Brother, Paternal Grandmother, Paternal Grandfather    Blood Clots Father, Paternal Grandmother, Paternal Grandfather    Cancer Mother, Father    Congestive Heart Failure Father, Paternal 54, Paternal Grandfather    Coronary Artery Disease Father, Paternal 41, Paternal Grandfather    Diabetes Mother, Father    Heart Attack Father, Paternal 88, Paternal Grandfather    High Cholesterol Mother, Father    Hypertension (High Blood Pressure) Mother, Father    Stroke Father, Maternal Grandmother, Maternal Grandfather, Paternal 6, Paternal Grandfather          Social History     Socioeconomic History   . Marital status: Married   Tobacco Use   . Smoking status: Every Day     Packs/day: 1.00     Years: 30.00     Pack years: 30.00     Types: Cigarettes   . Smokeless tobacco: Never   Vaping Use   . Vaping Use: Never used   Substance and Sexual Activity   . Alcohol use: Yes     Comment: occasional   . Drug use: Never     Social Determinants of Health     Financial Resource Strain: Low Risk    . SDOH  Financial: No   Transportation Needs: Low Risk    . SDOH Transportation: No   Social Connections: Low Risk    . SDOH Social Isolation: 5 or more times a week   Intimate Partner Violence: Low Risk    . SDOH Domestic Violence: No   Housing Stability: Low Risk    . SDOH Housing Situation: I have housing.   Marland Kitchen SDOH Housing Worry: No        Patient Active Problem List    Diagnosis Date Noted   . Urinary hesitancy 11/11/2021   . UTI (urinary tract infection) 11/11/2021   . Weak urinary stream 11/11/2021   . Tobacco use disorder 09/01/2021   . Other ulcerative colitis with rectal bleeding (CMS HCC) 08/30/2021   . Continuous tobacco abuse 08/30/2021   . HTN (hypertension) 08/30/2021   . Lactic acidosis 08/30/2021   . COPD (chronic obstructive pulmonary disease) (CMS HCC) 08/30/2021   . Hypokalemia 10/07/2020   . Leucocytosis 09/17/2020   . Anemia 09/17/2020        REVIEW OF SYSTEMS:   As per HPI.    Physical Exam  Constitutional:       General: He is not in acute distress.  Pulmonary:      Effort: Pulmonary effort is normal. No respiratory distress.   Neurological:      Mental Status: He is alert.   Psychiatric:         Mood and Affect: Mood normal.         Behavior: Behavior normal.       BP 122/76   Pulse 86   Resp 18   Ht 1.676 m ('5\' 6"'$ )   Wt 81.8 kg (180 lb 6.4 oz)   SpO2 96%   BMI 29.12 kg/m         Assessment/Plan  Assessment/Plan   1. Incomplete bladder emptying    2. UTI (urinary tract infection)    3. Urinary hesitancy    4. Weak urinary stream  Orders Placed This Encounter   . URINE CULTURE   . POCT PVR   . POCT Urine Dipstick   . alfuzosin (UROXATRAL) 10 mg Oral Tablet Sustained Release 24 hr        Plan-Urine culture today. Start Uroxatral 10 mg daily. Follow-up in 1 month with UA/PVR.    George Samson, FNP-C

## 2021-11-13 LAB — URINE CULTURE: URINE CULTURE: NO GROWTH

## 2021-12-09 ENCOUNTER — Encounter (INDEPENDENT_AMBULATORY_CARE_PROVIDER_SITE_OTHER): Payer: Self-pay | Admitting: Family

## 2022-01-10 DIAGNOSIS — K2270 Barrett's esophagus without dysplasia: Secondary | ICD-10-CM | POA: Insufficient documentation

## 2022-01-10 DIAGNOSIS — K279 Peptic ulcer, site unspecified, unspecified as acute or chronic, without hemorrhage or perforation: Secondary | ICD-10-CM | POA: Insufficient documentation

## 2022-01-10 DIAGNOSIS — E785 Hyperlipidemia, unspecified: Secondary | ICD-10-CM | POA: Insufficient documentation

## 2022-02-14 ENCOUNTER — Other Ambulatory Visit (HOSPITAL_COMMUNITY): Payer: Self-pay | Admitting: Family

## 2022-02-14 DIAGNOSIS — D375 Neoplasm of uncertain behavior of rectum: Secondary | ICD-10-CM

## 2022-02-25 ENCOUNTER — Ambulatory Visit (HOSPITAL_COMMUNITY): Payer: Medicaid Other

## 2022-03-04 IMAGING — MR MRI LUMBAR SPINE WITHOUT CONTRAST
4 of 6 series · 29 of 48 positions shown · IV contrast (gadolinium)
Comparison: Outside radiographic examination of lumbar spine is not available for comparison.

﻿EXAM:  10481   MRI LUMBAR SPINE WITHOUT CONTRAST
INDICATION: Back pain after sustaining trauma due to fall, 1 month ago.  Bilateral hip pain.  No prior back surgery.
TECHNIQUE: Multiplanar, multisequential MRI of the lumbosacral spine was performed without gadolinium contrast.

[Series 5: T2 · sagittal · 4.0mm · 0.94mm/px · 6 of 13 slices shown (1 of 3)]
[im 1/13]
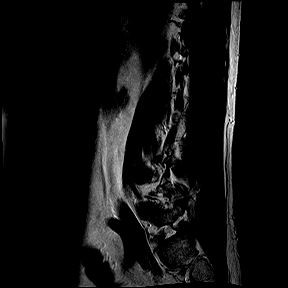
[im 3/13]
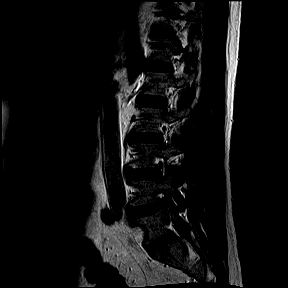
[im 5/13]
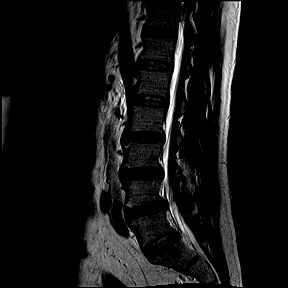
[im 8/13]
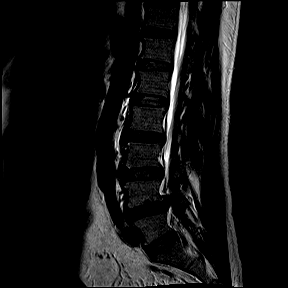
[im 10/13]
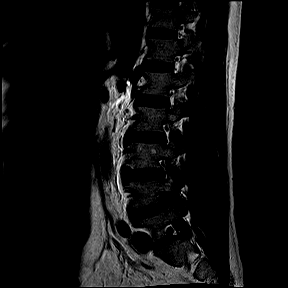
[im 13/13]
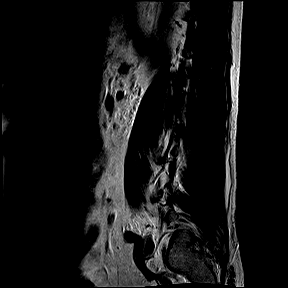

[Series 6: T1 · sagittal · 4.0mm · 0.94mm/px · 4 of 13 slices shown]
[im 1/13]
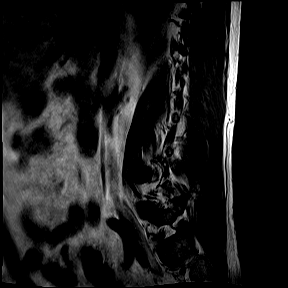
[im 3/13]
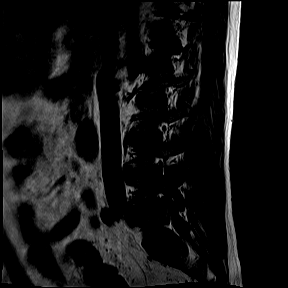
[im 8/13]
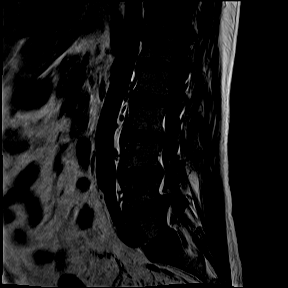
[im 13/13]
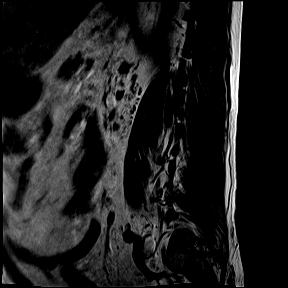

[Series 8: T2 · coronal · 5.0mm · 0.82mm/px · 8 of 18 slices shown (2 of 3)]
[im 1/18]
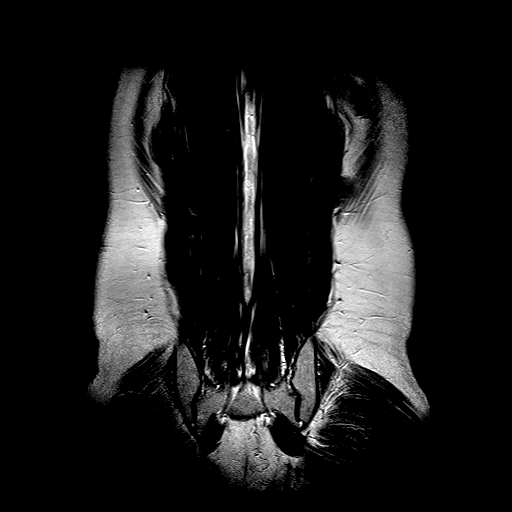
[im 3/18]
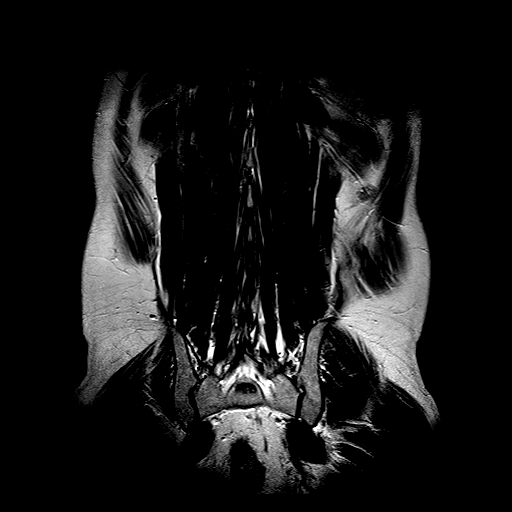
[im 5/18]
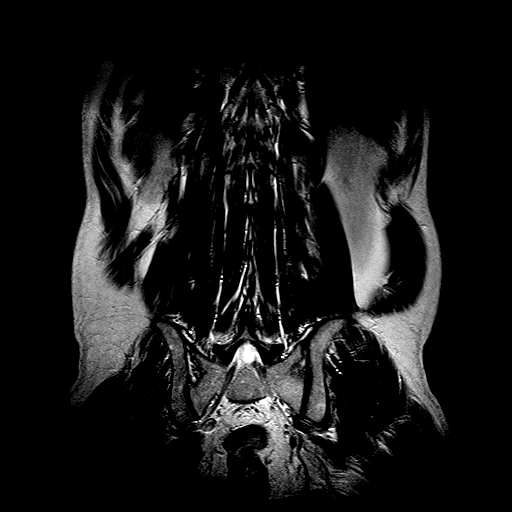
[im 8/18]
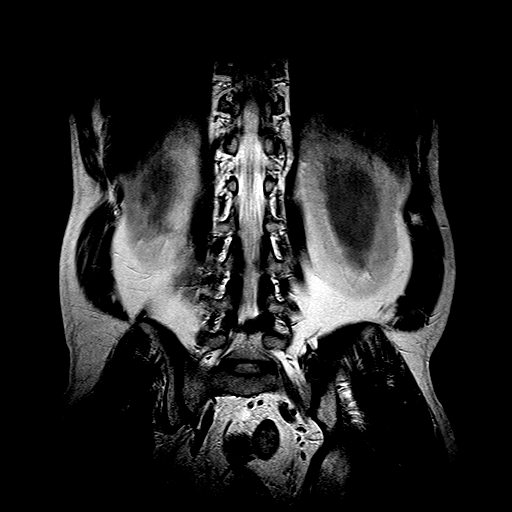
[im 10/18]
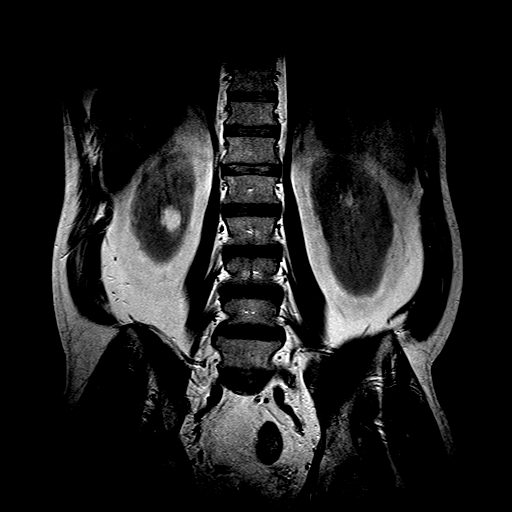
[im 13/18]
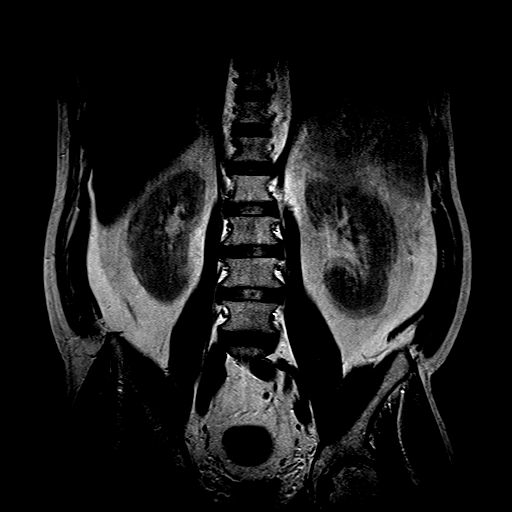
[im 15/18]
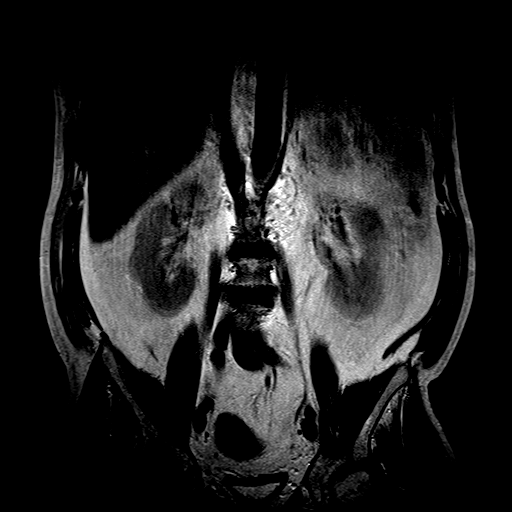
[im 18/18]
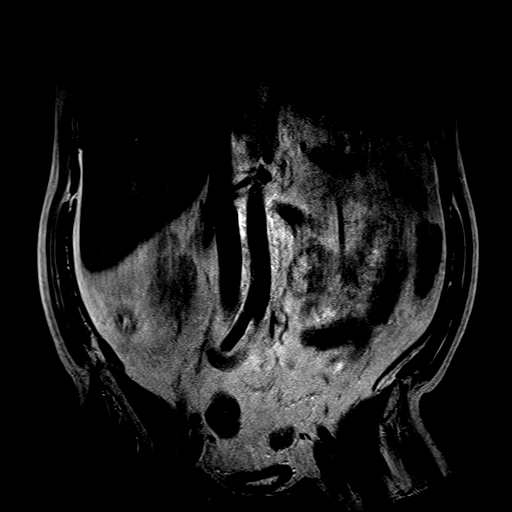

[Series 9: T2 · axial · 4.0mm · 0.52mm/px · z∈[-172,+35]mm · 11 of 23 slices shown (3 of 3)]
[im 1/23]
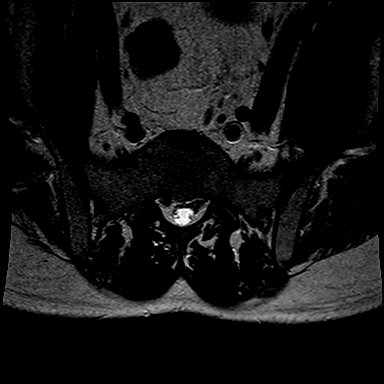
[im 3/23]
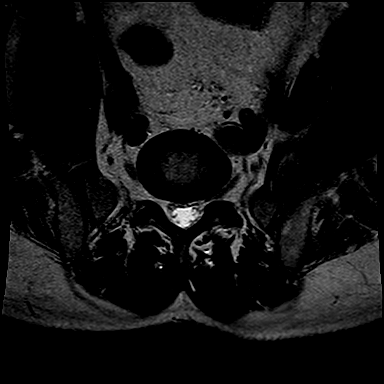
[im 5/23]
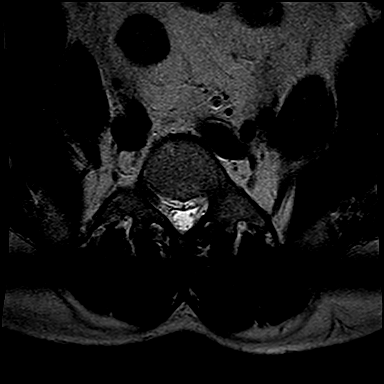
[im 7/23]
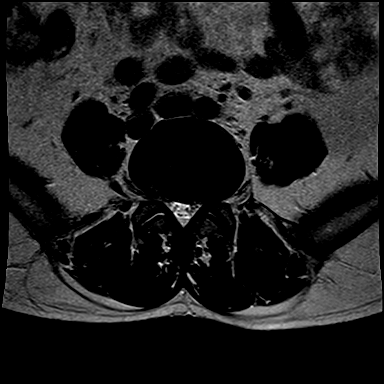
[im 9/23]
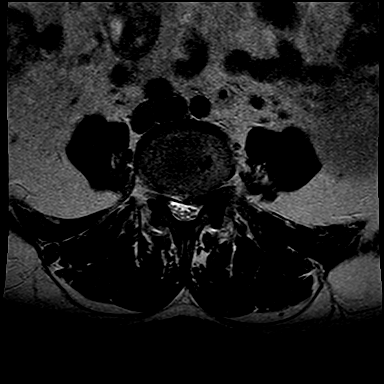
[im 12/23]
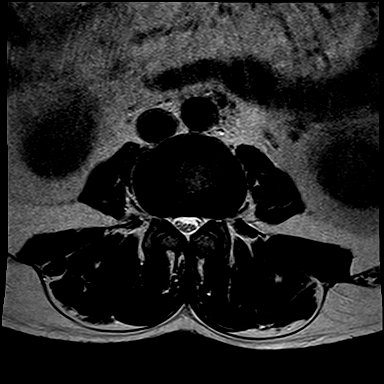
[im 14/23]
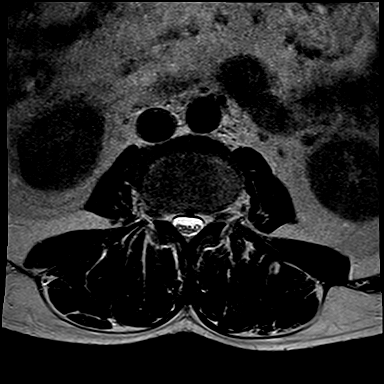
[im 16/23]
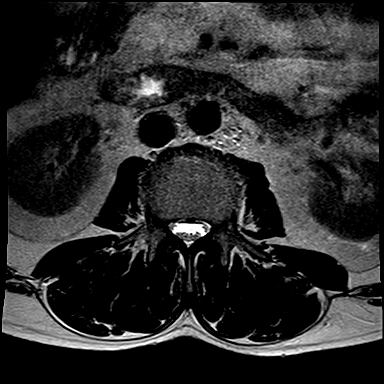
[im 18/23]
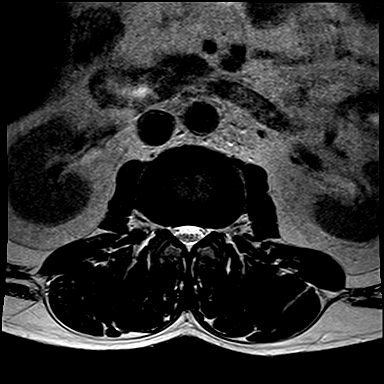
[im 20/23]
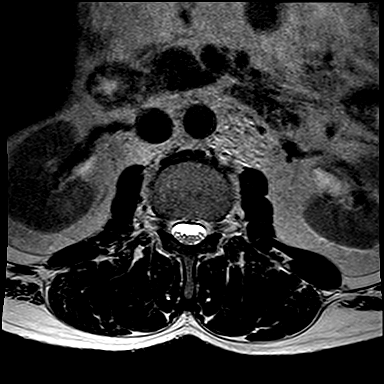
[im 23/23]
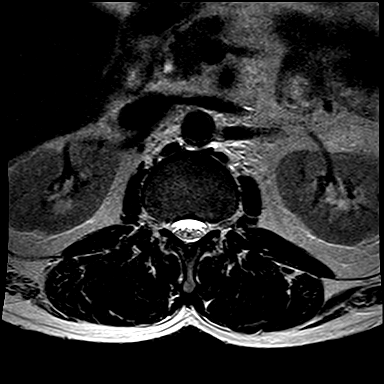

[29 of 48 positions shown; findings below may reference images not displayed]

FINDINGS: No acute or focal bone changes of lumbar vertebrae are seen.

Conus medullaris and cauda equina are unremarkable.

At L1-2 level, no focal disc lesions are seen. 

At L2-3 level, mild degenerative disc disease with bulging annulus and facet arthropathy are noted causing mild compromise of thecal sac and both lateral recesses. AP diameter of thecal sac in the midline measures 9.1 mm.

At L3-4 level, significant degenerative disc disease with bulging annulus is noted.  Annulus tear of the right posterior lateral aspect at the foraminal level is noted. Bulging annulus and facet arthropathy are causing moderate compromise of thecal sac and both lateral recesses and neural foramina. AP diameter of thecal sac in the midline measures 7.9 mm.

At L4-5 level, severe degenerative disc disease is noted with asymmetric bulging annulus to the left with facet arthropathy causing severe left foraminal stenosis and compromise of left lateral recess. Significant right foraminal stenosis and significant compromise of thecal sac at this level with AP diameter in the midline measuring 6.5 mm.

At L5-S1 level, no focal disc lesions are seen.

Questionable abnormal signal in the subarticular aspect of the femoral head at the right hip is noted in the partially visualized pelvis.  If patient has significant hip symptoms, additional evaluation with hip MRI can be considered.
IMPRESSION: 1.  No acute bone changes of lumbar vertebrae are seen.  Transitional lumbosacral junction is noted with lowest visible disc in the sagittal images labeled as L5-S1 for the purposes of this report.

2. At L4-5 level, severe degenerative disc disease is noted with asymmetric bulging annulus to the left with facet arthropathy causing severe left foraminal stenosis and compromise of left lateral recess. Significant right foraminal stenosis and significant compromise of thecal sac at this level with AP diameter in the midline measuring 6.5 mm.

3. At L3-4 level, significant degenerative disc disease with bulging annulus is noted.  Annulus tear of the right posterior lateral aspect at the foraminal level is noted. Bulging annulus and facet arthropathy are causing moderate compromise of thecal sac and both lateral recess and neural foramina. AP diameter of thecal sac in the midline measures 7.9 mm.

4. Findings at other disc levels are described above in detail.  

5. Questionable abnormal signal in the subarticular aspect of the femoral head at the right hip is noted in the partially visualized pelvis.  If patient has significant hip symptoms, additional evaluation with hip MRI can be considered.

## 2022-05-02 DIAGNOSIS — G8929 Other chronic pain: Secondary | ICD-10-CM | POA: Insufficient documentation

## 2022-05-02 DIAGNOSIS — M5116 Intervertebral disc disorders with radiculopathy, lumbar region: Secondary | ICD-10-CM | POA: Insufficient documentation

## 2022-05-02 DIAGNOSIS — M545 Low back pain, unspecified: Secondary | ICD-10-CM | POA: Insufficient documentation

## 2022-05-27 ENCOUNTER — Ambulatory Visit (HOSPITAL_COMMUNITY): Payer: Self-pay

## 2022-05-27 ENCOUNTER — Encounter (HOSPITAL_COMMUNITY): Payer: Self-pay

## 2022-05-30 ENCOUNTER — Other Ambulatory Visit (HOSPITAL_COMMUNITY): Payer: Self-pay | Admitting: Family Medicine

## 2022-05-30 ENCOUNTER — Other Ambulatory Visit: Payer: Self-pay

## 2022-05-30 ENCOUNTER — Ambulatory Visit
Admission: RE | Admit: 2022-05-30 | Discharge: 2022-05-30 | Disposition: A | Discharge status: Home / Self Care | Payer: Medicaid Other | Source: Ambulatory Visit | Attending: Family Medicine | Admitting: Family Medicine

## 2022-05-30 DIAGNOSIS — R109 Unspecified abdominal pain: Secondary | ICD-10-CM | POA: Insufficient documentation

## 2022-05-31 ENCOUNTER — Emergency Department
Admission: EM | Admit: 2022-05-31 | Discharge: 2022-05-31 | Disposition: A | Discharge status: Home / Self Care | Payer: Medicaid Other | Attending: FAMILY PRACTICE | Admitting: FAMILY PRACTICE

## 2022-05-31 ENCOUNTER — Emergency Department (HOSPITAL_BASED_OUTPATIENT_CLINIC_OR_DEPARTMENT_OTHER): Payer: Medicaid Other

## 2022-05-31 ENCOUNTER — Encounter (HOSPITAL_BASED_OUTPATIENT_CLINIC_OR_DEPARTMENT_OTHER): Payer: Self-pay

## 2022-05-31 DIAGNOSIS — K644 Residual hemorrhoidal skin tags: Secondary | ICD-10-CM | POA: Insufficient documentation

## 2022-05-31 DIAGNOSIS — J449 Chronic obstructive pulmonary disease, unspecified: Secondary | ICD-10-CM | POA: Insufficient documentation

## 2022-05-31 DIAGNOSIS — R718 Other abnormality of red blood cells: Secondary | ICD-10-CM | POA: Insufficient documentation

## 2022-05-31 DIAGNOSIS — F17210 Nicotine dependence, cigarettes, uncomplicated: Secondary | ICD-10-CM | POA: Insufficient documentation

## 2022-05-31 DIAGNOSIS — K5190 Ulcerative colitis, unspecified, without complications: Secondary | ICD-10-CM

## 2022-05-31 DIAGNOSIS — E876 Hypokalemia: Secondary | ICD-10-CM | POA: Insufficient documentation

## 2022-05-31 DIAGNOSIS — Z8719 Personal history of other diseases of the digestive system: Secondary | ICD-10-CM | POA: Insufficient documentation

## 2022-05-31 LAB — COMPREHENSIVE METABOLIC PANEL, NON-FASTING
ALBUMIN/GLOBULIN RATIO: 0.8 (ref 0.8–1.4)
ALBUMIN: 3.5 g/dL (ref 3.4–5.0)
ALKALINE PHOSPHATASE: 149 U/L — ABNORMAL HIGH (ref 46–116)
ALT (SGPT): 65 U/L (ref <=78)
ANION GAP: 10 mmol/L (ref 4–13)
AST (SGOT): 116 U/L — ABNORMAL HIGH (ref 15–37)
BILIRUBIN TOTAL: 0.8 mg/dL (ref 0.2–1.0)
BUN/CREA RATIO: 7
BUN: 6 mg/dL — ABNORMAL LOW (ref 7–18)
CALCIUM, CORRECTED: 9.7 mg/dL
CALCIUM: 9.3 mg/dL (ref 8.5–10.1)
CHLORIDE: 97 mmol/L — ABNORMAL LOW (ref 98–107)
CO2 TOTAL: 28 mmol/L (ref 21–32)
CREATININE: 0.83 mg/dL (ref 0.70–1.30)
ESTIMATED GFR: 111 mL/min/{1.73_m2} (ref >59)
GLOBULIN: 4.5
GLUCOSE: 97 mg/dL (ref 74–106)
OSMOLALITY, CALCULATED: 268 mOsm/kg — ABNORMAL LOW (ref 270–290)
POTASSIUM: 3.3 mmol/L — ABNORMAL LOW (ref 3.5–5.1)
PROTEIN TOTAL: 8 g/dL (ref 6.4–8.2)
SODIUM: 135 mmol/L — ABNORMAL LOW (ref 136–145)

## 2022-05-31 LAB — MANUAL DIFFERENTIAL
EOSINOPHIL %: 3 % (ref 0–7)
EOSINOPHIL ABSOLUTE: 0.29 10*3/uL (ref 0.00–0.80)
EOSINOPHILS MANUAL: 3
LYMPHOCYTE %: 32 % (ref 25–45)
LYMPHOCYTE ABSOLUTE: 3.14 10*3/uL (ref 1.10–5.00)
LYMPHOCYTES MANUAL: 32
MONOCYTE %: 4 % (ref 0–12)
MONOCYTE ABSOLUTE: 0.39 10*3/uL (ref 0.00–1.30)
MONOCYTES MANUAL: 4
NEUTROPHIL %: 61 % (ref 40–76)
NEUTROPHIL ABSOLUTE: 5.98 10*3/uL (ref 1.80–8.40)
NEUTROPHILS MANUAL: 61
PLATELET MORPHOLOGY COMMENT: ADEQUATE
TOTAL CELLS COUNTED [#] IN BLOOD: 100
WBC: 9.8 10*3/uL

## 2022-05-31 LAB — CBC WITH DIFF
HCT: 54.9 % — ABNORMAL HIGH (ref 42.0–51.0)
HGB: 17.6 g/dL (ref 13.5–18.0)
MCH: 33.4 pg — ABNORMAL HIGH (ref 27.0–32.0)
MCHC: 32.1 g/dL (ref 32.0–36.0)
MCV: 104.2 fL — ABNORMAL HIGH (ref 78.0–99.0)
MPV: 7.1 fL — ABNORMAL LOW (ref 7.4–10.4)
PLATELETS: 367 10*3/uL (ref 140–440)
RBC: 5.27 10*6/uL (ref 4.20–6.00)
RDW: 19 % — ABNORMAL HIGH (ref 11.6–14.8)
WBC: 9.8 10*3/uL (ref 4.0–10.5)

## 2022-05-31 LAB — LACTIC ACID LEVEL W/ REFLEX FOR LEVEL >2.0: LACTIC ACID: 2.5 mmol/L — ABNORMAL HIGH (ref 0.4–2.0)

## 2022-05-31 LAB — OCCULT BLOOD (PRN ED USE ONLY): OCCULT BLOOD: POSITIVE — AB

## 2022-05-31 LAB — LACTIC ACID - FIRST REFLEX: LACTIC ACID: 1.2 mmol/L (ref 0.4–2.0)

## 2022-05-31 LAB — MAGNESIUM: MAGNESIUM: 1.7 mg/dL — ABNORMAL LOW (ref 1.8–2.4)

## 2022-05-31 LAB — LIPASE: LIPASE: 8 U/L — ABNORMAL LOW (ref 11–82)

## 2022-05-31 MED ORDER — POTASSIUM CHLORIDE ER 20 MEQ TABLET,EXTENDED RELEASE(PART/CRYST)
40.0000 meq | ORAL_TABLET | ORAL | Status: AC
Start: 2022-05-31 — End: 2022-05-31
  Administered 2022-05-31: 40 meq via ORAL

## 2022-05-31 MED ORDER — PREDNISONE 20 MG TABLET
40.0000 mg | ORAL_TABLET | Freq: Every day | ORAL | 0 refills | Status: AC
Start: 2022-05-31 — End: 2022-06-07

## 2022-05-31 MED ORDER — METHYLPREDNISOLONE SOD SUCC 125 MG SOLUTION FOR INJECTION WRAPPER
INTRAVENOUS | Status: AC
Start: 2022-05-31 — End: 2022-05-31
  Filled 2022-05-31: qty 2

## 2022-05-31 MED ORDER — LACTATED RINGERS IV BOLUS
2000.0000 mL | INJECTION | Status: AC
Start: 2022-05-31 — End: 2022-05-31
  Administered 2022-05-31: 2000 mL via INTRAVENOUS

## 2022-05-31 MED ORDER — METHYLPREDNISOLONE SOD SUCC 125 MG SOLUTION FOR INJECTION WRAPPER
125.0000 mg | INTRAVENOUS | Status: AC
Start: 2022-05-31 — End: 2022-05-31
  Administered 2022-05-31: 125 mg via INTRAVENOUS

## 2022-05-31 MED ORDER — POTASSIUM CHLORIDE ER 20 MEQ TABLET,EXTENDED RELEASE(PART/CRYST)
ORAL_TABLET | ORAL | Status: AC
Start: 2022-05-31 — End: 2022-05-31
  Filled 2022-05-31: qty 2

## 2022-05-31 NOTE — ED Provider Notes (Addendum)
[facility] Emergency Department  ED Primary Provider Note  History of Present Illness   Chief Complaint   Patient presents with   . Abdominal Pain     [name] is a 46 y.o. male who had concerns including Abdominal Pain.  Arrival: The patient arrived by Car    This 46 year old male patient presents emergency department with nausea and abdominal cramping for about 4 days, bilious vomiting yesterday with the 1st episode having a small amount of blood in it, and continued bilious vomiting throughout the evening and a couple of episodes today.  Does have a history of Barrett's esophagus, and ulcerative colitis.  He was had several EGDs he is had 2 colonoscopies within the last year.  He was sent here from his primary care office, where they evaluated him yesterday and blood work is not available to [place], however, the flat and upright abdomen did show a mild ileus.  He states he is passed a little bit of gas, has had diarrhea for the last few days but no noted blood in the diarrhea.  Does have some cough and wheeze, no dyspnea, chest pain, pressure, heaviness, palpitations or tachycardia.  He is had no fever, sweats, chills.  He does drink "a few beers" in the evenings, does smoke about a pack of cigarettes per day.  He denies marijuana or illicit drug use.  Past medical history significant for Barrett's esophagus,      History Reviewed This Encounter:  Patient's past medical, surgical, social history reviewed noted.    Physical Exam   ED Triage Vitals   BP (Non-Invasive) 05/31/22 1430 (!) 135/94   Heart Rate 05/31/22 1445 79   Respiratory Rate 05/31/22 1445 16   Temp --    SpO2 05/31/22 1530 94 %   Weight --    Height --      Physical Exam  General:  No acute distress nontoxic does look uncomfortable.  Ears: TMs clear throat for traction  Nasal:  Injected mucosa, patent bilaterally small amount of mucus.    Oral:  Pink moist oropharynx: Mild injection without PND,  exudate, petechiae   Neck:  Supple, trachea is in midline.    Lungs:  Slightly distant symmetrical with faint expiratory wheezes and rhonchi throughout.    Heart: Regular rate rhythm without murmur gallop, distant   Abdomen: Soft, normal bowel sounds, mild epigastric right and left upper quadrant tenderness without guarding rebound or referred pain   Rectal:  Small external hemorrhoids, no masses and digital reach, brown stool.    Extremities:  Moving symmetrically   Skin:  No suspicious rashes or lesions, no edema.    Neurological:  No focal deficits.    Patient Data     Labs Ordered/Reviewed   OCCULT BLOOD (PRN ED USE ONLY) - Abnormal; Notable for the following components:       Result Value    OCCULT BLOOD Positive (*)     All other components within normal limits   COMPREHENSIVE METABOLIC PANEL, NON-FASTING - Abnormal; Notable for the following components:    SODIUM 135 (*)     POTASSIUM 3.3 (*)     CHLORIDE 97 (*)     BUN 6 (*)     ALKALINE PHOSPHATASE 149 (*)     AST (SGOT) 116 (*)     OSMOLALITY, CALCULATED 268 (*)     All other components within normal limits    Narrative:     Estimated  Glomerular Filtration Rate (eGFR) is calculated using the CKD-EPI (2021) equation, intended for patients 61 years of age and older. If gender is not documented or "unknown", there will be no eGFR calculation.   LACTIC ACID LEVEL W/ REFLEX FOR LEVEL >2.0 - Abnormal; Notable for the following components:    LACTIC ACID 2.5 (*)     All other components within normal limits   CBC WITH DIFF - Abnormal; Notable for the following components:    HCT 54.9 (*)     MCV 104.2 (*)     MCH 33.4 (*)     RDW 19.0 (*)     MPV 7.1 (*)     All other components within normal limits   MAGNESIUM - Abnormal; Notable for the following components:    MAGNESIUM 1.7 (*)     All other components within normal limits   LACTIC ACID - FIRST REFLEX - Normal   CBC/DIFF    Narrative:     The following orders were created for panel order CBC/DIFF.  Procedure                                Abnormality         Status                     ---------                               -----------         ------                     CBC WITH DIFF[[id number]]                Abnormal            Final result               MANUAL DIFFERENTIAL[[id number]]                              Final result                 Please view results for these tests on the individual orders.   MANUAL DIFFERENTIAL   LIPASE     CT ABDOMEN PELVIS WO IV CONTRAST   Final Result by Edi, Radresults In (12/12 1524)   1. Bladder wall thickening probably related to suboptimal distention. No acute inflammatory changes or bowel obstruction are identified          One or more dose reduction techniques were used (e.g., Automated exposure control, adjustment of the mA and/or kV according to patient size, use of iterative reconstruction technique).         Radiologist location ID: [place] Decision Making        Medical Decision Making  High complexity straightforward differential includes exacerbation COPD, exacerbation of ulcerative colitis, GERD, esophagitis, tobacco abuse, hypokalemia    Amount and/or Complexity of Data Reviewed  Labs: ordered.     Details: Labs noted and reviewed  Radiology: ordered.     Details: CT reviewed and noted    Risk  Prescription drug management.      ED Course as of 05/31/22 1826   Tue May 31, 2022  1610 Magnesium ordered to assess for the low potassium, CT reviewed no acute intermittent abdominal process.  CBC CMP noted, lactic acid is 2.5, will be repeated after fluid bolus as infused.  He was microcytosis with his increased H&H.   1758 Patient's repeat lactic acid is 1.2. Will discharge to home   1825 Patient complaining of abdominal pain, better than yesterday but still present.  Potassium minimally low at 3.3, did receive 2000 cc of LR.  We will give him Solu-Medrol for pain, and prednisone, can discharge home follow-up with GI, to call them in the morning.          Medications Administered in the ED   LR bolus infusion 2,000 mL (2,000 mL Intravenous New Bag/New Syringe 05/31/22 1448)   potassium chloride (K-DUR) extended release tablet (40 mEq Oral Given 05/31/22 1606)     Clinical Impression   Hypokalemia (Primary)   History of ulcerative colitis   Chronic obstructive pulmonary disease, unspecified COPD type (CMS HCC)       Disposition: Discharged      .Marland KitchenBretta Bang, DO

## 2022-05-31 NOTE — ED Triage Notes (Addendum)
Yesterday I puked up yellow bile all day and went to family doctor and got labs and went to [place] for xray and they didn't like the results got back and wanted me to come here to have CT to see if I need surgery. No vomiting today was given antibotics and something for nausea.

## 2022-05-31 NOTE — Discharge Instructions (Addendum)
You have microcytic anemia from your chronic disease, however, your hemoglobin and hematocrit are elevated greatly due to your COPD most likely., continue home medications and your CPAP.    You need to call tomorrow for follow-up with your gastroenterologist and with your primary care provider regarding your ER visit, abdominal pain and microcytosis.    Fair a pulmonologist you do also need to call and follow-up with your pulmonary medicine provider as well.    Stop smoking, there are available medications and most states have smoking cessation programs that will pay for medication and other means of smoking cessation.    Return to the ER for re-evaluation should you get any fever, severe pain or changes in condition that maybe concerning.

## 2022-06-02 ENCOUNTER — Other Ambulatory Visit (HOSPITAL_COMMUNITY): Payer: Self-pay | Admitting: Family

## 2022-06-02 DIAGNOSIS — R112 Nausea with vomiting, unspecified: Secondary | ICD-10-CM

## 2022-06-24 ENCOUNTER — Ambulatory Visit (HOSPITAL_COMMUNITY): Payer: Self-pay

## 2022-06-27 ENCOUNTER — Ambulatory Visit (HOSPITAL_COMMUNITY)
Admission: RE | Admit: 2022-06-27 | Discharge: 2022-06-27 | Disposition: A | Discharge status: Still In Hospital | Payer: Worker's Compensation | Source: Ambulatory Visit

## 2022-06-27 ENCOUNTER — Ambulatory Visit
Admission: RE | Admit: 2022-06-27 | Discharge: 2022-06-27 | Disposition: A | Discharge status: Home / Self Care | Payer: Medicaid Other | Source: Ambulatory Visit | Attending: Family | Admitting: Family

## 2022-06-27 ENCOUNTER — Other Ambulatory Visit: Payer: Self-pay

## 2022-06-27 DIAGNOSIS — R112 Nausea with vomiting, unspecified: Secondary | ICD-10-CM | POA: Insufficient documentation

## 2022-06-27 DIAGNOSIS — M5116 Intervertebral disc disorders with radiculopathy, lumbar region: Secondary | ICD-10-CM | POA: Insufficient documentation

## 2022-06-27 DIAGNOSIS — R109 Unspecified abdominal pain: Secondary | ICD-10-CM

## 2022-06-27 MED ORDER — SINCALIDE 5 MCG SOLUTION FOR INJECTION
INTRAMUSCULAR | Status: AC
Start: 2022-06-27 — End: 2022-06-27
  Filled 2022-06-27: qty 5

## 2022-06-27 MED ORDER — SINCALIDE 5 MCG SOLUTION FOR INJECTION
1.7000 ug | INTRAMUSCULAR | Status: DC
Start: 2022-06-27 — End: 2022-06-28

## 2022-06-27 NOTE — PT Evaluation (Signed)
[facility]  Outpatient Physical Therapy  [place], [address]  [phone]  [phone]      Physical Therapy Lumbar Evaluation    Date: 06/27/2022  Patient's Name: [name]  Date of Birth: January 05, 1976    PT diagnosis/Reason for Referral: Low back pain (lumbar disc disorder, Left L4-5 herniated disc, L5 radiculopathy.)             SUBJECTIVE  Date of onset: Patient presents to PT clinic very short with answers stating that he's had back pain for years but it recently worsened with work comp injury. Patient was unable to recall specific date of when injury happened and when questioned couldn't even give a time frame or month of work comp injury. Pt reports the day of injury he was on a storm call [place] he [place] on a stump, landed on his back and was unable to get up. Patient notes he had an MRI which told him he had a break in the disc, pinched nerve on the L and some bad disc disorder in his lumbar region. Patient reports N/T down L LE all the way to his foot constantly with various intensities. Patient notes that his L LE does give way on him intermittently. Patient notes he has a very high pain tolerance and repeated this many times throughout evaluation. Patient states that he has had therapy before and it didn't help. Per pt he feels as if therapy is only for post surgical and that PT is not going to help with his problem. Patient states he's a dad of young kids and would like to be a father to them and needs to work. 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 Patient notes that he would like to avoid surgery at this time but doesn't feel PT will help either. Pt notes he is unable to take time off work right now so he is only able to come in Friday's on his day off as he cannot lose pay.      Mechanism of injury: Slip at work  with fall and pain to low back.     Medications for this problem:  Advil liquid gels (PRN) doesn't feel much different.    Diagnostic tests: Imaging not available  to PT, referral states Left L4-5 HNP, left L5 radiculopathy with lumbar disc disorder.    Patient goals: REDUCE PAIN, RETURN TO WORK, and NORMALIZE FUNCTION,be a father to his kids, return to hiking, running, etc.    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     Pain location: Central low back (L > R) extending down L LE into L foot.                 Pain description: SHARP, STABBING, SHOOTING, BURNING, and TINGLING    Pain frequency:  CONTINUOUS and with various intensities.    Pain rating: Now 4-5/10   Best 5/10   Worst 8/10    Radiculopathy: Yes down L LE into the foot    Pain increases with: ACTIVITY, EXERCISE, STAND, SIT, WALK, BENDING, and LIFTING           decreases with : REST    Sleep affected: Worsened since injury.    Bowel/bladder problems: No-     Subjective Functional Reports:  Sitting: LIMITED and <5 minutes    Standing: LIMITED and <5 Minutes    Walking: LIMITED and <5 minutes before needing to rest.    Lifting: LIMITED /Light work duty          OBJECTIVE    Patient-Specific Functional Score:    Problem Score (06/27/22)   1. Full work duty 0   2. Hiking/Running 0   3. Play with his kids restriction free 0   Total 0        AROM   right left   Flexion 60     Extension 0 (all motion from thoracic/TL junction)    Sidebend 10 10     ROM comments Pt continues to repeat to PT after lumbar flexion assessment that if he continues to reach for his toes after the 3rd time he will have a bunch of cracking/popping and be able to touch his toes. Poor movement sequencing with lumbar flexion, minimal hip hinge initiating movement in thoracic/TJ junction.    Strength comments Unable to assess as pt deferred laying supine and was only agreeable to prone or side lying.    Palpation:  Tenderness along L5 B (L >R) no tenderness elicited otherwise.      Posture:  Pt sits with lateral shift to the L forward head posture and rounded shoulders.     Gait:  Trendelenburg noted during L stance phase of gait with trunk shift over L, decreased hip extension, decreased arm swing B with antalgic gait noted.    Reflexes    Patellar Normal B   Achilles Normal B     Special tests:   Prone prop on 1 pillow: Centralization of symptoms to low back  Prone press up on 1 pillow: Centralization of symptoms.      Treatment provided:REVIEW OF POC AND GOALS WITH PATIENT, ALL QUESTIONS ANSWERED, PATIENT EDUCATION, and THERAPEUTIC EXERCISE . Extensive education on use of positioning to achieve centralization of peripheral symptoms, importance of flexibility/mobility for the kinetic chain, proximal stability for lumbar support. Pt verbalized understanding but continued to express that he doesn't feel PT will help as it hasn't previously and he feels its for post op individuals.    Access Code: [email or web address]  URL: [email or web address]  Date: 06/27/2022  Prepared by: Jonell Cluck    Exercises  - Prone Press Up On Elbows  - 1-2 x daily - 7 x weekly - 1-2 sets - 8-10 reps  - Static Prone on Elbows  - 1-3 x daily - 7 x weekly - 1-2 minutes hold          ASSESSMENT    Impression: Patient is a 47 y/o referred to PT services for herniated disc L4-L5 with L5 radiculopathy. Patient presents to PT with very firm belief that PT couple with fear avoidance behaviors and the pre contemplation phase of change. Patient declined supine position that PT requested in order to get the whole picture of what is going on with the patient. PT was able to get patient to lay prone which centralized his L LE symptoms with placement of 1 pillow under his hip. Patient also had centralization from prone press up on 1 pillow. Extensive education on centralization of symptoms and the use of managing herniated discs following by course of PT. Pt  presents with lateral shift to the L in sitting, trendelenburg with gait and notable proximal weakness noted with transitional movements. Pt notes that he contemplated wearing a lifting belt and PT educated pt what that  compression proximal simulates and that's stability and strength proximally in the core and lumbopelvic region. Pt to be seen 1-2x/wk for 10 weeks for conservative TX of L4-5 disc herniation as well as lumbar radiculopathy in order to allow pt to return to full work duties.     Rehab potential: FAIR and GUARDED based on pre-contemplation phase of change as well as belief towards PT.      Short Term Goals: 5 Weeks   1. Patient to decrease low back pain to <3/10 in order to improve sleep quality   2. Patient to tolerate ambulation >10 minutes without increase in low back pain   3. Patient to be compliant with HEP and progressions.   4. Patient to tolerate supine laying in order to initiate neutral spine and core stabilization for lumbopelvic region.     Long Term Goals: 10 Weeks   1. Patient to respond to extension biased exercises abolishing radicular symptoms   2. Patient to demonstrate proper lifting ergonomics for return to occupational demands without increase in pain   3. Patient to return to community ambulation not limited by back or radicular symptoms.    4. Patient to respond to conservative tx avoiding need for surgical intervention     PLAN  Patient will attend 1-2 times per week x 10 weeks. Therapy may include, but is not limited to THERAPEUTIC EXERCISES, MYOFASCIAL/JOINT MOBILIZATION, POSTURE/BODY MECHANICS, ERGONOMIC TRAINING, TRANSFER/GAIT TRAINING, HOME INSTRUCTIONS, ULTRASOUND, ELECTRICAL STIMULATION, KINESIOTAPE, MECHANICAL TRACTION, and [place] for next visit Review prone propping and response. Continue to educate patient on PT role and conservative tx. Pt is 1x/wk per his request due to not being able to take work off. Establishing HEP for home compliance is  very important if pt is wanting to avoid surgical intervention.       Evaluation complexity:   Personal factors impacting POC: OCCUPATIONAL ADLS (IE HEAVY LIFTING, REPETITIVE TASKS, LONG HOURS)   Co-morbidities impacting POC: PREVIOUS SURGERIES and abdominal surgeries  Complexity of physical exam: INCLUDING MUSCULOSKELETAL SYSTEM (POSTURE, ROM, STRENGTH, HEIGHT/WEIGHT), INCLUDING NEUROMUSCULAR EXAM (BALANCE, GAIT, LOCOMOTION, MOBILITY), and INCLUDING INTEGUMENTARY EXAM (TISSUE PLIABILITY, SCAR TISSUE, SKIN COLOR)   Clinical Presentation: EVOLVING WITH CHANGING CLINICAL CHARACTERISTICS   Evaluation Complexity: MODERATE-HISTORY 1-2, EXAMINATION 3+, PRESENTATION  EVOLVING/CHANGING        Total Session Time 40, Timed code minutes 10, and Untimed code minutes 30        Intervention minutes: EVALUATION 30 minutes and THERAPEUTIC EXERCISE 10 minutes    Jonell Cluck, PT  06/27/2022, 14:49              Start of Service: _________          Certification:    From:______  Through:_________    I certify the need for these services furnished under this plan of treatment and while under my care.    Referring Provider Signature: _______________     Date : _____________________    Printed name of Referring Provider: ___________________________________________

## 2022-06-28 ENCOUNTER — Other Ambulatory Visit (HOSPITAL_COMMUNITY): Payer: Self-pay | Admitting: Neurological Surgery

## 2022-06-28 DIAGNOSIS — M5116 Intervertebral disc disorders with radiculopathy, lumbar region: Secondary | ICD-10-CM

## 2022-07-01 ENCOUNTER — Ambulatory Visit (HOSPITAL_COMMUNITY): Payer: Self-pay

## 2022-07-01 ENCOUNTER — Ambulatory Visit (HOSPITAL_COMMUNITY)
Admission: RE | Admit: 2022-07-01 | Discharge: 2022-07-01 | Disposition: A | Discharge status: Still In Hospital | Payer: Worker's Compensation | Source: Ambulatory Visit

## 2022-07-01 ENCOUNTER — Other Ambulatory Visit: Payer: Self-pay

## 2022-07-01 NOTE — PT Treatment (Addendum)
[facility]  Outpatient Physical Therapy  [place], [address]  [phone]  [phone]    Physical Therapy Treatment Note    Date: 07/01/2022  Patient's Name: [name]  Date of Birth: Apr 23, 1976            Visit #/POC:2 of 77- 76  Authorization:2 of 51  POC Signed?: No  POC Ends: 3/18  Next Progress Note Due: 2/8      Evaluating Physical Therapist: Jonell Cluck, PT  PT diagnosis/Reason for Referral:  Low back pain (lumbar disc disorder, Left L4-5 herniated disc, L5 radiculopathy.)   Next Scheduled Physician Appointment: TBD  Allergies/Contraindications:           Subjective: States that that his back pain is 5/10. States that if he sits/stands stationary for any amount of time his pain will increase to 7-8/10.    Objective: Treatment delivered as following:    Measured ROM: AROM 1/8    right left   Flexion 60      Extension 0 (all motion from thoracic/TL junction)     Sidebend 10 10     EXERCISE/ACTIVITY NAME REPETITIONS RESISTANCE COMPLETED THIS DOS       Prone sacral floats (on elbows) 5 min  y   Ab bracing with march  x10  y   bridging x10  y   Lumbar ext with PB x15  y   Clamshell hooklying x10 RTB y   Ab bracing x10  y   Side glides x10  y   Thoracic PA segmental mobs   y           Access Code: [email or web address]  URL: [email or web address]  Date: 07/01/2022  Prepared by: Carlton Adam    Exercises  - Prone Press Up On Elbows  - 1 x daily - 7 x weekly - 3 sets - 10 reps    Assessment: Noted limited lumbar extension. Noted centralization with lumbar extension. Reports decrease tightness following treatment. Patient was able to demonstrate and verbalize proper technique with HEP.     Short Term Goals: 5 Weeks              1. Patient to decrease low back pain to <3/10 in order to improve sleep quality              2. Patient to tolerate ambulation >10 minutes without increase in low back pain              3. Patient to be compliant with HEP  and progressions.              4. Patient to tolerate supine laying in order to initiate neutral spine and core stabilization for lumbopelvic region.                Long Term Goals: 10 Weeks              1. Patient to respond to extension biased exercises abolishing radicular symptoms              2. Patient to demonstrate proper lifting ergonomics for return to occupational demands without increase in pain              3. Patient to return to community ambulation not limited by back or radicular symptoms.               4. Patient to respond to conservative tx avoiding need for surgical intervention  Plan: Continue strengthening and mobility therex. Progress as tolerated. Assess response to treatment.     Total Session Time 37 and Timed code minutes 37  THERAPEUTIC EXERCISE 37 minutes      Carlton Adam, PTA  07/01/2022, 10:10

## 2022-07-08 ENCOUNTER — Ambulatory Visit
Admission: RE | Admit: 2022-07-08 | Discharge: 2022-07-08 | Disposition: A | Discharge status: Still In Hospital | Payer: Worker's Compensation | Source: Ambulatory Visit | Attending: Neurological Surgery | Admitting: Neurological Surgery

## 2022-07-08 ENCOUNTER — Other Ambulatory Visit: Payer: Self-pay

## 2022-07-08 NOTE — PT Treatment (Addendum)
[facility]  Outpatient Physical Therapy  [place], [address]  [phone]  [phone]    Physical Therapy Discharge Note    Date: 07/08/2022  Patient's Name: [name]  Date of Birth: 1975-10-18          **Pt no showed appointment, per pt the referring provider told her to stop therapy.**     Visit #/POC:3 of 10- 20  Authorization:2 of 12  POC Signed?: No  POC Ends: 3/18  Next Progress Note Due: 2/8        Evaluating Physical Therapist: Jonell Cluck, PT  PT diagnosis/Reason for Referral:  Low back pain (lumbar disc disorder, Left L4-5 herniated disc, L5 radiculopathy.)   Next Scheduled Physician Appointment: 07/11/22  Allergies/Contraindications:               Subjective: States that he had an appt with the surgeon today, but where the [place] called a state of emergency all out patient facilities were closed. Re-scheduled him for Monday Jan 22, 20204. States that he has been having coughing spells since Thursday, but will do what he cn today. Patient states that his back is hurting pretty bad today.       Objective: Treatment delivered as following:     Measured ROM: AROM 1/8    right left   Flexion 60      Extension 0 (all motion from thoracic/TL junction)     Sidebend 10 10      EXERCISE/ACTIVITY NAME REPETITIONS RESISTANCE COMPLETED THIS DOS         Prone sacral floats (on elbows) 5 min   y   Ab bracing with march  x10   y   bridging x33   y   Lumbar ext with PB  Thoracic ext with PB X15  x15   Y  y   Clamshell hooklying x15 RTB y   Ab bracing x10   y   Side glides x10   y   Thoracic PA segmental mobs     y                Assessment: Noted patient continues to have centralization with lumbar extension. Noted core weakness and tactile cueing to engage core required. No adverse effects noted from treatment.      Short Term Goals: 5 Weeks              1. Patient to decrease low back pain to <3/10 in order to improve sleep quality               2. Patient to tolerate ambulation >10 minutes without increase in low back pain              3. Patient to be compliant with HEP and progressions.              4. Patient to tolerate supine laying in order to initiate neutral spine and core stabilization for lumbopelvic region.                Long Term Goals: 10 Weeks              1. Patient to respond to extension biased exercises abolishing radicular symptoms              2. Patient to demonstrate proper lifting ergonomics for return to occupational demands without increase in pain  3. Patient to return to community ambulation not limited by back or radicular symptoms.               4. Patient to respond to conservative tx avoiding need for surgical intervention     Plan: Discharge per referring providers orders.          Total Session Time 30 and Timed code minutes 30  THERAPEUTIC EXERCISE 30 minutes      Carlton Adam, PTA  07/08/2022, 09:33  Jonell Cluck, PT 07/27/2022 15:03

## 2022-07-15 ENCOUNTER — Ambulatory Visit (HOSPITAL_COMMUNITY): Payer: Self-pay

## 2022-08-08 ENCOUNTER — Other Ambulatory Visit (HOSPITAL_COMMUNITY): Payer: Self-pay | Admitting: Family Medicine

## 2022-08-08 ENCOUNTER — Ambulatory Visit
Admission: RE | Admit: 2022-08-08 | Discharge: 2022-08-08 | Disposition: A | Discharge status: Home / Self Care | Payer: Medicaid Other | Source: Ambulatory Visit | Attending: Family Medicine | Admitting: Family Medicine

## 2022-08-08 ENCOUNTER — Other Ambulatory Visit: Payer: Self-pay

## 2022-08-08 DIAGNOSIS — R059 Cough, unspecified: Secondary | ICD-10-CM

## 2022-08-22 ENCOUNTER — Other Ambulatory Visit: Payer: Self-pay

## 2022-08-22 ENCOUNTER — Emergency Department
Admission: EM | Admit: 2022-08-22 | Discharge: 2022-08-22 | Disposition: A | Discharge status: Home / Self Care | Payer: Medicaid Other | Attending: Emergency Medicine | Admitting: Emergency Medicine

## 2022-08-22 ENCOUNTER — Encounter (HOSPITAL_COMMUNITY): Payer: Self-pay

## 2022-08-22 ENCOUNTER — Emergency Department (HOSPITAL_COMMUNITY): Payer: Medicaid Other

## 2022-08-22 DIAGNOSIS — K760 Fatty (change of) liver, not elsewhere classified: Secondary | ICD-10-CM | POA: Insufficient documentation

## 2022-08-22 DIAGNOSIS — M79604 Pain in right leg: Secondary | ICD-10-CM | POA: Insufficient documentation

## 2022-08-22 DIAGNOSIS — E539 Vitamin B deficiency, unspecified: Secondary | ICD-10-CM | POA: Insufficient documentation

## 2022-08-22 DIAGNOSIS — E876 Hypokalemia: Secondary | ICD-10-CM | POA: Insufficient documentation

## 2022-08-22 DIAGNOSIS — M79605 Pain in left leg: Secondary | ICD-10-CM | POA: Insufficient documentation

## 2022-08-22 LAB — CBC WITH DIFF
BASOPHIL #: 0.1 10*3/uL (ref 0.00–0.10)
BASOPHIL %: 1 % (ref 0–1)
EOSINOPHIL #: 0.2 10*3/uL (ref 0.00–0.50)
EOSINOPHIL %: 2 %
HCT: 46.1 % (ref 36.7–47.1)
HGB: 15.6 g/dL (ref 12.5–16.3)
LYMPHOCYTE #: 2.4 10*3/uL (ref 1.00–3.00)
LYMPHOCYTE %: 18 % (ref 16–44)
MCH: 34.8 pg — ABNORMAL HIGH (ref 23.8–33.4)
MCHC: 33.8 g/dL (ref 32.5–36.3)
MCV: 103.1 fL — ABNORMAL HIGH (ref 73.0–96.2)
MONOCYTE #: 1.5 10*3/uL — ABNORMAL HIGH (ref 0.30–1.00)
MONOCYTE %: 11 % (ref 5–13)
MPV: 7.1 fL — ABNORMAL LOW (ref 7.4–11.4)
NEUTROPHIL #: 9.1 10*3/uL — ABNORMAL HIGH (ref 1.85–7.80)
NEUTROPHIL %: 69 % (ref 43–77)
PLATELETS: 293 10*3/uL (ref 140–440)
RBC: 4.47 10*6/uL (ref 4.06–5.63)
RDW: 17.5 % — ABNORMAL HIGH (ref 12.1–16.2)
WBC: 13.3 10*3/uL — ABNORMAL HIGH (ref 3.6–10.2)

## 2022-08-22 LAB — URINALYSIS, MICROSCOPIC: WBCS: <1 /hpf (ref <6)

## 2022-08-22 LAB — COMPREHENSIVE METABOLIC PANEL, NON-FASTING
ALBUMIN/GLOBULIN RATIO: 1.3 (ref 0.8–1.4)
ALBUMIN: 4.1 g/dL (ref 3.5–5.7)
ALKALINE PHOSPHATASE: 141 U/L — ABNORMAL HIGH (ref 34–104)
ALT (SGPT): 114 U/L — ABNORMAL HIGH (ref 7–52)
ANION GAP: 16 mmol/L — ABNORMAL HIGH (ref 4–13)
AST (SGOT): 195 U/L — ABNORMAL HIGH (ref 13–39)
BILIRUBIN TOTAL: 1.5 mg/dL — ABNORMAL HIGH (ref 0.3–1.2)
BUN/CREA RATIO: 11 (ref 6–22)
BUN: 7 mg/dL (ref 7–25)
CALCIUM, CORRECTED: 9.9 mg/dL (ref 8.9–10.8)
CALCIUM: 10 mg/dL (ref 8.6–10.3)
CHLORIDE: 93 mmol/L — ABNORMAL LOW (ref 98–107)
CO2 TOTAL: 25 mmol/L (ref 21–31)
CREATININE: 0.63 mg/dL (ref 0.60–1.30)
ESTIMATED GFR: 120 mL/min/{1.73_m2} (ref >59)
GLOBULIN: 3.2 (ref 2.9–5.4)
GLUCOSE: 213 mg/dL — ABNORMAL HIGH (ref 74–109)
OSMOLALITY, CALCULATED: 273 mOsm/kg (ref 270–290)
POTASSIUM: 2.9 mmol/L — ABNORMAL LOW (ref 3.5–5.1)
PROTEIN TOTAL: 7.3 g/dL (ref 6.4–8.9)
SODIUM: 134 mmol/L — ABNORMAL LOW (ref 136–145)

## 2022-08-22 LAB — URINALYSIS, MACROSCOPIC
BILIRUBIN: NEGATIVE mg/dL
BLOOD: NEGATIVE mg/dL
GLUCOSE: 100 mg/dL — AB
KETONES: NEGATIVE mg/dL
LEUKOCYTES: NEGATIVE WBCs/uL
NITRITE: NEGATIVE
PH: 6 (ref 5.0–9.0)
PROTEIN: NEGATIVE mg/dL
SPECIFIC GRAVITY: 1.003 (ref 1.002–1.030)
UROBILINOGEN: NORMAL mg/dL

## 2022-08-22 LAB — DRUG SCREEN, NO CONFIRMATION, URINE
AMPHET QL: NEGATIVE
BARB QL: NEGATIVE
BENZO QL: NEGATIVE
BUP QL: NEGATIVE
CANNAQL: NEGATIVE
COCQL: NEGATIVE
FENTANYL, RANDOM URINE: NEGATIVE
METHQL: NEGATIVE
OPIATE: NEGATIVE
OXYCODONE URINE: NEGATIVE
PCP QL: NEGATIVE

## 2022-08-22 LAB — MAGNESIUM: MAGNESIUM: 1.9 mg/dL (ref 1.9–2.7)

## 2022-08-22 LAB — ETHANOL, SERUM/PLASMA: ETHANOL: 50 mg/dL — ABNORMAL HIGH

## 2022-08-22 LAB — CREATINE KINASE (CK), TOTAL, SERUM OR PLASMA: CREATINE KINASE: 43 U/L (ref 30–223)

## 2022-08-22 MED ORDER — METHOCARBAMOL 500 MG TABLET
500.0000 mg | ORAL_TABLET | ORAL | Status: AC
Start: 2022-08-22 — End: 2022-08-22
  Administered 2022-08-22: 500 mg via ORAL

## 2022-08-22 MED ORDER — POTASSIUM CHLORIDE ER 20 MEQ TABLET,EXTENDED RELEASE(PART/CRYST)
40.0000 meq | ORAL_TABLET | ORAL | Status: AC
Start: 2022-08-22 — End: 2022-08-22
  Administered 2022-08-22: 40 meq via ORAL

## 2022-08-22 MED ORDER — HYDROCODONE 5 MG-ACETAMINOPHEN 325 MG TABLET
ORAL_TABLET | ORAL | Status: AC
Start: 2022-08-22 — End: 2022-08-22
  Filled 2022-08-22: qty 1

## 2022-08-22 MED ORDER — POTASSIUM CHLORIDE ER 20 MEQ TABLET,EXTENDED RELEASE(PART/CRYST)
ORAL_TABLET | ORAL | Status: AC
Start: 2022-08-22 — End: 2022-08-22
  Filled 2022-08-22: qty 2

## 2022-08-22 MED ORDER — NAPROXEN 250 MG TABLET
ORAL_TABLET | ORAL | Status: AC
Start: 2022-08-22 — End: 2022-08-22
  Filled 2022-08-22: qty 2

## 2022-08-22 MED ORDER — SODIUM CHLORIDE 0.9 % (FLUSH) INJECTION SYRINGE
3.0000 mL | INJECTION | INTRAMUSCULAR | Status: DC | PRN
Start: 2022-08-22 — End: 2022-08-22

## 2022-08-22 MED ORDER — METHOCARBAMOL 500 MG TABLET
ORAL_TABLET | ORAL | Status: AC
Start: 2022-08-22 — End: 2022-08-22
  Filled 2022-08-22: qty 1

## 2022-08-22 MED ORDER — NAPROXEN 250 MG TABLET
500.0000 mg | ORAL_TABLET | ORAL | Status: DC
Start: 2022-08-22 — End: 2022-08-22

## 2022-08-22 MED ORDER — POTASSIUM CHLORIDE 20 MEQ/100ML IN STERILE WATER INTRAVENOUS PIGGYBACK
INJECTION | INTRAVENOUS | Status: AC
Start: 2022-08-22 — End: 2022-08-22
  Filled 2022-08-22: qty 100

## 2022-08-22 MED ORDER — SODIUM CHLORIDE 0.9 % IV BOLUS
1000.0000 mL | INJECTION | Status: AC
Start: 2022-08-22 — End: 2022-08-22
  Administered 2022-08-22: 1000 mL via INTRAVENOUS
  Administered 2022-08-22: 0 mL via INTRAVENOUS

## 2022-08-22 MED ORDER — HYDROCODONE 5 MG-ACETAMINOPHEN 325 MG TABLET
1.0000 | ORAL_TABLET | ORAL | Status: AC
Start: 2022-08-22 — End: 2022-08-22
  Administered 2022-08-22: 1 via ORAL

## 2022-08-22 MED ORDER — POTASSIUM CHLORIDE 20 MEQ/100ML IN STERILE WATER INTRAVENOUS PIGGYBACK
20.0000 meq | INJECTION | INTRAVENOUS | Status: AC
Start: 2022-08-22 — End: 2022-08-22
  Administered 2022-08-22: 0 meq via INTRAVENOUS
  Administered 2022-08-22: 20 meq via INTRAVENOUS

## 2022-08-22 MED ORDER — SODIUM CHLORIDE 0.9 % (FLUSH) INJECTION SYRINGE
3.0000 mL | INJECTION | Freq: Three times a day (TID) | INTRAMUSCULAR | Status: DC
Start: 2022-08-22 — End: 2022-08-22
  Administered 2022-08-22: 0 mL

## 2022-08-22 MED ORDER — NAPROXEN 250 MG TABLET
500.0000 mg | ORAL_TABLET | ORAL | Status: AC
Start: 2022-08-22 — End: 2022-08-22
  Administered 2022-08-22: 500 mg via ORAL

## 2022-08-22 NOTE — ED APP Handoff Note (Signed)
[facility]  Emergency Department  Course Note    Care/report received from [place] at  6:00 a.m.Marland Kitchen  Per report:  [name] is a 47 y.o. male who had concerns including Leg Pain.  Patient presents to ED today for leg pain that has been going on for 3 days now.  Patient is adamant that he did steroids causing his problems.  Upon assessment no abnormalities were seen of the lower extremities.  CNS is intact.  Capillary refill is brisk.  Color is within normal limits.  No warmth no redness.  Educated patient about his diagnosis.  Offered patient prophylactic doxy due to his possible exposure and high-risk job.  Patient declined that at this time.    Pending labs/imaging/consults:  CT of the abdomen  Plan:  Discharge    Course:      Following the history, physical exam, and ED workup, the patient was deemed stable and suitable for discharge. The patient/caregiver was advised to return to the ED for any new or worsening symptoms. Discharge medications, and follow-up instructions were discussed with the patient/caregiver in detail, who verbalizes understanding. The patient/caregiver is in agreement and is comfortable with the plan of care.    Disposition: Discharged         Current Discharge Medication List        CONTINUE these medications - NO CHANGES were made during your visit.        Details   albuterol sulfate 90 mcg/actuation oral inhaler  Commonly known as: PROVENTIL or VENTOLIN or PROAIR   1-2 Puffs, Inhalation, EVERY 6 HOURS PRN  Refills: 0     Apriso 0.375 gram Capsule, Sust. Release 24 hr  Generic drug: Mesalamine   1,500 mg, Oral, DAILY  Refills: 0     BROMPHENIR-DIPHENHYD-PHENYLEPH ORAL   Oral  Refills: 0     budesonide-formoteroL 160-4.5 mcg/actuation oral inhaler  Commonly known as: SYMBICORT   2 Puffs, Inhalation, 2 TIMES DAILY  Refills: 0     cholestyramine-sucrose 4 gram Powder in Packet  Commonly known as: QUESTRAN   1 Packet, Oral, DAILY  Refills: 0      famotidine 40 mg Tablet  Commonly known as: PEPCID   40 mg, Oral, 2 TIMES DAILY  Refills: 0     levoFLOXacin 750 mg Tablet  Commonly known as: LEVAQUIN   750 mg, Oral, DAILY  Refills: 0     omeprazole 40 mg Capsule, Delayed Release(E.C.)  Commonly known as: PRILOSEC   40 mg, Oral, DAILY  Refills: 0     Pseudoephedrine-Guaifenesin 60-600 mg Tablet Sustained Release 12 hr  Commonly known as: MUCINEX D   1 Tablet, Oral, EVERY 12 HOURS  Refills: 0     spironolactone 50 mg Tablet  Commonly known as: ALDACTONE   1 Tablet, Oral, DAILY  Refills: 0            Follow up:   No follow-up provider specified.    Clinical Impression   Pain in both lower extremities (Primary)   Hypokalemia   Vitamin B deficiency         Sheppard Coil, APRN

## 2022-08-22 NOTE — ED Nurses Note (Signed)
REPORT GIVEN TO Joellen Jersey, RN

## 2022-08-22 NOTE — ED Nurses Note (Signed)
PT STATES PAIN IS STILL 10/10, PROVIDER NOTIFIED.  ORDERS PLACED.

## 2022-08-22 NOTE — ED Nurses Note (Signed)
PT C/O BILATERAL LEG PAIN FOR 2 DAYS.  PT STATES He THOUGHT IT MIGHT BE FROM STEROIDS He WAS PRESCRIBED FOR BRONCHITIS.  PT RATES THE PAIN 10/10.  PT DENIES SHORTNESS OF BREATH, COUGH, FEVER, NVD.  PT ADMITS TO DRINKING ALCOHOL DAILY, LAST DRINK WAS YESTERDAY.  DENIES RECENT FALL OR INJURY

## 2022-08-22 NOTE — Discharge Instructions (Signed)
Today in the ED, we completed a workup for your complaint of a pain. The findings on your exam today and on your blood work and/or imaging is reassuring. Your workup did not show any emergent findings requiring hospitalization.  Sometimes, we do not always find the cause for your symptoms in one ER visit.  At this time, it is not [id number] certain what is causing your symptoms, but we feel you can be discharged from the Emergency Department.     It is possible this may worsen or get better. Please, if you get worse or your symptoms change, return to ED. Otherwise, we strongly encourage you to follow up with primary care office within 24-48 hours or any of the specialists we have recommended.     Thank you for allowing [place] to be part of your care.

## 2022-08-22 NOTE — ED Triage Notes (Signed)
EMS called for Bilateral legs and feet pain. Pt informed EMS that the pain has been ongoing for x 3 days.

## 2022-08-22 NOTE — ED Provider Notes (Signed)
[facility]  Emergency Department  Advanced Practice Provider Note      CHIEF COMPLAINT  Chief Complaint   Patient presents with    Leg Pain     HISTORY OF PRESENT ILLNESS  [name], date of birth 1976/06/03, is a 47 y.o. male who presented to the Emergency Department.    Patient is a 47 year old Caucasian male who presents to the ED via EMS from home with reports of bilateral leg pain for the last 3 days.  Patient describes pain as a constant ache.  Reports pain is worse with weight-bearing and walking.  Denies any recent injury, trauma or fall.  Patient denies any recent fever or chills.  Denies any swelling or skin discoloration to the legs.  Denies any history of DVT.  Denies any history of gout.  Denies any history of osteoarthritis.  Denies any recent insect or tick bites.      PAST MEDICAL/SURGICAL/FAMILY/SOCIAL HISTORY  Past Medical History:   Diagnosis Date    Asthma     Bleeding hemorrhoid     Bleeding ulcer     Cervical herniated disc     COPD (chronic obstructive pulmonary disease) (CMS HCC)     GERD (gastroesophageal reflux disease)     Hx MRSA infection     Hyperlipidemia     Hypertension     Rotator cuff arthropathy, right     Ulcerative colitis (CMS HCC)        Past Surgical History:   Procedure Laterality Date    HX CYST REMOVAL      lower back    HX HERNIA REPAIR      HX TONSILLECTOMY         Family Medical History:       Problem Relation (Age of Onset)    Anesthesia Complications Father    Arthritis-osteo Mother, Father    Asthma Mother, Father, Brother, Paternal Grandmother, Paternal Grandfather    Blood Clots Father, Paternal Grandmother, Paternal Grandfather    Cancer Mother, Father    Congestive Heart Failure Father, Paternal 23, Paternal Grandfather    Coronary Artery Disease Father, Paternal 8, Paternal Grandfather    Diabetes Mother, Father    Heart Attack Father, Paternal 36, Paternal Grandfather    High Cholesterol Mother,  Father    Hypertension (High Blood Pressure) Mother, Father    Stroke Father, Maternal Grandmother, Maternal Grandfather, Paternal 54, Paternal Grandfather          Social History     Socioeconomic History    Marital status: Married   Tobacco Use    Smoking status: Every Day     Current packs/day: 1.00     Average packs/day: 1 pack/day for 30.0 years (30.0 ttl pk-yrs)     Types: Cigarettes    Smokeless tobacco: Never   Vaping Use    Vaping status: Never Used   Substance and Sexual Activity    Alcohol use: Yes     Alcohol/week: 8.0 standard drinks of alcohol     Types: 8 Cans of beer per week     Comment: occasional    Drug use: Never     Social Determinants of Health     Financial Resource Strain: Low Risk  (11/11/2021)    Financial Resource Strain     SDOH Financial: No   Transportation Needs: Low Risk  (11/11/2021)    Transportation Needs     SDOH Transportation: No   Social Connections: Low Risk  (11/11/2021)  Social Connections     SDOH Social Isolation: 5 or more times a week   Intimate Partner Violence: Low Risk  (11/11/2021)    Intimate Partner Violence     SDOH Domestic Violence: No   Housing Stability: Low Risk  (11/11/2021)    Housing Stability     SDOH Housing Situation: I have housing.     SDOH Housing Worry: No      ALLERGIES  Allergies   Allergen Reactions    Noctec [Chloral Hydrate] Anaphylaxis    Penicillins Anaphylaxis    Theophylline Shortness of Breath    Flexeril [Cyclobenzaprine] Mental Status Effect     Homicidal ideation    Prednisone Myalgia       PHYSICAL EXAM  VITAL SIGNS:  Filed Vitals:    08/22/22 0338   BP: (!) 142/93   Pulse: 92   Resp: 18   Temp: 36.5 C (97.7 F)   SpO2: 93%     Constitutional: Awake. Well appearing. Overweight. No distress noted.   Cardiovascular: RRR, S1, S2.   Pulmonary/Chest: Breath sounds clear and equal bilaterally. No respiratory distress. No wheezes, rales or chest tenderness.   Abdominal: Bowel sound normal. Abdomen soft, no tenderness, rebound or  guarding.    Musculoskeletal: No edema, tenderness or deformity. Normal muscle tone and strength.   Skin: warm and dry. No rash, redness, or bruising  Psychiatric: normal mood and affect. Behavior is normal.   Neurological: Alert, oriented. Normal gait. No focal weakness noted. No sensory deficit    Nursing notes reviewed.       DIAGNOSTICS  Labs:  Labs listed below were reviewed and interpreted by me.  Results for orders placed or performed during the hospital encounter of 08/22/22   COMPREHENSIVE METABOLIC PANEL, NON-FASTING   Result Value Ref Range    SODIUM 134 (L) 136 - 145 mmol/L    POTASSIUM 2.9 (L) 3.5 - 5.1 mmol/L    CHLORIDE 93 (L) 98 - 107 mmol/L    CO2 TOTAL 25 21 - 31 mmol/L    ANION GAP 16 (H) 4 - 13 mmol/L    BUN 7 7 - 25 mg/dL    CREATININE 0.63 0.60 - 1.30 mg/dL    BUN/CREA RATIO 11 6 - 22    ESTIMATED GFR 120 >59 mL/min/1.6m2    ALBUMIN 4.1 3.5 - 5.7 g/dL    CALCIUM 10.0 8.6 - 10.3 mg/dL    GLUCOSE 213 (H) 74 - 109 mg/dL    ALKALINE PHOSPHATASE 141 (H) 34 - 104 U/L    ALT (SGPT) 114 (H) 7 - 52 U/L    AST (SGOT) 195 (H) 13 - 39 U/L    BILIRUBIN TOTAL 1.5 (H) 0.3 - 1.2 mg/dL    PROTEIN TOTAL 7.3 6.4 - 8.9 g/dL    ALBUMIN/GLOBULIN RATIO 1.3 0.8 - 1.4    OSMOLALITY, CALCULATED 273 270 - 290 mOsm/kg    CALCIUM, CORRECTED 9.9 8.9 - 10.8 mg/dL    GLOBULIN 3.2 2.9 - 5.4   ETHANOL, SERUM   Result Value Ref Range    ETHANOL 50 (H) 0 mg/dL   CREATINE KINASE (CK), TOTAL, SERUM   Result Value Ref Range    CREATINE KINASE 43 30 - 223 U/L   MAGNESIUM   Result Value Ref Range    MAGNESIUM 1.9 1.9 - 2.7 mg/dL   CBC WITH DIFF   Result Value Ref Range    WBC 13.3 (H) 3.6 - 10.2 x10^3/uL    RBC 4.47  4.06 - 5.63 x10^6/uL    HGB 15.6 12.5 - 16.3 g/dL    HCT 46.1 36.7 - 47.1 %    MCV 103.1 (H) 73.0 - 96.2 fL    MCH 34.8 (H) 23.8 - 33.4 pg    MCHC 33.8 32.5 - 36.3 g/dL    RDW 17.5 (H) 12.1 - 16.2 %    PLATELETS 293 140 - 440 x10^3/uL    MPV 7.1 (L) 7.4 - 11.4 fL    NEUTROPHIL % 69 43 - 77 %    LYMPHOCYTE % 18 16 - 44 %     MONOCYTE % 11 5 - 13 %    EOSINOPHIL % 2 %    BASOPHIL % 1 0 - 1 %    NEUTROPHIL # 9.10 (H) 1.85 - 7.80 x10^3/uL    LYMPHOCYTE # 2.40 1.00 - 3.00 x10^3/uL    MONOCYTE # 1.50 (H) 0.30 - 1.00 x10^3/uL    EOSINOPHIL # 0.20 0.00 - 0.50 x10^3/uL    BASOPHIL # 0.10 0.00 - 0.10 x10^3/uL   URINALYSIS, MACROSCOPIC   Result Value Ref Range    COLOR Yellow Colorless, Light Yellow, Yellow    APPEARANCE Clear Clear    SPECIFIC GRAVITY 1.003 1.002 - 1.030    PH 6.0 5.0 - 9.0    LEUKOCYTES Negative Negative, 100  WBCs/uL    NITRITE Negative Negative    PROTEIN Negative Negative, 10 , 20  mg/dL    GLUCOSE 100 (A) Negative, 30  mg/dL    KETONES Negative Negative, Trace mg/dL    BILIRUBIN Negative Negative, 0.5 mg/dL    BLOOD Negative Negative, 0.03 mg/dL    UROBILINOGEN Normal Normal mg/dL   URINALYSIS, MICROSCOPIC   Result Value Ref Range    RBCS      WBCS <1 <6 /hpf     Radiology:       ED COURSE/MEDICAL DECISION MAKING      ED Course as of 08/22/22 0454   Mon Aug 22, 2022   0445 WBC(!): 13.3  Slightly elevated. PMNs 69. Patient has been taking Prednisone for bronchitis    0448 POTASSIUM(!): 2.9  Low. Will supplement   0449 CREATININE: 0.63  GFR 120 BUN 7   0449 BILIRUBIN, TOTAL(!): 1.5  ALT 114 AST 195 Alk Phos 141   0451 ETHANOL, SERUM(!): 50  Elevated   0451 CREATINE KINASE (CK): 43  Normal   0451 ANION GAP(!): 16  Glucose 213. Bicarb 25   0451 LEUKOCYTES: Negative  Nitrite negative. WBC <1      Medical Decision Making  Patient's list of differential diagnosis includes but is not limited to rhabdomyolysis, muscle spasms, electrolyte imbalance, dehydration, Lyme disease, gout, osteoarthritis, neuropathy    Patient's white count is slightly elevated at 13.3 and neutrophils 69.  Patient has been taking a course of prednisone for bronchitis.  Patient's electrolytes are normal except potassium is low at 2.9.  We will give oral and IV supplement.  Patient's iron gap is slightly elevated at 16, glucose 213 and bicarb 25.  Patient  will be given IV fluids, 1 L normal saline.  Patient's renal function shows creatinine 0.63, GFR 120 and BUN 7.  Patient's CK is normal.  Patient's alcohol level is 50.  Patient's liver enzymes show total bilirubin 1.5, ALT 114, AST 195 and alk-phos 141.  Patient's liver enzymes in December of 2003 showed total bilirubin 0.8, ALT 65, AST 116 and alk phos 149.  According to EMR, patient does have a  history of cirrhosis.  Will order CT scan to assess severity of liver disease at this time.      Care endorsed to Sheppard Coil, NP    Amount and/or Complexity of Data Reviewed  Labs: ordered.  Radiology: ordered.  ECG/medicine tests: independent interpretation performed.    Risk  Prescription drug management.      CLINICAL IMPRESSION  Clinical Impression   Pain in both lower extremities (Primary)   Hypokalemia     DISPOSITION  Data Unavailable       DISCHARGE MEDICATIONS  Current Discharge Medication List          Hewitt Shorts, FNP-C 08/22/2022, 03:47   [facility]  Department of Emergency [place]    This note was partially generated using MModal Fluency Direct system, and there may be some incorrect words, spellings, and punctuation that were not noted in checking the note before saving.    -----

## 2022-08-22 NOTE — ED Nurses Note (Signed)
Oriented times four. Discharge instructions reviewed with patient including work excuse. Instructed to return to ER with any complications. Patient verbalizes understanding of all information provided. Peripheral IV discontinued with catheter intact and pressure dressing applied. Tolerated well. PIV site WNL. Patient left this facility ambulatory and in care of self. Care relinquished at this time.

## 2022-08-23 LAB — LYME ANTIBODY PANEL WITH REFLEX: LYME ANTIBODY TOTAL (Screen): NEGATIVE

## 2022-08-25 ENCOUNTER — Other Ambulatory Visit (HOSPITAL_COMMUNITY): Payer: Self-pay | Admitting: Family

## 2022-08-25 DIAGNOSIS — R935 Abnormal findings on diagnostic imaging of other abdominal regions, including retroperitoneum: Secondary | ICD-10-CM

## 2022-10-07 ENCOUNTER — Ambulatory Visit (HOSPITAL_COMMUNITY): Payer: Medicaid Other

## 2022-10-30 ENCOUNTER — Observation Stay
Admission: EM | Admit: 2022-10-30 | Discharge: 2022-11-01 | Disposition: A | Discharge status: Home / Self Care | Payer: Medicaid Other | Attending: HOSPITALIST | Admitting: HOSPITALIST

## 2022-10-30 ENCOUNTER — Other Ambulatory Visit: Payer: Self-pay

## 2022-10-30 ENCOUNTER — Emergency Department (HOSPITAL_COMMUNITY): Payer: Medicaid Other

## 2022-10-30 ENCOUNTER — Observation Stay (HOSPITAL_COMMUNITY): Payer: Medicaid Other | Admitting: Emergency Medicine

## 2022-10-30 DIAGNOSIS — F10929 Alcohol use, unspecified with intoxication, unspecified: Secondary | ICD-10-CM

## 2022-10-30 DIAGNOSIS — F1010 Alcohol abuse, uncomplicated: Secondary | ICD-10-CM | POA: Diagnosis present

## 2022-10-30 DIAGNOSIS — Z792 Long term (current) use of antibiotics: Secondary | ICD-10-CM | POA: Insufficient documentation

## 2022-10-30 DIAGNOSIS — R3911 Hesitancy of micturition: Secondary | ICD-10-CM | POA: Insufficient documentation

## 2022-10-30 DIAGNOSIS — M48061 Spinal stenosis, lumbar region without neurogenic claudication: Secondary | ICD-10-CM | POA: Insufficient documentation

## 2022-10-30 DIAGNOSIS — K5190 Ulcerative colitis, unspecified, without complications: Principal | ICD-10-CM | POA: Insufficient documentation

## 2022-10-30 DIAGNOSIS — K760 Fatty (change of) liver, not elsewhere classified: Secondary | ICD-10-CM | POA: Insufficient documentation

## 2022-10-30 DIAGNOSIS — Z79899 Other long term (current) drug therapy: Secondary | ICD-10-CM | POA: Insufficient documentation

## 2022-10-30 DIAGNOSIS — Y906 Blood alcohol level of 120-199 mg/100 ml: Secondary | ICD-10-CM | POA: Insufficient documentation

## 2022-10-30 DIAGNOSIS — K5730 Diverticulosis of large intestine without perforation or abscess without bleeding: Secondary | ICD-10-CM | POA: Insufficient documentation

## 2022-10-30 DIAGNOSIS — F10129 Alcohol abuse with intoxication, unspecified: Secondary | ICD-10-CM | POA: Insufficient documentation

## 2022-10-30 DIAGNOSIS — E876 Hypokalemia: Secondary | ICD-10-CM | POA: Insufficient documentation

## 2022-10-30 DIAGNOSIS — E872 Acidosis, unspecified: Secondary | ICD-10-CM | POA: Insufficient documentation

## 2022-10-30 HISTORY — DX: Prediabetes: R73.03

## 2022-10-30 LAB — CBC WITH DIFF
BASOPHIL #: 0.2 10*3/uL — ABNORMAL HIGH (ref 0.00–0.10)
BASOPHIL %: 1 % (ref 0–1)
EOSINOPHIL #: 0.3 10*3/uL (ref 0.00–0.50)
EOSINOPHIL %: 2 %
HCT: 46.3 % (ref 36.7–47.1)
HGB: 15.9 g/dL (ref 12.5–16.3)
LYMPHOCYTE #: 3.6 10*3/uL — ABNORMAL HIGH (ref 1.00–3.00)
LYMPHOCYTE %: 24 % (ref 16–44)
MCH: 35.8 pg — ABNORMAL HIGH (ref 23.8–33.4)
MCHC: 34.3 g/dL (ref 32.5–36.3)
MCV: 104.3 fL — ABNORMAL HIGH (ref 73.0–96.2)
MONOCYTE #: 1.4 10*3/uL — ABNORMAL HIGH (ref 0.30–1.00)
MONOCYTE %: 9 % (ref 5–13)
MPV: 7.8 fL (ref 7.4–11.4)
NEUTROPHIL #: 9.6 10*3/uL — ABNORMAL HIGH (ref 1.85–7.80)
NEUTROPHIL %: 64 % (ref 43–77)
PLATELETS: 338 10*3/uL (ref 140–440)
RBC: 4.44 10*6/uL (ref 4.06–5.63)
RDW: 14.6 % (ref 12.1–16.2)
WBC: 15.1 10*3/uL — ABNORMAL HIGH (ref 3.6–10.2)

## 2022-10-30 LAB — BASIC METABOLIC PANEL
ANION GAP: 11 mmol/L (ref 4–13)
BUN/CREA RATIO: 3 — ABNORMAL LOW (ref 6–22)
BUN: 2 mg/dL — ABNORMAL LOW (ref 7–25)
CALCIUM: 8.2 mg/dL — ABNORMAL LOW (ref 8.6–10.3)
CHLORIDE: 99 mmol/L (ref 98–107)
CO2 TOTAL: 27 mmol/L (ref 21–31)
CREATININE: 0.61 mg/dL (ref 0.60–1.30)
ESTIMATED GFR: 121 mL/min/{1.73_m2} (ref >59)
GLUCOSE: 131 mg/dL — ABNORMAL HIGH (ref 74–109)
OSMOLALITY, CALCULATED: 272 mOsm/kg (ref 270–290)
POTASSIUM: 3.5 mmol/L (ref 3.5–5.1)
SODIUM: 137 mmol/L (ref 136–145)

## 2022-10-30 LAB — ETHANOL, SERUM/PLASMA: ETHANOL: 196 mg/dL — ABNORMAL HIGH

## 2022-10-30 LAB — URINALYSIS, MACROSCOPIC
BILIRUBIN: NEGATIVE mg/dL
BLOOD: NEGATIVE mg/dL
GLUCOSE: NEGATIVE mg/dL
KETONES: NEGATIVE mg/dL
LEUKOCYTES: NEGATIVE WBCs/uL
NITRITE: NEGATIVE
PH: 6.5 (ref 5.0–9.0)
PROTEIN: NEGATIVE mg/dL
SPECIFIC GRAVITY: 1.005 (ref 1.002–1.030)
UROBILINOGEN: 2 mg/dL — AB

## 2022-10-30 LAB — VENOUS BLOOD GAS/LACTATE
%FIO2 (VENOUS): 21 %
BASE EXCESS: 4.4 mmol/L — ABNORMAL HIGH (ref 0.0–2.0)
BICARBONATE (VENOUS): 27.2 mmol/L — ABNORMAL HIGH (ref 22.0–26.0)
CARBOXYHEMOGLOBIN: 7.2 % — ABNORMAL HIGH (ref <=1.5)
LACTATE: 5.5 mmol/L (ref <=2.0)
MET-HEMOGLOBIN: 0.1 % (ref <=2.0)
O2CT: 16 %
OXYHEMOGLOBIN: 71.6 %
PCO2 (VENOUS): 42 mm/Hg (ref 41–51)
PH (VENOUS): 7.44 — ABNORMAL HIGH (ref 7.31–7.41)
PO2 (VENOUS): 62 mm/Hg — ABNORMAL HIGH (ref 35–50)

## 2022-10-30 LAB — COMPREHENSIVE METABOLIC PANEL, NON-FASTING
ALBUMIN/GLOBULIN RATIO: 1.2 (ref 0.8–1.4)
ALBUMIN: 3.7 g/dL (ref 3.5–5.7)
ALKALINE PHOSPHATASE: 133 U/L — ABNORMAL HIGH (ref 34–104)
ALT (SGPT): 57 U/L — ABNORMAL HIGH (ref 7–52)
ANION GAP: 18 mmol/L — ABNORMAL HIGH (ref 4–13)
AST (SGOT): 111 U/L — ABNORMAL HIGH (ref 13–39)
BILIRUBIN TOTAL: 1.4 mg/dL — ABNORMAL HIGH (ref 0.3–1.2)
BUN/CREA RATIO: 5 — ABNORMAL LOW (ref 6–22)
BUN: 3 mg/dL — ABNORMAL LOW (ref 7–25)
CALCIUM, CORRECTED: 9.3 mg/dL (ref 8.9–10.8)
CALCIUM: 9.1 mg/dL (ref 8.6–10.3)
CHLORIDE: 93 mmol/L — ABNORMAL LOW (ref 98–107)
CO2 TOTAL: 25 mmol/L (ref 21–31)
CREATININE: 0.59 mg/dL — ABNORMAL LOW (ref 0.60–1.30)
ESTIMATED GFR: 123 mL/min/{1.73_m2} (ref >59)
GLOBULIN: 3.2 (ref 2.9–5.4)
GLUCOSE: 197 mg/dL — ABNORMAL HIGH (ref 74–109)
OSMOLALITY, CALCULATED: 274 mOsm/kg (ref 270–290)
POTASSIUM: 2.5 mmol/L — CL (ref 3.5–5.1)
PROTEIN TOTAL: 6.9 g/dL (ref 6.4–8.9)
SODIUM: 136 mmol/L (ref 136–145)

## 2022-10-30 LAB — LACTIC ACID - FIRST REFLEX: LACTIC ACID: 5.5 mmol/L (ref 0.5–2.2)

## 2022-10-30 LAB — POC BLOOD GLUCOSE (RESULTS): GLUCOSE, POC: 198 mg/dl — ABNORMAL HIGH (ref 70–100)

## 2022-10-30 LAB — TROPONIN-I
TROPONIN I: 11 ng/L (ref <20)
TROPONIN I: 14 ng/L (ref <20)
TROPONIN I: 11 ng/L (ref <20)

## 2022-10-30 LAB — LACTIC ACID LEVEL W/ REFLEX FOR LEVEL >2.0: LACTIC ACID: 4 mmol/L — ABNORMAL HIGH (ref 0.5–2.2)

## 2022-10-30 LAB — LACTIC ACID - SECOND REFLEX: LACTIC ACID: 3.8 mmol/L — ABNORMAL HIGH (ref 0.5–2.2)

## 2022-10-30 LAB — URINALYSIS, MICROSCOPIC: WBCS: <1 /hpf (ref <6)

## 2022-10-30 LAB — LIPASE: LIPASE: 18 U/L (ref 11–82)

## 2022-10-30 LAB — BETA-HYDROXYBUTYRATE, PLASMA OR SERUM: BETA-HYDROXYBUTYRATE: 0.12 mmol/L (ref 0.02–0.29)

## 2022-10-30 MED ORDER — SODIUM CHLORIDE 0.9 % IV BOLUS
500.0000 mL | INJECTION | Status: DC
Start: 2022-10-30 — End: 2022-10-31
  Administered 2022-10-30: 0 mL via INTRAVENOUS

## 2022-10-30 MED ORDER — SODIUM CHLORIDE 0.9 % IV BOLUS
2000.0000 mL | INJECTION | Status: AC
Start: 2022-10-30 — End: 2022-10-30
  Administered 2022-10-30: 0 mL via INTRAVENOUS
  Administered 2022-10-30: 2000 mL via INTRAVENOUS

## 2022-10-30 MED ORDER — POTASSIUM BICARBONATE-CITRIC ACID 25 MEQ EFFERVESCENT TABLET
50.0000 meq | EFFERVESCENT_TABLET | ORAL | Status: AC
Start: 2022-10-30 — End: 2022-10-30
  Administered 2022-10-30 (×2): 50 meq via ORAL

## 2022-10-30 MED ORDER — POTASSIUM BICARBONATE-CITRIC ACID 25 MEQ EFFERVESCENT TABLET
EFFERVESCENT_TABLET | ORAL | Status: AC
Start: 2022-10-30 — End: 2022-10-30
  Filled 2022-10-30: qty 2

## 2022-10-30 MED ORDER — POTASSIUM CHLORIDE 20 MEQ/100ML IN STERILE WATER INTRAVENOUS PIGGYBACK
INJECTION | INTRAVENOUS | Status: AC
Start: 2022-10-30 — End: 2022-10-30
  Filled 2022-10-30: qty 100

## 2022-10-30 MED ORDER — POTASSIUM CHLORIDE 20 MEQ/100ML IN STERILE WATER INTRAVENOUS PIGGYBACK
20.0000 meq | INJECTION | INTRAVENOUS | Status: AC
Start: 2022-10-30 — End: 2022-10-30
  Administered 2022-10-30: 20 meq via INTRAVENOUS
  Administered 2022-10-30: 0 meq via INTRAVENOUS

## 2022-10-30 NOTE — ED Nurses Note (Signed)
Report given to Jenna, RN. Care endorsed.

## 2022-10-30 NOTE — ED Provider Notes (Signed)
[facility]  ED Primary Provider Note  Patient Name: [name]  Patient Age: 47 y.o.  Date of Birth: [birth year]    Chief Complaint: Abnormal Lab Result        History of Present Illness       [name] is a 47 y.o. male who had concerns including Abnormal Lab Result.  This patient is a 47 year old male who presents with initial complaint of elbow relapse result.  The patient follows with Dr. Noe Gens and was told all of his labs or abnormal.  The patient is unclear what exactly is abnormal however he states that 4 days ago he was called by his primary care doctor, and told to go immediately to the hospital to get admitted, and IV therapy.  The patient does not currently complain of any pain.  [name] states he waited to come to the emergency department secondary to making sure things were in order for his family.        Review of Systems     No other overt Review of Systems are noted to be positive except noted in the HPI.    { Be sure to review and modify ROS as appropriate. As of Jun 20, 2021 you are only required to have a "medically appropriate" ROS and PE for billing purposes. This help text will disappear when signing your note.:123}  Historical Data   History Reviewed This Encounter: ***History has not been "Marked as Reviewed" - Go to the top of the ED Triage Tab to document your review. Make sure history sections are updated and accurate - Click Here to jump to History for update.***      Physical Exam   ED Triage Vitals [10/30/22 1617]   BP (Non-Invasive) (!) 140/82   Heart Rate 94   Respiratory Rate 18   Temperature 36.7 C (98 F)   SpO2 95 %   Weight 83.5 kg (184 lb)   Height 1.676 m (5\' 6" )     {Be sure to review and modify Physical Exam as appropriate. As of Jun 20, 2021 you are only required to have a "medically appropriate" ROS and PE for billing purposes. This help text will disappear when signing your  note.:123}    ***          Procedures      Patient Data   {Click here to open the ED Workup Activity for clinical data review *This link will automatically disappear upon signing your note*:123}  Labs Ordered/Reviewed   CBC WITH DIFF - Abnormal; Notable for the following components:       Result Value    WBC 15.1 (*)     MCV 104.3 (*)     MCH 35.8 (*)     NEUTROPHIL # 9.60 (*)     LYMPHOCYTE # 3.60 (*)     MONOCYTE # 1.40 (*)     BASOPHIL # 0.20 (*)     All other components within normal limits   CBC/DIFF    Narrative:     The following orders were created for panel order CBC/DIFF.  Procedure                               Abnormality         Status                     ---------                               -----------         ------  CBC WITH DIFF[[id number]]                Abnormal            Final result                 Please view results for these tests on the individual orders.   COMPREHENSIVE METABOLIC PANEL, NON-FASTING   ETHANOL, SERUM/PLASMA   TROPONIN-I   TROPONIN-I   TROPONIN-I   LIPASE   URINALYSIS, MACROSCOPIC AND MICROSCOPIC W/CULTURE REFLEX    Narrative:     The following orders were created for panel order URINALYSIS, MACROSCOPIC AND MICROSCOPIC W/CULTURE REFLEX [PRN ONLY].  Procedure                               Abnormality         Status                     ---------                               -----------         ------                     URINALYSIS, MACROSCOPIC[[id number]]                                                     URINALYSIS, MICROSCOPIC[[id number]]                                                       Please view results for these tests on the individual orders.   DRUG SCREEN, NO CONFIRMATION, URINE   URINALYSIS, MACROSCOPIC   URINALYSIS, MICROSCOPIC   PERFORM POC WHOLE BLOOD GLUCOSE       No orders to display       Medical Decision Making     ED clinical impression missing, please click on the following link to add Clinical Impressions ***  then refresh the note  prior to signing.  {Be sure to fill out the MDM SmartBlock in Notewriter to the left. Do not modify this italicized text, it will disappear upon signing your note:123}  MDM      Studies Assessed and/or Ordered: ***    EKG:   This EKG interpreted by me shows:    Rate: ***    Interpretation: ***      MDM Narrative:  This patient presents with request of evaluation of multiple lab abnormalities. He is unsure of what the abnormalities are, however was instructed to come to the ED 4 days prior. Screening work-up revealed acute alcohol intoxication, and elevated lactate. He has a known elevated AST/ALT per his report. No elevated ketones in blood or urine making alcoholic ketosis/ketoacidosis unlikely. The patient was signed-out to Dr. Hyacinth Meeker awaiting improvement of alcohol level and labs for reassessment.            A 41ml/kg fluid bolus not ordered due to ***.  Please see the EMR for the total amount ordered/administered.                               {  Disposition:[id number]}             ED clinical impression missing, please click on the following link to add Clinical Impressions ***  then refresh the note prior to signing.      Current Discharge Medication List            /R. Baldo Daub, MD, [facility]  Department of Emergency Medicine  [facility]                {Remember to refresh your note prior to signing. Use Control + C12 or click the refresh button at the bottom of the note. This reminder text will automatically disappear when you sign your note.:123}

## 2022-10-30 NOTE — ED Triage Notes (Signed)
Pt states PCP contacted him on Friday, instructed to come to ED for further evaluation due to abnormal labs. Patient states "All my labs are bad, the white blood cells, the potassium, the diabetes"

## 2022-10-31 ENCOUNTER — Emergency Department (HOSPITAL_COMMUNITY): Payer: Medicaid Other

## 2022-10-31 ENCOUNTER — Encounter (HOSPITAL_COMMUNITY): Payer: Self-pay | Admitting: Emergency Medicine

## 2022-10-31 DIAGNOSIS — F17210 Nicotine dependence, cigarettes, uncomplicated: Secondary | ICD-10-CM

## 2022-10-31 DIAGNOSIS — R3911 Hesitancy of micturition: Secondary | ICD-10-CM

## 2022-10-31 DIAGNOSIS — F1010 Alcohol abuse, uncomplicated: Secondary | ICD-10-CM

## 2022-10-31 DIAGNOSIS — R7303 Prediabetes: Secondary | ICD-10-CM

## 2022-10-31 DIAGNOSIS — K5190 Ulcerative colitis, unspecified, without complications: Principal | ICD-10-CM

## 2022-10-31 DIAGNOSIS — E872 Acidosis, unspecified: Secondary | ICD-10-CM

## 2022-10-31 DIAGNOSIS — R9431 Abnormal electrocardiogram [ECG] [EKG]: Secondary | ICD-10-CM

## 2022-10-31 LAB — MANUAL DIFFERENTIAL
EOSINOPHIL %: 3 % (ref 0–7)
EOSINOPHIL ABSOLUTE: 0.38 10*3/uL (ref 0.00–0.80)
EOSINOPHILS MANUAL: 3
LYMPHOCYTE %: 16 % — ABNORMAL LOW (ref 25–45)
LYMPHOCYTE ABSOLUTE: 2 10*3/uL (ref 1.10–5.00)
LYMPHOCYTES MANUAL: 16
MONOCYTE %: 10 % (ref 0–12)
MONOCYTE ABSOLUTE: 1.25 10*3/uL (ref 0.00–1.30)
MONOCYTES MANUAL: 10
MYELOCYTE %: 1 %
MYELOCYTE ABSOLUTE: 0.13 10*3/uL
MYELOCYTES MANUAL: 1
NEUTROPHIL %: 70 % (ref 40–76)
NEUTROPHIL ABSOLUTE: 8.75 10*3/uL — ABNORMAL HIGH (ref 1.80–8.40)
NEUTROPHILS MANUAL: 70
PLATELET MORPHOLOGY COMMENT: NORMAL
STOMATOCYTES: ABSENT
TOTAL CELLS COUNTED [#] IN BLOOD: 100
WBC: 12.5 10*3/uL

## 2022-10-31 LAB — COMPREHENSIVE METABOLIC PANEL, NON-FASTING
ALBUMIN/GLOBULIN RATIO: 1.2 (ref 0.8–1.4)
ALBUMIN: 3.4 g/dL — ABNORMAL LOW (ref 3.5–5.7)
ALKALINE PHOSPHATASE: 122 U/L — ABNORMAL HIGH (ref 34–104)
ALT (SGPT): 54 U/L — ABNORMAL HIGH (ref 7–52)
ANION GAP: 9 mmol/L (ref 4–13)
AST (SGOT): 114 U/L — ABNORMAL HIGH (ref 13–39)
BILIRUBIN TOTAL: 1.3 mg/dL — ABNORMAL HIGH (ref 0.3–1.2)
BUN/CREA RATIO: 3 — ABNORMAL LOW (ref 6–22)
BUN: 2 mg/dL — ABNORMAL LOW (ref 7–25)
CALCIUM, CORRECTED: 8.7 mg/dL — ABNORMAL LOW (ref 8.9–10.8)
CALCIUM: 8.2 mg/dL — ABNORMAL LOW (ref 8.6–10.3)
CHLORIDE: 97 mmol/L — ABNORMAL LOW (ref 98–107)
CO2 TOTAL: 29 mmol/L (ref 21–31)
CREATININE: 0.63 mg/dL (ref 0.60–1.30)
ESTIMATED GFR: 120 mL/min/{1.73_m2} (ref >59)
GLOBULIN: 2.9 (ref 2.9–5.4)
GLUCOSE: 129 mg/dL — ABNORMAL HIGH (ref 74–109)
OSMOLALITY, CALCULATED: 268 mOsm/kg — ABNORMAL LOW (ref 270–290)
POTASSIUM: 3.5 mmol/L (ref 3.5–5.1)
PROTEIN TOTAL: 6.3 g/dL — ABNORMAL LOW (ref 6.4–8.9)
SODIUM: 135 mmol/L — ABNORMAL LOW (ref 136–145)

## 2022-10-31 LAB — CBC WITH DIFF
HCT: 42.8 % (ref 36.7–47.1)
HGB: 14.6 g/dL (ref 12.5–16.3)
MCH: 35.1 pg — ABNORMAL HIGH (ref 23.8–33.4)
MCHC: 34.1 g/dL (ref 32.5–36.3)
MCV: 102.9 fL — ABNORMAL HIGH (ref 73.0–96.2)
MPV: 7.6 fL (ref 7.4–11.4)
PLATELETS: 319 10*3/uL (ref 140–440)
RBC: 4.16 10*6/uL (ref 4.06–5.63)
RDW: 14.8 % (ref 12.1–16.2)
WBC: 12.5 10*3/uL — ABNORMAL HIGH (ref 3.6–10.2)

## 2022-10-31 LAB — ECG 12 LEAD
Atrial Rate: 90 {beats}/min
Calculated P Axis: 73 degrees
Calculated R Axis: 95 degrees
Calculated T Axis: 40 degrees
PR Interval: 184 ms
QRS Duration: 82 ms
QT Interval: 382 ms
QTC Calculation: 467 ms
Ventricular rate: 90 {beats}/min

## 2022-10-31 LAB — POC BLOOD GLUCOSE (RESULTS)
GLUCOSE, POC: 168 mg/dl — ABNORMAL HIGH (ref 70–100)
GLUCOSE, POC: 129 mg/dl — ABNORMAL HIGH (ref 70–100)
GLUCOSE, POC: 180 mg/dl — ABNORMAL HIGH (ref 70–100)

## 2022-10-31 LAB — DRUG SCREEN, NO CONFIRMATION, URINE
AMPHET QL: NEGATIVE
BARB QL: NEGATIVE
BENZO QL: NEGATIVE
BUP QL: NEGATIVE
CANNAQL: NEGATIVE
COCQL: NEGATIVE
FENTANYL, RANDOM URINE: NEGATIVE
METHQL: NEGATIVE
OPIATE: NEGATIVE
OXYCODONE URINE: NEGATIVE
PCP QL: NEGATIVE

## 2022-10-31 LAB — HGA1C (HEMOGLOBIN A1C WITH EST AVG GLUCOSE): HEMOGLOBIN A1C: 6 % (ref 4.0–6.0)

## 2022-10-31 LAB — MAGNESIUM
MAGNESIUM: 1.4 mg/dL — ABNORMAL LOW (ref 1.9–2.7)
MAGNESIUM: 1.4 mg/dL — ABNORMAL LOW (ref 1.9–2.7)

## 2022-10-31 LAB — LIGHT GREEN TOP TUBE

## 2022-10-31 LAB — LACTIC ACID - FIRST REFLEX
LACTIC ACID: 3.5 mmol/L — ABNORMAL HIGH (ref 0.5–2.2)
LACTIC ACID: 2.8 mmol/L — ABNORMAL HIGH (ref 0.5–2.2)

## 2022-10-31 LAB — LACTIC ACID LEVEL W/ REFLEX FOR LEVEL >2.0: LACTIC ACID: 3.2 mmol/L — ABNORMAL HIGH (ref 0.5–2.2)

## 2022-10-31 LAB — LACTIC ACID - SECOND REFLEX
LACTIC ACID: 3.3 mmol/L — ABNORMAL HIGH (ref 0.5–2.2)
LACTIC ACID: 2.8 mmol/L — ABNORMAL HIGH (ref 0.5–2.2)

## 2022-10-31 MED ORDER — GLUCAGON 1 MG/ML SOLUTION FOR INJECTION
1.0000 mg | Freq: Once | INTRAMUSCULAR | Status: DC | PRN
Start: 2022-10-31 — End: 2022-11-01

## 2022-10-31 MED ORDER — NICOTINE 21 MG/24 HR DAILY TRANSDERMAL PATCH
MEDICATED_PATCH | TRANSDERMAL | Status: AC
Start: 2022-10-31 — End: 2022-10-31
  Filled 2022-10-31: qty 1

## 2022-10-31 MED ORDER — TRAZODONE 50 MG TABLET
50.0000 mg | ORAL_TABLET | Freq: Every evening | ORAL | Status: DC | PRN
Start: 2022-10-31 — End: 2022-11-01

## 2022-10-31 MED ORDER — SODIUM CHLORIDE 0.9 % INTRAVENOUS SOLUTION
INTRAVENOUS | Status: DC
Start: 2022-10-31 — End: 2022-10-31
  Administered 2022-10-31: 0 mL via INTRAVENOUS

## 2022-10-31 MED ORDER — SPIRONOLACTONE 25 MG TABLET
50.0000 mg | ORAL_TABLET | Freq: Every day | ORAL | Status: DC
Start: 2022-10-31 — End: 2022-11-01
  Administered 2022-10-31 – 2022-11-01 (×2): 50 mg via ORAL
  Filled 2022-10-31 (×2): qty 2

## 2022-10-31 MED ORDER — PANTOPRAZOLE 40 MG TABLET,DELAYED RELEASE
40.0000 mg | DELAYED_RELEASE_TABLET | Freq: Every day | ORAL | Status: DC
Start: 2022-10-31 — End: 2022-11-01
  Administered 2022-10-31 – 2022-11-01 (×2): 40 mg via ORAL
  Filled 2022-10-31 (×2): qty 1

## 2022-10-31 MED ORDER — MAGNESIUM HYDROXIDE 400 MG/5 ML ORAL SUSPENSION
30.0000 mL | Freq: Every evening | ORAL | Status: DC | PRN
Start: 2022-10-31 — End: 2022-10-31

## 2022-10-31 MED ORDER — TAMSULOSIN 0.4 MG CAPSULE
ORAL_CAPSULE | ORAL | Status: AC
Start: 2022-10-31 — End: 2022-10-31
  Filled 2022-10-31: qty 1

## 2022-10-31 MED ORDER — AEROCHAMBER W/ FLOWSIGNAL SPACER
Freq: Once | Status: AC
Start: 2022-10-31 — End: 2022-10-31

## 2022-10-31 MED ORDER — BUDESONIDE-FORMOTEROL HFA 160 MCG-4.5 MCG/ACTUATION AEROSOL INHALER
2.0000 | INHALATION_SPRAY | Freq: Two times a day (BID) | RESPIRATORY_TRACT | Status: DC
Start: 2022-10-31 — End: 2022-11-01
  Administered 2022-10-31 – 2022-11-01 (×3): 2 via RESPIRATORY_TRACT

## 2022-10-31 MED ORDER — MAGNESIUM SULFATE 1 GRAM/100 ML IN DEXTROSE 5 % INTRAVENOUS PIGGYBACK
1.0000 g | INJECTION | INTRAVENOUS | Status: AC
Start: 2022-10-31 — End: 2022-10-31
  Administered 2022-10-31: 0 g via INTRAVENOUS
  Administered 2022-10-31: 1 g via INTRAVENOUS
  Administered 2022-10-31 (×2): 0 g via INTRAVENOUS
  Administered 2022-10-31 (×2): 1 g via INTRAVENOUS
  Filled 2022-10-31 (×2): qty 100

## 2022-10-31 MED ORDER — ACETAMINOPHEN 325 MG TABLET
650.0000 mg | ORAL_TABLET | ORAL | Status: DC | PRN
Start: 2022-10-31 — End: 2022-11-01

## 2022-10-31 MED ORDER — PYRIDOXINE (VITAMIN B6) 50 MG TABLET
100.0000 mg | ORAL_TABLET | Freq: Every day | ORAL | Status: DC
Start: 2022-10-31 — End: 2022-11-01
  Administered 2022-10-31 – 2022-11-01 (×2): 100 mg via ORAL
  Filled 2022-10-31 (×2): qty 2

## 2022-10-31 MED ORDER — METRONIDAZOLE 500 MG/100 ML IN SODIUM CHLOR(ISO) INTRAVENOUS PIGGYBACK
INJECTION | INTRAVENOUS | Status: AC
Start: 2022-10-31 — End: 2022-10-31
  Filled 2022-10-31: qty 100

## 2022-10-31 MED ORDER — NICOTINE 21 MG/24 HR DAILY TRANSDERMAL PATCH
21.0000 mg | MEDICATED_PATCH | TRANSDERMAL | Status: DC
Start: 2022-10-31 — End: 2022-10-31
  Administered 2022-10-31: 21 mg via TRANSDERMAL
  Filled 2022-10-31: qty 1

## 2022-10-31 MED ORDER — DEXTROSE 50 % IN WATER (D50W) INTRAVENOUS SYRINGE
12.5000 g | INJECTION | INTRAVENOUS | Status: DC | PRN
Start: 2022-10-31 — End: 2022-11-01

## 2022-10-31 MED ORDER — MAGNESIUM SULFATE 1 GRAM/100 ML IN DEXTROSE 5 % INTRAVENOUS PIGGYBACK
INJECTION | INTRAVENOUS | Status: AC
Start: 2022-10-31 — End: 2022-10-31
  Filled 2022-10-31: qty 100

## 2022-10-31 MED ORDER — FAMOTIDINE 40 MG TABLET
40.0000 mg | ORAL_TABLET | Freq: Two times a day (BID) | ORAL | Status: DC
Start: 2022-10-31 — End: 2022-11-01
  Administered 2022-10-31 – 2022-11-01 (×3): 40 mg via ORAL
  Filled 2022-10-31 (×3): qty 1

## 2022-10-31 MED ORDER — HEPARIN (PORCINE) 5,000 UNIT/ML INJECTION SOLUTION
5000.0000 [IU] | Freq: Three times a day (TID) | INTRAMUSCULAR | Status: DC
Start: 2022-10-31 — End: 2022-11-01
  Administered 2022-10-31: 0 [IU] via SUBCUTANEOUS
  Administered 2022-10-31 – 2022-11-01 (×3): 5000 [IU] via SUBCUTANEOUS
  Administered 2022-11-01: 0 [IU] via SUBCUTANEOUS
  Filled 2022-10-31 (×4): qty 1

## 2022-10-31 MED ORDER — MAGNESIUM OXIDE 400 MG (241.3 MG MAGNESIUM) TABLET
400.0000 mg | ORAL_TABLET | Freq: Two times a day (BID) | ORAL | Status: DC
Start: 2022-10-31 — End: 2022-11-01
  Administered 2022-10-31 – 2022-11-01 (×3): 400 mg via ORAL
  Filled 2022-10-31 (×3): qty 1

## 2022-10-31 MED ORDER — CHOLESTYRAMINE (WITH SUGAR) 4 GRAM POWDER FOR SUSP IN A PACKET
1.0000 | Freq: Every day | ORAL | Status: DC
Start: 2022-10-31 — End: 2022-11-01
  Administered 2022-10-31 – 2022-11-01 (×2): 1 via ORAL
  Filled 2022-10-31 (×2): qty 1

## 2022-10-31 MED ORDER — DEXTROSE 40 % ORAL GEL
15.0000 g | ORAL | Status: DC | PRN
Start: 2022-10-31 — End: 2022-11-01

## 2022-10-31 MED ORDER — FOLIC ACID 1 MG TABLET
1.0000 mg | ORAL_TABLET | Freq: Every day | ORAL | Status: DC
Start: 2022-10-31 — End: 2022-11-01
  Administered 2022-10-31 – 2022-11-01 (×2): 1 mg via ORAL
  Filled 2022-10-31 (×2): qty 1

## 2022-10-31 MED ORDER — TAMSULOSIN 0.4 MG CAPSULE
0.4000 mg | ORAL_CAPSULE | Freq: Every evening | ORAL | Status: DC
Start: 2022-10-31 — End: 2022-11-01
  Administered 2022-10-31 (×2): 0.4 mg via ORAL
  Filled 2022-10-31: qty 1

## 2022-10-31 MED ORDER — MAGNESIUM SULFATE 1 GRAM/100 ML IN DEXTROSE 5 % INTRAVENOUS PIGGYBACK
1.0000 g | INJECTION | Freq: Once | INTRAVENOUS | Status: AC
Start: 2022-10-31 — End: 2022-10-31
  Administered 2022-10-31: 0 g via INTRAVENOUS
  Administered 2022-10-31: 1 g via INTRAVENOUS
  Filled 2022-10-31: qty 100

## 2022-10-31 MED ORDER — INSULIN LISPRO 100 UNIT/ML SUB-Q SSIP VIAL
0.0000 [IU] | INJECTION | Freq: Four times a day (QID) | SUBCUTANEOUS | Status: DC
Start: 2022-10-31 — End: 2022-11-01
  Administered 2022-10-31: 0 [IU] via SUBCUTANEOUS
  Administered 2022-10-31 – 2022-11-01 (×4): 2 [IU] via SUBCUTANEOUS
  Filled 2022-10-31 (×4): qty 2

## 2022-10-31 MED ORDER — ALUMINUM-MAG HYDROXIDE-SIMETHICONE 200 MG-200 MG-20 MG/5 ML ORAL SUSP
30.0000 mL | ORAL | Status: DC | PRN
Start: 2022-10-31 — End: 2022-11-01

## 2022-10-31 MED ORDER — LEVOFLOXACIN 500 MG/100 ML IN 5 % DEXTROSE INTRAVENOUS PIGGYBACK
INJECTION | INTRAVENOUS | Status: AC
Start: 2022-10-31 — End: 2022-10-31
  Filled 2022-10-31: qty 100

## 2022-10-31 MED ORDER — NICOTINE 21 MG/24 HR DAILY TRANSDERMAL PATCH
21.0000 mg | MEDICATED_PATCH | Freq: Every day | TRANSDERMAL | Status: DC
Start: 2022-10-31 — End: 2022-10-31
  Administered 2022-10-31: 0 mg via TRANSDERMAL

## 2022-10-31 MED ORDER — NICOTINE 21 MG/24 HR DAILY TRANSDERMAL PATCH
42.0000 mg | MEDICATED_PATCH | Freq: Every day | TRANSDERMAL | Status: DC
Start: 2022-11-01 — End: 2022-10-31

## 2022-10-31 MED ORDER — MESALAMINE ER 0.375 GRAM CAPSULE,EXTENDED RELEASE 24 HR
1.5000 g | ORAL_CAPSULE | Freq: Every day | ORAL | Status: DC
Start: 2022-10-31 — End: 2022-11-01
  Administered 2022-10-31 – 2022-11-01 (×2): 0 g via ORAL

## 2022-10-31 MED ORDER — THIAMINE MONONITRATE (VITAMIN B1) 100 MG TABLET
100.0000 mg | ORAL_TABLET | Freq: Every day | ORAL | Status: DC
Start: 2022-10-31 — End: 2022-11-01
  Administered 2022-10-31 – 2022-11-01 (×2): 100 mg via ORAL
  Filled 2022-10-31 (×2): qty 1

## 2022-10-31 MED ORDER — IOHEXOL 350 MG IODINE/ML INTRAVENOUS SOLUTION
100.0000 mL | INTRAVENOUS | Status: AC
Start: 2022-10-31 — End: 2022-10-31
  Administered 2022-10-31: 85 mL via INTRAVENOUS

## 2022-10-31 MED ORDER — POTASSIUM CHLORIDE ER 20 MEQ TABLET,EXTENDED RELEASE(PART/CRYST)
40.0000 meq | ORAL_TABLET | ORAL | Status: AC
Start: 2022-10-31 — End: 2022-10-31
  Administered 2022-10-31: 40 meq via ORAL
  Filled 2022-10-31: qty 2

## 2022-10-31 MED ORDER — LEVOFLOXACIN 500 MG/100 ML IN 5 % DEXTROSE INTRAVENOUS PIGGYBACK
500.0000 mg | INJECTION | INTRAVENOUS | Status: DC
Start: 2022-10-31 — End: 2022-11-01
  Administered 2022-10-31: 0 mg via INTRAVENOUS
  Administered 2022-10-31 – 2022-11-01 (×2): 500 mg via INTRAVENOUS
  Administered 2022-11-01: 0 mg via INTRAVENOUS
  Filled 2022-10-31: qty 100

## 2022-10-31 MED ORDER — NICOTINE 21 MG/24 HR DAILY TRANSDERMAL PATCH
42.0000 mg | MEDICATED_PATCH | Freq: Every day | TRANSDERMAL | Status: DC
Start: 2022-10-31 — End: 2022-11-01
  Administered 2022-10-31: 42 mg via TRANSDERMAL
  Filled 2022-10-31: qty 2

## 2022-10-31 MED ORDER — METRONIDAZOLE 500 MG/100 ML IN SODIUM CHLOR(ISO) INTRAVENOUS PIGGYBACK
500.0000 mg | INJECTION | Freq: Two times a day (BID) | INTRAVENOUS | Status: DC
Start: 2022-10-31 — End: 2022-11-01
  Administered 2022-10-31: 0 mg via INTRAVENOUS
  Administered 2022-10-31: 500 mg via INTRAVENOUS
  Administered 2022-10-31: 0 mg via INTRAVENOUS
  Administered 2022-10-31 – 2022-11-01 (×2): 500 mg via INTRAVENOUS
  Administered 2022-11-01: 0 mg via INTRAVENOUS
  Filled 2022-10-31 (×2): qty 100

## 2022-10-31 NOTE — Respiratory Therapy (Signed)
Transported CPAP to [facility]. Per nursing patient is refusing to wear CPAP this AM. CPAP is at bedside.

## 2022-10-31 NOTE — ED Nurses Note (Signed)
Report was called to [place], [place]

## 2022-10-31 NOTE — Respiratory Therapy (Signed)
Patient requesting to hold off on CPAP until he moves upstairs.

## 2022-10-31 NOTE — H&P (Signed)
[place] MEDICINE [facility]    HOSPITALIST H&P    [name] 47 y.o. male ED22/ED22   Date of Service: 10/31/2022    Date of Admission:  10/30/2022   PCP: Mariah Milling, DO Code Status:Prior       Chief Complaint: diarrhea, abnormal labs     HPI:   This 47 year old white male presents hospital service to the ED with increased diarrhea over last 3 days.  He has known history of ulcerative colitis, states that he does not think that has been occasions working in a longer.  He was supposed to see Dr. Concha Se in the office over the next week or 2 for follow-up.  Patient reports no fever, chills, sweats, but has had some abdominal distention and increased diarrhea without blood noted.  He was evaluated by his PCP, and found to have elevated white blood count as well as a low potassium.  He was advised to come to the ED for evaluation 2 days ago, however did not come until today.  He was found have a white count of 16[address], his potassium was 2.5, was corrected in the ED, his lactate was noted to be elevated at 5.5, after fluid bolus, did come down to 3.8, then back up to 4.0.  Patient's magnesium was also noted to be 1.4.  Patient did have CT scan of the abdomen pelvis performed that did show mural thickening of the rectosigmoid compatible with ulcerative colitis.  The patient also has numerous other complaints including weak urination, prediabetes, and states he was told he to come here by his PCP to test his testosterone level.  I do feel this is more of a PCP issue.  We will start patient on Flomax while here, and we will admit him for GI evaluation, antibiotics, fluids, and further evaluation and treatment.  Have asked if patient would take a course of steroids and at this time he is refusing, stating that he has arthralgias with them every time.  The patient also reports that he has been having some swelling of lower extremities, we will back off on his IV fluids for now to 75 cc an hour, but  will go backup if lactate does not continue to improve.    ED medications:   Medications Administered in the ED   NS bolus infusion 500 mL (0 mL Intravenous Not Given 10/30/22 2000)   NS premix infusion (has no administration in time range)   NS bolus infusion 2,000 mL (0 mL Intravenous Stopped 10/30/22 2109)   potassium bicarbonate-citric acid (EFFER-K) effervescent tablet (50 mEq Oral Given 10/30/22 1959)   potassium chloride 20 mEq in SW 100 mL premix infusion (0 mEq Intravenous Stopped 10/30/22 2109)         PMHx:    Past Medical History:   Diagnosis Date    Asthma     Bleeding hemorrhoid     Bleeding ulcer     Cervical herniated disc     COPD (chronic obstructive pulmonary disease) (CMS HCC)     GERD (gastroesophageal reflux disease)     Hx MRSA infection     Hyperlipidemia     Hypertension     Rotator cuff arthropathy, right     Ulcerative colitis (CMS HCC)     PSHx:   Past Surgical History:   Procedure Laterality Date    HX CYST REMOVAL      lower back    HX HERNIA REPAIR  HX TONSILLECTOMY         Allergies:    Allergies   Allergen Reactions    Noctec [Chloral Hydrate] Anaphylaxis    Penicillins Anaphylaxis    Theophylline Shortness of Breath    Flexeril [Cyclobenzaprine] Mental Status Effect     Homicidal ideation    Prednisone Myalgia    Social History  Social History     Tobacco Use    Smoking status: Every Day     Current packs/day: 1.00     Average packs/day: 1 pack/day for 30.0 years (30.0 ttl pk-yrs)     Types: Cigarettes    Smokeless tobacco: Never   Vaping Use    Vaping status: Never Used   Substance Use Topics    Alcohol use: Yes     Alcohol/week: 8.0 standard drinks of alcohol     Types: 8 Cans of beer per week     Comment: occasional    Drug use: Never       Family History  Family Medical History:       Problem Relation (Age of Onset)    Anesthesia Complications Father    Arthritis-osteo Mother, Father    Asthma Mother, Father, Brother, Paternal Grandmother, Paternal Grandfather    Blood Clots  Father, Paternal Grandmother, Paternal Grandfather    Cancer Mother, Father    Congestive Heart Failure Father, Paternal Grandmother, Paternal Grandfather    Coronary Artery Disease Father, Paternal Grandmother, Paternal Grandfather    Diabetes Mother, Father    Heart Attack Father, Paternal Grandmother, Paternal Grandfather    High Cholesterol Mother, Father    Hypertension (High Blood Pressure) Mother, Father    Stroke Father, Maternal Grandmother, Maternal Grandfather, Paternal Grandmother, Paternal Grandfather               Home Meds:      Prior to Admission medications    Medication Sig Start Date End Date Taking? Authorizing Provider   albuterol sulfate (PROVENTIL OR VENTOLIN OR PROAIR) 90 mcg/actuation Inhalation oral inhaler Take 1-2 Puffs by inhalation Every 6 hours as needed    Provider, Historical   BROMPHENIR-DIPHENHYD-PHENYLEPH ORAL Take by mouth    Provider, Historical   budesonide-formoteroL (SYMBICORT) 160-4.5 mcg/actuation Inhalation oral inhaler Take 2 Puffs by inhalation Twice daily    Provider, Historical   cholestyramine-sucrose (QUESTRAN) 4 gram Oral Powder in Packet Take 1 Packet by mouth Once a day 11/08/21   Provider, Historical   famotidine (PEPCID) 40 mg Oral Tablet Take 1 Tablet (40 mg total) by mouth Twice daily    Provider, Historical   levoFLOXacin (LEVAQUIN) 750 mg Oral Tablet Take 1 Tablet (750 mg total) by mouth Once a day    Provider, Historical   Mesalamine (APRISO) 0.375 gram Oral Capsule, Sust. Release 24 hr Take 4 Capsules (1.5 g total) by mouth Once a day    Provider, Historical   omeprazole (PRILOSEC) 40 mg Oral Capsule, Delayed Release(E.C.) Take 1 Capsule (40 mg total) by mouth Once a day    Provider, Historical   Pseudoephedrine-Guaifenesin (MUCINEX D) 60-600 mg Oral Tablet Sustained Release 12 hr Take 1 Tablet by mouth Every 12 hours    Provider, Historical   spironolactone (ALDACTONE) 50 mg Oral Tablet Take 1 Tablet (50 mg total) by mouth Once a day 10/16/21   Provider,  Historical          ROS:   General: No fever or chills. No weight changes, fatigue, weakness.   HEENT: No headaches, dizziness, changes in  vision, changes in hearing, or difficulty swallowing.    Skin:  No rashes, erythema or bruises.   Cardiac: No chest pain, palpitations, or arrhythmia.    Respiratory: No shortness of breath, cough, or wheezing.  GI:  Nausea, no vomiting, positive for increased diarrhea over last 3 days.  Abdominal distention with little pain.  Urinary: No dysuria, hematuria, or change in frequency.  Urinary hesitancy present, as well as weak stream.    Vascular:  Chronic pedal edema.     Musculoskeletal: No muscle weakness, pain, or decreased range of motion.   Neurologic: No loss of sensation, numbness or tingling.   Endocrine: No heat or cold intolerance or polydipsia.   Psychiatric: No insomnia, depression or anxiety.      Results for orders placed or performed during the hospital encounter of 10/30/22 (from the past 24 hour(s))   COMPREHENSIVE METABOLIC PANEL, NON-FASTING   Result Value Ref Range    SODIUM 136 136 - 145 mmol/L    POTASSIUM 2.5 (LL) 3.5 - 5.1 mmol/L    CHLORIDE 93 (L) 98 - 107 mmol/L    CO2 TOTAL 25 21 - 31 mmol/L    ANION GAP 18 (H) 4 - 13 mmol/L    BUN 3 (L) 7 - 25 mg/dL    CREATININE 0.98 (L) 0.60 - 1.30 mg/dL    BUN/CREA RATIO 5 (L) 6 - 22    ESTIMATED GFR 123 >59 mL/min/1.64m^2    ALBUMIN 3.7 3.5 - 5.7 g/dL    CALCIUM 9.1 8.6 - 11.9 mg/dL    GLUCOSE 147 (H) 74 - 109 mg/dL    ALKALINE PHOSPHATASE 133 (H) 34 - 104 U/L    ALT (SGPT) 57 (H) 7 - 52 U/L    AST (SGOT) 111 (H) 13 - 39 U/L    BILIRUBIN TOTAL 1.4 (H) 0.3 - 1.2 mg/dL    PROTEIN TOTAL 6.9 6.4 - 8.9 g/dL    ALBUMIN/GLOBULIN RATIO 1.2 0.8 - 1.4    OSMOLALITY, CALCULATED 274 270 - 290 mOsm/kg    CALCIUM, CORRECTED 9.3 8.9 - 10.8 mg/dL    GLOBULIN 3.2 2.9 - 5.4   ETHANOL, SERUM/PLASMA   Result Value Ref Range    ETHANOL 196 (H) 0 mg/dL   CBC WITH DIFF   Result Value Ref Range    WBC 15.1 (H) 3.6 - 10.2 x10^3/uL    RBC  4.44 4.06 - 5.63 x10^6/uL    HGB 15.9 12.5 - 16.3 g/dL    HCT 82.9 56.2 - 13.0 %    MCV 104.3 (H) 73.0 - 96.2 fL    MCH 35.8 (H) 23.8 - 33.4 pg    MCHC 34.3 32.5 - 36.3 g/dL    RDW 86.5 78.4 - 69.6 %    PLATELETS 338 140 - 440 x10^3/uL    MPV 7.8 7.4 - 11.4 fL    NEUTROPHIL % 64 43 - 77 %    LYMPHOCYTE % 24 16 - 44 %    MONOCYTE % 9 5 - 13 %    EOSINOPHIL % 2 %    BASOPHIL % 1 0 - 1 %    NEUTROPHIL # 9.60 (H) 1.85 - 7.80 x10^3/uL    LYMPHOCYTE # 3.60 (H) 1.00 - 3.00 x10^3/uL    MONOCYTE # 1.40 (H) 0.30 - 1.00 x10^3/uL    EOSINOPHIL # 0.30 0.00 - 0.50 x10^3/uL    BASOPHIL # 0.20 (H) 0.00 - 0.10 x10^3/uL   TROPONIN NOW   Result Value Ref Range  TROPONIN I 11 <20 ng/L   LIPASE   Result Value Ref Range    LIPASE 18 11 - 82 U/L   BETA-HYDROXYBUTYRATE, PLASMA OR SERUM   Result Value Ref Range    BETA-HYDROXYBUTYRATE 0.12 0.02 - 0.29 mmol/L   POC BLOOD GLUCOSE (RESULTS)   Result Value Ref Range    GLUCOSE, POC 198 (H) 70 - 100 mg/dl   DRUG SCREEN, NO CONFIRMATION, URINE   Result Value Ref Range    AMPHET QL Negative Negative    BARB QL Negative Negative    BENZO QL Negative Negative    BUP QL      CANNAQL Negative Negative    COCQL Negative Negative    FENTANYL, RANDOM URINE Negative Negative    OPIATE Negative Negative    OXYCODONE URINE Negative Negative    PCP QL Negative Negative    METHQL Negative Negative   URINALYSIS, MACROSCOPIC   Result Value Ref Range    COLOR Light Yellow Colorless, Light Yellow, Yellow    APPEARANCE Clear Clear    SPECIFIC GRAVITY 1.005 1.002 - 1.030    PH 6.5 5.0 - 9.0    LEUKOCYTES Negative Negative, 100  WBCs/uL    NITRITE Negative Negative    PROTEIN Negative Negative, 10 , 20  mg/dL    GLUCOSE Negative Negative, 30  mg/dL    KETONES Negative Negative, Trace mg/dL    BILIRUBIN Negative Negative, 0.5 mg/dL    BLOOD Negative Negative, 0.03 mg/dL    UROBILINOGEN 2 (A) Normal mg/dL   URINALYSIS, MICROSCOPIC   Result Value Ref Range    RBCS      WBCS <1 <6 /hpf   VENOUS BLOOD GAS/LACTATE    Result Value Ref Range    PH (VENOUS) 7.44 (H) 7.31 - 7.41    PCO2 (VENOUS) 42 41 - 51 mm/Hg    PO2 (VENOUS) 62 (H) 35 - 50 mm/Hg    BICARBONATE (VENOUS) 27.2 (H) 22.0 - 26.0 mmol/L    BASE EXCESS 4.4 (H) 0.0 - 2.0 mmol/L    LACTATE 5.5 (HH) <=2.0 mmol/L    OXYHEMOGLOBIN 71.6 %    CARBOXYHEMOGLOBIN 7.2 (H) <=1.5 %    MET-HEMOGLOBIN 0.1 <=2.0 %    %FIO2 (VENOUS) 21.0 %    O2CT 16.0 %   TROPONIN IN ONE HOUR   Result Value Ref Range    TROPONIN I 14 <20 ng/L   LACTIC ACID - FIRST REFLEX   Result Value Ref Range    LACTIC ACID 5.5 (HH) 0.5 - 2.2 mmol/L   TROPONIN IN THREE HOURS   Result Value Ref Range    TROPONIN I 11 <20 ng/L   LACTIC ACID - SECOND REFLEX   Result Value Ref Range    LACTIC ACID 3.8 (H) 0.5 - 2.2 mmol/L   BASIC METABOLIC PANEL   Result Value Ref Range    SODIUM 137 136 - 145 mmol/L    POTASSIUM 3.5 3.5 - 5.1 mmol/L    CHLORIDE 99 98 - 107 mmol/L    CO2 TOTAL 27 21 - 31 mmol/L    ANION GAP 11 4 - 13 mmol/L    CALCIUM 8.2 (L) 8.6 - 10.3 mg/dL    GLUCOSE 161 (H) 74 - 109 mg/dL    BUN 2 (L) 7 - 25 mg/dL    CREATININE 0.96 0.45 - 1.30 mg/dL    BUN/CREA RATIO 3 (L) 6 - 22    ESTIMATED GFR 121 >59 mL/min/1.87m^2  OSMOLALITY, CALCULATED 272 270 - 290 mOsm/kg   LACTIC ACID LEVEL W/ REFLEX FOR LEVEL >2.0   Result Value Ref Range    LACTIC ACID 4.0 (H) 0.5 - 2.2 mmol/L   LACTIC ACID - FIRST REFLEX   Result Value Ref Range    LACTIC ACID 3.5 (H) 0.5 - 2.2 mmol/L   MAGNESIUM - ONCE   Result Value Ref Range    MAGNESIUM 1.4 (L) 1.9 - 2.7 mg/dL          Physical:  Filed Vitals:    10/31/22 0045 10/31/22 0100 10/31/22 0130 10/31/22 0145   BP:       Pulse: 95 94 99 96   Resp: 20 18 18  (!) 22   Temp:       SpO2: 95% 91% 94% 95%      General: Patient is alert and oriented to person, place, and time. No acute distress. Communicates appropriately.   Head: Normocephalic and atraumatic.    Eyes: Pupils equally round and react to light and accommodate. Extraocular movements intact.  Conjunctiva normal. Sclerae are normal.     Nose: Nasal passages clear. Mucosa moist.    Throat: Moist oral mucosa. No erythema or exudate of the pharynx. Clear oropharynx.    Neck: Supple. No cervical lymphadenopathy or supraclavicular nodes detected. Trachea midline   Heart: Regular rate and rhythm. S1 & S2 present. No S3 or S4. No rubs, gallops, or murmurs appreciated.   Lungs: Clear to auscultation bilaterally with no wheezes or rales. Equal chest excursion.  No conversational dyspnea. No respiratory distress noted.   Abdomen: Soft, nontender, distended belly. Bowel sounds are present in all four quadrants. No rigidity.  No guarding.  No ascites.   Extremities: No cyanosis, or clubbing. Grossly moves all extremities.  Trace edema bilateral lower extremities to just above the ankle.  Skin: Warm and dry without lesions. No ecchymosis noted.    Neurologic: Cranial nerves II through XII are grossly intact.  Genitourinary:  No urinary incontinence or Foley catheter   Psychiatric: Judgment and insight are intact. Mood and affect are appropriate for the situation.       Diagnostic studies:  No results found.       EKG interpretation:     @PEVF @    Assessment:  Active Hospital Problems   (*Primary Problem)    Diagnosis    *Ulcerative colitis (CMS HCC)    Hypomagnesemia    Alcohol abuse    Urinary hesitancy    Lactic acidosis    Hypokalemia       Plan:  Patient will be placed in observation for the above problems.  1. Ulcerative colitis:  Will start patient on Levaquin, Flagyl, and IV fluids.  He was refusing steroid course at this time, and we will ask for GI consultation.  We will await culture results, repeat labs daily.    2. Hypomagnesemia:  Will replace    3. Alcohol abuse:  The patient states that he drinks number of beers a day.  His alcohol level was 190.  He will be placed on CIWA protocol.    4. Urinary hesitancy: Patient will be started on Flomax here.  No BPH noted on CT scan.    5. Lactic acidosis:  Patient will be continued on IV fluids here.  We  will repeat level in a.m..    6. Hypokalemia:  Resolved.  Replaced while in the ED.  We will continue to follow levels while admitted.  Further interventions will be based on patient's clinical course.  I have already told him he will need to be referred back to his PCP for testosterone levels.  He will have evaluation by GI, and we will repeat labs now and in a.m.Marland Kitchen  Also add hemoglobin A1c, as patient does state that he was prediabetic, but his glucose initially was 197 today.  The hospitalist has examined patient, and reviewed all material, and agrees with the above medical management at this time.      DVT prophylaxis:  Heparin      Nolen Mu, PA-C    [facility] MEDICINE HOSPITALIST

## 2022-10-31 NOTE — Progress Notes (Signed)
[facility]               IP PROGRESS NOTE      [name], George Baum  Date of Admission:  10/30/2022  Date of Birth:  12/01/1975  Date of Service:  10/31/2022    Hospital Day:  LOS: 0 days     Subjective:   [name] is a 47 y/o male with known history of UC who is being seen in f/u for diarrhea/UC flare. Patient is a patient of Dr Concha Se but is apparently noncompliant .  He states that initially the medications took him from 12-15 loose stools daily to 7-8 but they are no longer working. He has had 8 mucous like  loose stools since 4pm yesterday. He is also requesting to increase his nicotine patch.       Vital Signs:  Temp (24hrs) Max:36.9 C (98.5 F)      Temperature: 36.7 C (98 F)  BP (Non-Invasive): (!) 147/93  Heart Rate: 92  Respiratory Rate: 18  SpO2: 90 %    Current Medications:  acetaminophen (TYLENOL) tablet, 650 mg, Oral, Q4H PRN  aluminum-magnesium hydroxide-simethicone (MAG-AL PLUS) 200-200-20 mg per 5 mL oral liquid, 30 mL, Oral, Q4H PRN  budesonide-formoterol (SYMBICORT) 160 mcg-4.5 mcg per inhalation oral inhaler - "Respiratory to administer", 2 Puff, Inhalation, 2x/day  cholestyramine-sucrose (QUESTRAN) 4 gram packet, 1 Packet, Oral, Daily  Correction/SSIP insulin lispro (HumaLOG) 100 units/mL injection, 0-18 Units, Subcutaneous, 4x/day AC  dextrose (GLUTOSE) 40% oral gel, 15 g, Oral, Q15 Min PRN  dextrose 50% (0.5 g/mL) injection - syringe, 12.5 g, Intravenous, Q15 Min PRN  famotidine (PEPCID) tablet, 40 mg, Oral, 2x/day  folic acid (FOLVITE) tablet, 1 mg, Oral, Daily  glucagon (GLUCAGEN DIAGNOSTIC KIT) injection 1 mg, 1 mg, IntraMUSCULAR, Once PRN  heparin 5,000 unit/mL injection, 5,000 Units, Subcutaneous, Q8HRS  levoFLOXacin (LEVAQUIN) 500 mg in D5W 100 mL premix IVPB, 500 mg, Intravenous, Q24H  magnesium oxide (MAG-OX) 400mg  (241.3 mg elemental magnesium) tablet, 400 mg, Oral, 2x/day  magnesium sulfate 1 G in D5W 100 mL premix IVPB, 1 g, Intravenous,  Once  magnesium sulfate 1 G in D5W 100 mL premix IVPB, 1 g, Intravenous, Once  Mesalamine Capsule, Sust. Release 24 hr 1.5 g, 1.5 g, Oral, Daily  metroNIDAZOLE (FLAGYL) 500 mg in NS 100 mL premix IVPB, 500 mg, Intravenous, Q12H  nicotine (NICODERM CQ) transdermal patch (mg/24 hr), 21 mg, Transdermal, Now  [START ON 11/01/2022] nicotine (NICODERM CQ) transdermal patch (mg/24 hr), 42 mg, Transdermal, Daily  pantoprazole (PROTONIX) delayed release tablet, 40 mg, Oral, Daily  potassium chloride (K-DUR) extended release tablet, 40 mEq, Oral, Now  pyridOXINE Vitamin B6 tablet, 100 mg, Oral, Daily  spironolactone (ALDACTONE) tablet, 50 mg, Oral, Daily  tamsulosin (FLOMAX) capsule, 0.4 mg, Oral, Daily after Dinner  thiamine-vitamin B1 tablet, 100 mg, Oral, Daily  traZODone (DESYREL) tablet, 50 mg, Oral, HS PRN - MR x 1        Current Orders:  Active Orders   Lab    CBC     Frequency: ONE TIME     Number of Occurrences: 1 Occurrences    CBC     Frequency: ONE TIME     Number of Occurrences: 1 Occurrences    CBC     Frequency: ONE TIME     Number of Occurrences: 1 Occurrences    COMPREHENSIVE METABOLIC PANEL, NON-FASTING     Frequency: ONE TIME     Number of Occurrences: 1  Occurrences    COMPREHENSIVE METABOLIC PANEL, NON-FASTING     Frequency: ONE TIME     Number of Occurrences: 1 Occurrences    COMPREHENSIVE METABOLIC PANEL, NON-FASTING     Frequency: ONE TIME     Number of Occurrences: 1 Occurrences    MAGNESIUM     Frequency: ONE TIME     Number of Occurrences: 1 Occurrences    MAGNESIUM     Frequency: ONE TIME     Number of Occurrences: 1 Occurrences    MAGNESIUM     Frequency: ONE TIME     Number of Occurrences: 1 Occurrences   Diet    DIET REGULAR Do you want to initiate MNT Protocol? Yes     Frequency: All Meals     Number of Occurrences: 1 Occurrences   Nursing    HYPOGLYCEMIA MANAGEMENT - CONSCIOUS PATIENT W/DIET ORDER     Frequency: UNTIL DISCONTINUED     Number of Occurrences: Until Specified    HYPOGLYCEMIA  MANAGEMENT - UNCONSCIOUS/ALTERED/NPO PATIENT     Frequency: UNTIL DISCONTINUED     Number of Occurrences: Until Specified    HYPOGLYCEMIA TREATMENT ALGORITHM     Frequency: UNTIL DISCONTINUED     Number of Occurrences: Until Specified    TELEMETRY MONITORING - Continuous     Frequency: CONTINUOUS     Number of Occurrences: Until Specified   Consult    IP CONSULT TO GASTROENTEROLOGY Requested Provider; Rogers Seeds     Frequency: ONE TIME     Number of Occurrences: 1 Occurrences    IP CONSULT TO PALLIATIVE CARE     Frequency: ONE TIME     Number of Occurrences: 1 Occurrences   Respiratory Care    CPAP - ADULT Q HS (2200)     Frequency: Q HS (2200)     Number of Occurrences: Until Specified     Order Comments: Titrate for comfort     [facility]    CIWA-AR ASSESSMENT     Frequency: Q2H     Number of Occurrences: Until Specified    CIWA-AR ASSESSMENT     Frequency: Q2H     Number of Occurrences: Until Specified    CIWA-AR ASSESSMENT     Frequency: Q4H     Number of Occurrences: Until Specified   Point of Care Testing    PERFORM POC WHOLE BLOOD GLUCOSE     Frequency: ONE TIME     Number of Occurrences: 1 Occurrences   Medications    acetaminophen (TYLENOL) tablet     Frequency: Q4H PRN     Dose: 650 mg     Route: Oral    aluminum-magnesium hydroxide-simethicone (MAG-AL PLUS) 200-200-20 mg per 5 mL oral liquid     Frequency: Q4H PRN     Dose: 30 mL     Route: Oral    budesonide-formoterol (SYMBICORT) 160 mcg-4.5 mcg per inhalation oral inhaler - "Respiratory to administer"     Frequency: 2x/day     Dose: 2 Puff     Route: Inhalation    cholestyramine-sucrose (QUESTRAN) 4 gram packet     Frequency: Daily     Dose: 1 Packet     Route: Oral    Correction/SSIP insulin lispro (HumaLOG) 100 units/mL injection     Frequency: 4x/day AC     Dose: 0-18 Units     Route: Subcutaneous    dextrose (GLUTOSE) 40% oral gel     Frequency: Q15 Min PRN  Dose: 15 g     Route: Oral    dextrose 50% (0.5 g/mL) injection -  syringe     Frequency: Q15 Min PRN     Dose: 12.5 g     Route: Intravenous    famotidine (PEPCID) tablet     Frequency: 2x/day     Dose: 40 mg     Route: Oral    folic acid (FOLVITE) tablet     Frequency: Daily     Dose: 1 mg     Route: Oral    glucagon (GLUCAGEN DIAGNOSTIC KIT) injection 1 mg     Frequency: Once PRN     Dose: 1 mg     Route: IntraMUSCULAR    heparin 5,000 unit/mL injection     Frequency: Q8HRS     Dose: 5,000 Units     Route: Subcutaneous    levoFLOXacin (LEVAQUIN) 500 mg in D5W 100 mL premix IVPB     Frequency: Q24H     Dose: 500 mg     Route: Intravenous    magnesium oxide (MAG-OX) 400mg  (241.3 mg elemental magnesium) tablet     Frequency: 2x/day     Dose: 400 mg     Route: Oral    magnesium sulfate 1 G in D5W 100 mL premix IVPB     Frequency: Once     Dose: 1 g     Route: Intravenous    magnesium sulfate 1 G in D5W 100 mL premix IVPB     Frequency: Once     Dose: 1 g     Route: Intravenous    Mesalamine Capsule, Sust. Release 24 hr 1.5 g     Frequency: Daily     Dose: 1.5 g     Route: Oral    metroNIDAZOLE (FLAGYL) 500 mg in NS 100 mL premix IVPB     Frequency: Q12H     Dose: 500 mg     Route: Intravenous    nicotine (NICODERM CQ) transdermal patch (mg/24 hr)     Frequency: Now     Dose: 21 mg     Route: Transdermal    nicotine (NICODERM CQ) transdermal patch (mg/24 hr)     Frequency: Daily     Dose: 42 mg     Route: Transdermal    pantoprazole (PROTONIX) delayed release tablet     Frequency: Daily     Dose: 40 mg     Route: Oral    potassium chloride (K-DUR) extended release tablet     Frequency: Now     Dose: 40 mEq     Route: Oral    pyridOXINE Vitamin B6 tablet     Frequency: Daily     Dose: 100 mg     Route: Oral    spironolactone (ALDACTONE) tablet     Frequency: Daily     Dose: 50 mg     Route: Oral    tamsulosin (FLOMAX) capsule     Frequency: Daily after Dinner     Dose: 0.4 mg     Route: Oral    thiamine-vitamin B1 tablet     Frequency: Daily     Dose: 100 mg     Route: Oral     traZODone (DESYREL) tablet     Frequency: HS PRN - MR x 1     Dose: 50 mg     Route: Oral        Review of Systems:  Focused review of system was  completed. Refer to the HPI for ROS details.     Today's Physical Exam:  Physical Exam  Constitutional:       General: He is not in acute distress.     Appearance: He is normal weight. He is not ill-appearing.   HENT:      Head: Normocephalic and atraumatic.   Eyes:      Pupils: Pupils are equal, round, and reactive to light.   Cardiovascular:      Rate and Rhythm: Normal rate and regular rhythm.      Heart sounds: Normal heart sounds.   Pulmonary:      Effort: No respiratory distress.      Breath sounds: Normal breath sounds. No wheezing.   Abdominal:      General: Abdomen is flat. Bowel sounds are normal. There is no distension.      Palpations: Abdomen is soft.      Tenderness: There is abdominal tenderness.   Musculoskeletal:      Right lower leg: No edema.      Left lower leg: No edema.   Skin:     General: Skin is warm.   Neurological:      Mental Status: He is alert and oriented to person, place, and time.   Psychiatric:         Mood and Affect: Mood normal.         Thought Content: Thought content normal.        I/O:  I/O last 24 hours:    Intake/Output Summary (Last 24 hours) at 10/31/2022 1054  Last data filed at 10/31/2022 1006  Gross per 24 hour   Intake 2951.67 ml   Output --   Net 2951.67 ml     I/O current shift:  05/13 0700 - 05/13 1859  In: 360 [P.O.:360]  Out: -   Labs:  Reviewed: I have reviewed all lab results.  Lab Results Today:    Results for orders placed or performed during the hospital encounter of 10/30/22 (from the past 24 hour(s))   COMPREHENSIVE METABOLIC PANEL, NON-FASTING   Result Value Ref Range    SODIUM 136 136 - 145 mmol/L    POTASSIUM 2.5 (LL) 3.5 - 5.1 mmol/L    CHLORIDE 93 (L) 98 - 107 mmol/L    CO2 TOTAL 25 21 - 31 mmol/L    ANION GAP 18 (H) 4 - 13 mmol/L    BUN 3 (L) 7 - 25 mg/dL    CREATININE 4.69 (L) 0.60 - 1.30 mg/dL    BUN/CREA  RATIO 5 (L) 6 - 22    ESTIMATED GFR 123 >59 mL/min/1.40m^2    ALBUMIN 3.7 3.5 - 5.7 g/dL    CALCIUM 9.1 8.6 - 62.9 mg/dL    GLUCOSE 528 (H) 74 - 109 mg/dL    ALKALINE PHOSPHATASE 133 (H) 34 - 104 U/L    ALT (SGPT) 57 (H) 7 - 52 U/L    AST (SGOT) 111 (H) 13 - 39 U/L    BILIRUBIN TOTAL 1.4 (H) 0.3 - 1.2 mg/dL    PROTEIN TOTAL 6.9 6.4 - 8.9 g/dL    ALBUMIN/GLOBULIN RATIO 1.2 0.8 - 1.4    OSMOLALITY, CALCULATED 274 270 - 290 mOsm/kg    CALCIUM, CORRECTED 9.3 8.9 - 10.8 mg/dL    GLOBULIN 3.2 2.9 - 5.4   ETHANOL, SERUM/PLASMA   Result Value Ref Range    ETHANOL 196 (H) 0 mg/dL   CBC WITH DIFF   Result Value Ref  Range    WBC 15.1 (H) 3.6 - 10.2 x10^3/uL    RBC 4.44 4.06 - 5.63 x10^6/uL    HGB 15.9 12.5 - 16.3 g/dL    HCT 16.1 09.6 - 04.5 %    MCV 104.3 (H) 73.0 - 96.2 fL    MCH 35.8 (H) 23.8 - 33.4 pg    MCHC 34.3 32.5 - 36.3 g/dL    RDW 40.9 81.1 - 91.4 %    PLATELETS 338 140 - 440 x10^3/uL    MPV 7.8 7.4 - 11.4 fL    NEUTROPHIL % 64 43 - 77 %    LYMPHOCYTE % 24 16 - 44 %    MONOCYTE % 9 5 - 13 %    EOSINOPHIL % 2 %    BASOPHIL % 1 0 - 1 %    NEUTROPHIL # 9.60 (H) 1.85 - 7.80 x10^3/uL    LYMPHOCYTE # 3.60 (H) 1.00 - 3.00 x10^3/uL    MONOCYTE # 1.40 (H) 0.30 - 1.00 x10^3/uL    EOSINOPHIL # 0.30 0.00 - 0.50 x10^3/uL    BASOPHIL # 0.20 (H) 0.00 - 0.10 x10^3/uL   TROPONIN NOW   Result Value Ref Range    TROPONIN I 11 <20 ng/L   LIPASE   Result Value Ref Range    LIPASE 18 11 - 82 U/L   BETA-HYDROXYBUTYRATE, PLASMA OR SERUM   Result Value Ref Range    BETA-HYDROXYBUTYRATE 0.12 0.02 - 0.29 mmol/L   POC BLOOD GLUCOSE (RESULTS)   Result Value Ref Range    GLUCOSE, POC 198 (H) 70 - 100 mg/dl   ECG 12 LEAD   Result Value Ref Range    Ventricular rate 90 BPM    Atrial Rate 90 BPM    PR Interval 184 ms    QRS Duration 82 ms    QT Interval 382 ms    QTC Calculation 467 ms    Calculated P Axis 73 degrees    Calculated R Axis 95 degrees    Calculated T Axis 40 degrees   DRUG SCREEN, NO CONFIRMATION, URINE   Result Value Ref Range     AMPHET QL Negative Negative    BARB QL Negative Negative    BENZO QL Negative Negative    BUP QL      CANNAQL Negative Negative    COCQL Negative Negative    FENTANYL, RANDOM URINE Negative Negative    OPIATE Negative Negative    OXYCODONE URINE Negative Negative    PCP QL Negative Negative    METHQL Negative Negative   URINALYSIS, MACROSCOPIC   Result Value Ref Range    COLOR Light Yellow Colorless, Light Yellow, Yellow    APPEARANCE Clear Clear    SPECIFIC GRAVITY 1.005 1.002 - 1.030    PH 6.5 5.0 - 9.0    LEUKOCYTES Negative Negative, 100  WBCs/uL    NITRITE Negative Negative    PROTEIN Negative Negative, 10 , 20  mg/dL    GLUCOSE Negative Negative, 30  mg/dL    KETONES Negative Negative, Trace mg/dL    BILIRUBIN Negative Negative, 0.5 mg/dL    BLOOD Negative Negative, 0.03 mg/dL    UROBILINOGEN 2 (A) Normal mg/dL   URINALYSIS, MICROSCOPIC   Result Value Ref Range    RBCS      WBCS <1 <6 /hpf   VENOUS BLOOD GAS/LACTATE   Result Value Ref Range    PH (VENOUS) 7.44 (H) 7.31 - 7.41    PCO2 (VENOUS) 42  41 - 51 mm/Hg    PO2 (VENOUS) 62 (H) 35 - 50 mm/Hg    BICARBONATE (VENOUS) 27.2 (H) 22.0 - 26.0 mmol/L    BASE EXCESS 4.4 (H) 0.0 - 2.0 mmol/L    LACTATE 5.5 (HH) <=2.0 mmol/L    OXYHEMOGLOBIN 71.6 %    CARBOXYHEMOGLOBIN 7.2 (H) <=1.5 %    MET-HEMOGLOBIN 0.1 <=2.0 %    %FIO2 (VENOUS) 21.0 %    O2CT 16.0 %   TROPONIN IN ONE HOUR   Result Value Ref Range    TROPONIN I 14 <20 ng/L   LACTIC ACID - FIRST REFLEX   Result Value Ref Range    LACTIC ACID 5.5 (HH) 0.5 - 2.2 mmol/L   TROPONIN IN THREE HOURS   Result Value Ref Range    TROPONIN I 11 <20 ng/L   LACTIC ACID - SECOND REFLEX   Result Value Ref Range    LACTIC ACID 3.8 (H) 0.5 - 2.2 mmol/L   BASIC METABOLIC PANEL   Result Value Ref Range    SODIUM 137 136 - 145 mmol/L    POTASSIUM 3.5 3.5 - 5.1 mmol/L    CHLORIDE 99 98 - 107 mmol/L    CO2 TOTAL 27 21 - 31 mmol/L    ANION GAP 11 4 - 13 mmol/L    CALCIUM 8.2 (L) 8.6 - 10.3 mg/dL    GLUCOSE 161 (H) 74 - 109 mg/dL    BUN 2  (L) 7 - 25 mg/dL    CREATININE 0.96 0.45 - 1.30 mg/dL    BUN/CREA RATIO 3 (L) 6 - 22    ESTIMATED GFR 121 >59 mL/min/1.45m^2    OSMOLALITY, CALCULATED 272 270 - 290 mOsm/kg   LACTIC ACID LEVEL W/ REFLEX FOR LEVEL >2.0   Result Value Ref Range    LACTIC ACID 4.0 (H) 0.5 - 2.2 mmol/L   LACTIC ACID - FIRST REFLEX   Result Value Ref Range    LACTIC ACID 3.5 (H) 0.5 - 2.2 mmol/L   MAGNESIUM - ONCE   Result Value Ref Range    MAGNESIUM 1.4 (L) 1.9 - 2.7 mg/dL   LIGHT GREEN TOP TUBE   Result Value Ref Range    RAINBOW/EXTRA TUBE AUTO RESULT Yes    LACTIC ACID - SECOND REFLEX   Result Value Ref Range    LACTIC ACID 3.3 (H) 0.5 - 2.2 mmol/L   MAGNESIUM   Result Value Ref Range    MAGNESIUM 1.4 (L) 1.9 - 2.7 mg/dL   COMPREHENSIVE METABOLIC PANEL, NON-FASTING   Result Value Ref Range    SODIUM 135 (L) 136 - 145 mmol/L    POTASSIUM 3.5 3.5 - 5.1 mmol/L    CHLORIDE 97 (L) 98 - 107 mmol/L    CO2 TOTAL 29 21 - 31 mmol/L    ANION GAP 9 4 - 13 mmol/L    BUN 2 (L) 7 - 25 mg/dL    CREATININE 4.09 8.11 - 1.30 mg/dL    BUN/CREA RATIO 3 (L) 6 - 22    ESTIMATED GFR 120 >59 mL/min/1.28m^2    ALBUMIN 3.4 (L) 3.5 - 5.7 g/dL    CALCIUM 8.2 (L) 8.6 - 10.3 mg/dL    GLUCOSE 914 (H) 74 - 109 mg/dL    ALKALINE PHOSPHATASE 122 (H) 34 - 104 U/L    ALT (SGPT) 54 (H) 7 - 52 U/L    AST (SGOT) 114 (H) 13 - 39 U/L    BILIRUBIN TOTAL 1.3 (H) 0.3 - 1.2  mg/dL    PROTEIN TOTAL 6.3 (L) 6.4 - 8.9 g/dL    ALBUMIN/GLOBULIN RATIO 1.2 0.8 - 1.4    OSMOLALITY, CALCULATED 268 (L) 270 - 290 mOsm/kg    CALCIUM, CORRECTED 8.7 (L) 8.9 - 10.8 mg/dL    GLOBULIN 2.9 2.9 - 5.4   CBC WITH DIFF   Result Value Ref Range    WBC 12.5 (H) 3.6 - 10.2 x10^3/uL    RBC 4.16 4.06 - 5.63 x10^6/uL    HGB 14.6 12.5 - 16.3 g/dL    HCT 16.1 09.6 - 04.5 %    MCV 102.9 (H) 73.0 - 96.2 fL    MCH 35.1 (H) 23.8 - 33.4 pg    MCHC 34.1 32.5 - 36.3 g/dL    RDW 40.9 81.1 - 91.4 %    PLATELETS 319 140 - 440 x10^3/uL    MPV 7.6 7.4 - 11.4 fL   MANUAL DIFFERENTIAL   Result Value Ref Range    WBC 12.5  x10^3/uL    NEUTROPHIL % 70 40 - 76 %    LYMPHOCYTE % 16 (L) 25 - 45 %    MONOCYTE % 10 0 - 12 %    EOSINOPHIL % 3 0 - 7 %    BASOPHIL %      METAMYELOCYTE %      MYELOCYTE % 1 %    PROMYELOCYTE %      BAND %      BLAST %      OTHER %      NEUTROPHIL ABSOLUTE 8.75 (H) 1.80 - 8.40 x10^3/uL    LYMPHOCYTE ABSOLUTE 2.00 1.10 - 5.00 x10^3/uL    MONOCYTE ABSOLUTE 1.25 0.00 - 1.30 x10^3/uL    EOSINOPHIL ABSOLUTE 0.38 0.00 - 0.80 x10^3/uL    BASOPHIL ABSOLUTE      METAMYELOCYTE ABSOLUTE      MYELOCYTE ABSOLUTE 0.13 x10^3/uL    PROMYELOCYTE ABSOLUTE      BLAST ABSOLUTE      OTHER CELL ABSOLUTE      ANISOCYTOSIS 1+ (10-25%)     POLYCHROMASIA      POIKILOCYTOSIS      BASOPHILIC STIPPLING      MICROCYTOSIS      MACROCYTOSIS      ROULEAUX      SCHISTOCYTES      SPHEROCYTES 1+ (10-25%)     TARGET CELLS 1+ (10-25%)     TEARDROP CELLS      OVALOCYTE (ELLIPTOCYTE)      CRENATED RED CELLS      STOMATOCYTES Absent     ACANTHOCYTES (SPUR CELL)      ECHINOCYTE (BURR CELL)      BLISTER CELLS      RBC AGGLUTINATES      HOWELL JOLLY BODIES      ATYPICAL LYMPHOCYTES      TOXIC GRANULATION      DOHLE BODIES      TOXIC VACUOLIZATION      AUER RODS      BASKET CELLS      HYPERSEGMENTATION      LARGE PLATELETS      PLATELET CLUMPS      PLATELET MORPHOLOGY COMMENT Normal     BANDS NEUTROPHILS MANUAL      BAND ABSOLUTE      NEUTROPHILS MANUAL 70     LYMPHOCYTES MANUAL 16     MONOCYTES MANUAL 10     EOSINOPHILS MANUAL 3     BASOPHILS MANUAL  PROMYELOCYTES MANUAL      MYELOCYTES MANUAL 1     METAMYELOCYTES MANUAL      BLASTS MANUAL      TOTAL CELLS COUNTED [#] IN BLOOD 100     OTHER CELLS MANUAL      NUCLEATED RBC MANUAL      PLASMA CELL %      PLASMA CELL ABSOLUE      PLASMA CELLS MANUAL      HYPOCHROMASIA     HGA1C (HEMOGLOBIN A1C WITH EST AVG GLUCOSE)   Result Value Ref Range    HEMOGLOBIN A1C 6.0 4.0 - 6.0 %   LACTIC ACID LEVEL W/ REFLEX FOR LEVEL >2.0   Result Value Ref Range    LACTIC ACID 3.2 (H) 0.5 - 2.2 mmol/L   LACTIC ACID - FIRST  REFLEX   Result Value Ref Range    LACTIC ACID 2.8 (H) 0.5 - 2.2 mmol/L     Micro Results: No results found for any visits on 10/30/22 (from the past 24 hour(s)).  Images:   No results found.   CT ABDOMEN PELVIS W IV CONTRAST   Final Result   Addendum (preliminary) 1 of 1   ADDENDUM:   Additional impression:   There is abnormal mineralization to each superomedial femoral head    suggesting avascular necrosis.            Radiologist location ID: [id number]         Final   1. Mural thickening of the rectosigmoid colon compatible with the history of ulcerative colitis. This is similar to the prior scan.   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         Radiologist location ID: [id number]         XR CHEST AP AND LATERAL   Final Result   NO ACUTE FINDINGS.         Radiologist location ID: [id number]              Problem List:  Active Hospital Problems   (*Primary Problem)    Diagnosis    *Ulcerative colitis (CMS HCC)    Hypomagnesemia    Alcohol abuse    Urinary hesitancy    Lactic acidosis    Hypokalemia       Assessment/ Plan:   Ulcerative colitis flare   -GI following   -Patient noncompliant with treatment  -Continue Levaquin and flagyl  -possible d/c in a day or two with OP colonoscopy     Hypomagnesemia  -replace as needed     Urinary hesitancy   -improved with IV fluids    Hypokalemia  -replace as needed     Lactic acidosis  -continue fluids     Prediabetes  -accu checks and SSI       -further treatment adjustments will be made based off clinical course.  See orders for further details.  -Hospitalist personally evaluated and examined the patient in conjunction with the MLP and agree with the assessments, treatment plan and disposition of the patient as recorded by the [facility].     DVT/PE Prophylaxis: Heparin    Ruben Reason,  PA-C

## 2022-10-31 NOTE — ED Attending Handoff Note (Signed)
[facility]  Emergency Department  Course Note    Patient Name: [name]  Age and Gender: 47 y.o. male  Date of Birth: Jan 25, 1976  Date of Service: 10/30/2022  MRN: [id number]  PCP: Mariah Milling, DO    After a thorough discussion of the patient I have assumed care of [name] from Dr. Joseph Art at 19:38    VS:  Temperature: 36.7 C (98 F)  Heart Rate: 92  Respiratory Rate: 18  BP (Non-Invasive): (!) 140/82  SpO2: 93 %    DIAGNOSTICS  Labs Ordered/Reviewed   COMPREHENSIVE METABOLIC PANEL, NON-FASTING - Abnormal; Notable for the following components:       Result Value    POTASSIUM 2.5 (*)     CHLORIDE 93 (*)     ANION GAP 18 (*)     BUN 3 (*)     CREATININE 0.59 (*)     BUN/CREA RATIO 5 (*)     GLUCOSE 197 (*)     ALKALINE PHOSPHATASE 133 (*)     ALT (SGPT) 57 (*)     AST (SGOT) 111 (*)     BILIRUBIN TOTAL 1.4 (*)     All other components within normal limits    Narrative:     Estimated Glomerular Filtration Rate (eGFR) is calculated using the CKD-EPI (2021) equation, intended for patients 12 years of age and older. If gender is not documented or "unknown", there will be no eGFR calculation.     ETHANOL, SERUM/PLASMA - Abnormal; Notable for the following components:    ETHANOL 196 (*)     All other components within normal limits   CBC WITH DIFF - Abnormal; Notable for the following components:    WBC 15.1 (*)     MCV 104.3 (*)     MCH 35.8 (*)     NEUTROPHIL # 9.60 (*)     LYMPHOCYTE # 3.60 (*)     MONOCYTE # 1.40 (*)     BASOPHIL # 0.20 (*)     All other components within normal limits   URINALYSIS, MACROSCOPIC - Abnormal; Notable for the following components:    UROBILINOGEN 2 (*)     All other components within normal limits   VENOUS BLOOD GAS/LACTATE - Abnormal; Notable for the following components:    PH (VENOUS) 7.44 (*)     PO2 (VENOUS) 62 (*)     BICARBONATE (VENOUS) 27.2 (*)     BASE EXCESS 4.4 (*)     LACTATE 5.5 (*)     CARBOXYHEMOGLOBIN 7.2 (*)     All other  components within normal limits   LACTIC ACID - FIRST REFLEX - Abnormal; Notable for the following components:    LACTIC ACID 5.5 (*)     All other components within normal limits   LACTIC ACID - SECOND REFLEX - Abnormal; Notable for the following components:    LACTIC ACID 3.8 (*)     All other components within normal limits   BASIC METABOLIC PANEL - Abnormal; Notable for the following components:    CALCIUM 8.2 (*)     GLUCOSE 131 (*)     BUN 2 (*)     BUN/CREA RATIO 3 (*)     All other components within normal limits    Narrative:     Estimated Glomerular Filtration Rate (eGFR) is calculated using the CKD-EPI (2021) equation, intended for patients 60 years of age and older. If gender is not documented or "unknown",  there will be no eGFR calculation.     LACTIC ACID LEVEL W/ REFLEX FOR LEVEL >2.0 - Abnormal; Notable for the following components:    LACTIC ACID 4.0 (*)     All other components within normal limits   POC BLOOD GLUCOSE (RESULTS) - Abnormal; Notable for the following components:    GLUCOSE, POC 198 (*)     All other components within normal limits   TROPONIN-I - Normal   TROPONIN-I - Normal   TROPONIN-I - Normal   LIPASE - Normal   BETA-HYDROXYBUTYRATE, PLASMA OR SERUM - Normal   CBC/DIFF    Narrative:     The following orders were created for panel order CBC/DIFF.  Procedure                               Abnormality         Status                     ---------                               -----------         ------                     CBC WITH DIFF[[id number]]                Abnormal            Final result                 Please view results for these tests on the individual orders.   URINALYSIS, MACROSCOPIC AND MICROSCOPIC W/CULTURE REFLEX    Narrative:     The following orders were created for panel order URINALYSIS, MACROSCOPIC AND MICROSCOPIC W/CULTURE REFLEX [PRN ONLY].  Procedure                               Abnormality         Status                     ---------                                -----------         ------                     URINALYSIS, MACROSCOPIC[[id number]]      Abnormal            Final result               URINALYSIS, MICROSCOPIC[[id number]]                          Final result                 Please view results for these tests on the individual orders.   DRUG SCREEN, NO CONFIRMATION, URINE    Narrative:     Any results reported as "positive" on this urine drug screen are unconfirmed screening results and should be used for medical(i.e.,treatment)purposes only. Unconfirmed screening results must not be used for non-medical purposes (e.g. employment or legal testing). Upon request,  all results reported as "positive" can be sent to a reference laboratory for confirmation by GCMS.     Reporting Limits (cut-off concentrations)     Cocaine 300 ng/mL  Opiates 300 ng/mL  THC 50 ng/mL  Amphetamine 1000 ng/mL  Phencyclidine 25 ng/mL  Benzodiazepine 300 ng/mL  Barbiturates 300 ng/mL  Methadone 300 ng/mL  Oxycodone 100 ng/mL  Buprenorphine 5 ng/mL  Fentanyl 5 ng/mL     URINALYSIS, MICROSCOPIC   ARTERIAL BLOOD GAS/LACTATE   LACTIC ACID - FIRST REFLEX   PERFORM POC WHOLE BLOOD GLUCOSE     XR CHEST AP AND LATERAL   Final Result   NO ACUTE FINDINGS.         Radiologist location ID: [id number]             ED COURSE/MEDICAL DECISION MAKING  Medications Administered in the ED   NS bolus infusion 500 mL (0 mL Intravenous Not Given 10/30/22 2000)   NS premix infusion (has no administration in time range)   NS bolus infusion 2,000 mL (0 mL Intravenous Stopped 10/30/22 2109)   potassium bicarbonate-citric acid (EFFER-K) effervescent tablet (50 mEq Oral Given 10/30/22 1959)   potassium chloride 20 mEq in SW 100 mL premix infusion (0 mEq Intravenous Stopped 10/30/22 2109)          Medical Decision Making  This patient's care was assumed from Dr. Joseph Art at shift change.  The patient presented to the ED today after he was instructed to come here by his primary care provider due to low potassium.  The patient  states he has had low potassium in the past and thinks it may be due to Barrett's esophagus and ulcerative colitis.  The patient's potassium was replaced in the ED.  lactate was 5.5 therefore he received multiple fluid boluses as well.  Labs were repeated that revealed the lactate had decreased to 3.8.  Lactate was repeated a 3rd time 2 hours later and had increased to 4.0.  The patient currently complains of no pain and has no significant complaints.  However, the case was discussed with the nocturnist and the patient will be admitted for hydration.  A CT of the abdomen pelvis with IV contrast has been ordered.      CLINICAL IMPRESSION  Clinical Impression   Acute alcohol intoxication (CMS HCC) (Primary)     DISPOSITION  Data Unavailable       DISCHARGE MEDICATIONS  Current Discharge Medication List          Langley Adie D.O.   10/31/2022, 00:28   [facility]  Department of Emergency Medicine  [facility]    This note was partially generated using MModal Fluency Direct system, and there may be some incorrect words, spellings, and punctuation that were not noted in checking the note before saving.

## 2022-10-31 NOTE — Care Plan (Signed)
Patient admitted with abnormal labs from PCP/Ulcerative colitis found on abd CT. Potassium and magnesium replaced. IVF going at 75. GI consult. Patient is A&O*4, on room air, uses CPAP at night. Fall prevention protocol in place- call bell in reach, non-slip sock on patient. Education ongoing. Discharge planning in place.   Problem: Adult Inpatient Plan of Care  Goal: Absence of Hospital-Acquired Illness or Injury  Intervention: Identify and Manage Fall Risk  Recent Flowsheet Documentation  Taken 10/31/2022 0445 by Brock Bad, RN  Safety Promotion/Fall Prevention:   fall prevention program maintained   nonskid shoes/slippers when out of bed   safety round/check completed     Problem: Adult Inpatient Plan of Care  Goal: Absence of Hospital-Acquired Illness or Injury  Intervention: Prevent and Manage VTE (Venous Thromboembolism) Risk  Recent Flowsheet Documentation  Taken 10/31/2022 0445 by Brock Bad, RN  VTE Prevention/Management:   ambulation promoted   compression stockings off   dorsiflexion/plantar flexion performed   foot pump device off   sequential compression devices off     Problem: Diabetes Comorbidity  Goal: Blood Glucose Level Within Targeted Range  Outcome: Ongoing (see interventions/notes)

## 2022-10-31 NOTE — Consults (Signed)
[name]  Gastroenterology/ Hepatology Consult Note    [name], [name], 47 y.o. male  Encounter Start Date:  10/30/2022  Inpatient Admission Date:    Date of Birth:  08/13/1975    Date of Service: 10/31/22     Referring physician:  No ref. provider found    Chief Complaint: diarrhea/ulcerative colitis    [name] is a 47 y.o., White male who we are consulted for diarrhea/UC flare.  He is known to our practice but is non-complaint.  His last appt in office was in February with previous f/u 02/22.  He was no show for f/u last month.  He was prescribed Apriso and cholestyramine but states :"they don't work".  CT abd shows mural thickening of the rectosigmoid colon.  He states he has had intermittent diarrhea at home.  He has been started on Levaquin and Flagyl.            REVIEW OF SYSTEMS:    Review of Systems was completed with pertinent ROS as addressed in HPI.            Medications Prior to Admission       Prescriptions    albuterol sulfate (PROVENTIL OR VENTOLIN OR PROAIR) 90 mcg/actuation Inhalation oral inhaler    Take 1-2 Puffs by inhalation Every 6 hours as needed    BROMPHENIR-DIPHENHYD-PHENYLEPH ORAL    Take by mouth    budesonide-formoteroL (SYMBICORT) 160-4.5 mcg/actuation Inhalation oral inhaler    Take 2 Puffs by inhalation Twice daily    cholestyramine-sucrose (QUESTRAN) 4 gram Oral Powder in Packet    Take 1 Packet by mouth Once a day    famotidine (PEPCID) 40 mg Oral Tablet    Take 1 Tablet (40 mg total) by mouth Twice daily    levoFLOXacin (LEVAQUIN) 750 mg Oral Tablet    Take 1 Tablet (750 mg total) by mouth Once a day    Mesalamine (APRISO) 0.375 gram Oral Capsule, Sust. Release 24 hr    Take 4 Capsules (1.5 g total) by mouth Once a day    omeprazole (PRILOSEC) 40 mg Oral Capsule, Delayed Release(E.C.)    Take 1 Capsule (40 mg total) by mouth Once a day    Pseudoephedrine-Guaifenesin (MUCINEX D) 60-600 mg Oral Tablet  Sustained Release 12 hr    Take 1 Tablet by mouth Every 12 hours    spironolactone (ALDACTONE) 50 mg Oral Tablet    Take 1 Tablet (50 mg total) by mouth Once a day             Allergies:    Allergies   Allergen Reactions    Noctec [Chloral Hydrate] Anaphylaxis    Penicillins Anaphylaxis    Theophylline Shortness of Breath    Flexeril [Cyclobenzaprine] Mental Status Effect     Homicidal ideation    Prednisone Myalgia    Steroids [Steroid] NO Steroids unless approved by Attending Physician         ED medications:   Medications Administered in the ED   NS bolus infusion 2,000 mL (0 mL Intravenous Stopped 10/30/22 2109)   potassium bicarbonate-citric acid (EFFER-K) effervescent tablet (50 mEq Oral Given 10/30/22 1959)   potassium chloride 20 mEq in SW 100 mL premix infusion (0 mEq Intravenous Stopped 10/30/22 2109)         Home Meds:      Prior to Admission medications    Medication Sig Start Date End Date Taking? Authorizing Provider   albuterol sulfate (PROVENTIL OR  VENTOLIN OR PROAIR) 90 mcg/actuation Inhalation oral inhaler Take 1-2 Puffs by inhalation Every 6 hours as needed    Provider, Historical   BROMPHENIR-DIPHENHYD-PHENYLEPH ORAL Take by mouth    Provider, Historical   budesonide-formoteroL (SYMBICORT) 160-4.5 mcg/actuation Inhalation oral inhaler Take 2 Puffs by inhalation Twice daily    Provider, Historical   cholestyramine-sucrose (QUESTRAN) 4 gram Oral Powder in Packet Take 1 Packet by mouth Once a day 11/08/21   Provider, Historical   famotidine (PEPCID) 40 mg Oral Tablet Take 1 Tablet (40 mg total) by mouth Twice daily    Provider, Historical   levoFLOXacin (LEVAQUIN) 750 mg Oral Tablet Take 1 Tablet (750 mg total) by mouth Once a day    Provider, Historical   Mesalamine (APRISO) 0.375 gram Oral Capsule, Sust. Release 24 hr Take 4 Capsules (1.5 g total) by mouth Once a day    Provider, Historical   omeprazole (PRILOSEC) 40 mg Oral Capsule, Delayed Release(E.C.) Take 1 Capsule (40 mg total) by mouth Once a  day    Provider, Historical   Pseudoephedrine-Guaifenesin (MUCINEX D) 60-600 mg Oral Tablet Sustained Release 12 hr Take 1 Tablet by mouth Every 12 hours    Provider, Historical   spironolactone (ALDACTONE) 50 mg Oral Tablet Take 1 Tablet (50 mg total) by mouth Once a day 10/16/21   Provider, Historical          Inpatient Meds:      acetaminophen (TYLENOL) tablet, 650 mg, Oral, Q4H PRN  aluminum-magnesium hydroxide-simethicone (MAG-AL PLUS) 200-200-20 mg per 5 mL oral liquid, 30 mL, Oral, Q4H PRN  budesonide-formoterol (SYMBICORT) 160 mcg-4.5 mcg per inhalation oral inhaler - "Respiratory to administer", 2 Puff, Inhalation, 2x/day  cholestyramine-sucrose (QUESTRAN) 4 gram packet, 1 Packet, Oral, Daily  Correction/SSIP insulin lispro (HumaLOG) 100 units/mL injection, 0-18 Units, Subcutaneous, 4x/day AC  dextrose (GLUTOSE) 40% oral gel, 15 g, Oral, Q15 Min PRN  dextrose 50% (0.5 g/mL) injection - syringe, 12.5 g, Intravenous, Q15 Min PRN  famotidine (PEPCID) tablet, 40 mg, Oral, 2x/day  folic acid (FOLVITE) tablet, 1 mg, Oral, Daily  glucagon (GLUCAGEN DIAGNOSTIC KIT) injection 1 mg, 1 mg, IntraMUSCULAR, Once PRN  heparin 5,000 unit/mL injection, 5,000 Units, Subcutaneous, Q8HRS  levoFLOXacin (LEVAQUIN) 500 mg in D5W 100 mL premix IVPB, 500 mg, Intravenous, Q24H  magnesium oxide (MAG-OX) 400mg  (241.3 mg elemental magnesium) tablet, 400 mg, Oral, 2x/day  magnesium sulfate 1 G in D5W 100 mL premix IVPB, 1 g, Intravenous, Once  magnesium sulfate 1 G in D5W 100 mL premix IVPB, 1 g, Intravenous, Once  Mesalamine Capsule, Sust. Release 24 hr 1.5 g, 1.5 g, Oral, Daily  metroNIDAZOLE (FLAGYL) 500 mg in NS 100 mL premix IVPB, 500 mg, Intravenous, Q12H  nicotine (NICODERM CQ) transdermal patch (mg/24 hr), 21 mg, Transdermal, Now  [START ON 11/01/2022] nicotine (NICODERM CQ) transdermal patch (mg/24 hr), 42 mg, Transdermal, Daily  pantoprazole (PROTONIX) delayed release tablet, 40 mg, Oral, Daily  potassium chloride (K-DUR)  extended release tablet, 40 mEq, Oral, Now  pyridOXINE Vitamin B6 tablet, 100 mg, Oral, Daily  spironolactone (ALDACTONE) tablet, 50 mg, Oral, Daily  tamsulosin (FLOMAX) capsule, 0.4 mg, Oral, Daily after Dinner  thiamine-vitamin B1 tablet, 100 mg, Oral, Daily  traZODone (DESYREL) tablet, 50 mg, Oral, HS PRN - MR x 1             PMHx:    Past Medical History:   Diagnosis Date    Asthma     Bleeding hemorrhoid     Bleeding  ulcer     Cervical herniated disc     COPD (chronic obstructive pulmonary disease) (CMS HCC)     GERD (gastroesophageal reflux disease)     Hx MRSA infection     Hyperlipidemia     Hypertension     Pre-diabetes     Rotator cuff arthropathy, right     Ulcerative colitis (CMS HCC)         PSHx:   Past Surgical History:   Procedure Laterality Date    HX CYST REMOVAL      lower back    HX HERNIA REPAIR      HX TONSILLECTOMY            Family History  Family Medical History:       Problem Relation (Age of Onset)    Anesthesia Complications Father    Arthritis-osteo Mother, Father    Asthma Mother, Father, Brother, Paternal Grandmother, Paternal Grandfather    Blood Clots Father, Paternal Grandmother, Paternal Grandfather    Cancer Mother, Father    Congestive Heart Failure Father, Paternal Grandmother, Paternal Grandfather    Coronary Artery Disease Father, Paternal Grandmother, Paternal Grandfather    Diabetes Mother, Father    Heart Attack Father, Paternal Grandmother, Paternal Grandfather    High Cholesterol Mother, Father    Hypertension (High Blood Pressure) Mother, Father    Stroke Father, Maternal Grandmother, Maternal Grandfather, Paternal Grandmother, Paternal Grandfather           Social History  Social History     Tobacco Use    Smoking status: Every Day     Current packs/day: 1.00     Average packs/day: 1 pack/day for 30.0 years (30.0 ttl pk-yrs)     Types: Cigarettes    Smokeless tobacco: Never   Vaping Use    Vaping status: Never Used   Substance Use Topics    Alcohol use: Yes      Alcohol/week: 8.0 standard drinks of alcohol     Types: 8 Cans of beer per week     Comment: occasional    Drug use: Never             Previous GI Procedures:  * No surgery found *      Vital Signs:  Temp (24hrs) Max:36.9 C (98.5 F)      Systolic (24hrs), Avg:147 , Min:140 , Max:154     Diastolic (24hrs), Avg:89, Min:82, Max:93    Temp  Avg: 36.8 C (98.2 F)  Min: 36.7 C (98 F)  Max: 36.9 C (98.5 F)  Pulse  Avg: 98.5  Min: 88  Max: 119  Resp  Avg: 18.9  Min: 12  Max: 27  SpO2  Avg: 92.9 %  Min: 89 %  Max: 98 %           PHYSICAL EXAM:   Temperature: 36.7 C (98 F)  Heart Rate: 92  BP (Non-Invasive): (!) 147/93  Respiratory Rate: 18  SpO2: 90 %  Vital signs reviewed  General  No acute distress  Skin color normal  HEENT  Normal, no icterus, neck supple  Lungs clear bilaterally  Card RRR,   Abdomen  BS active, no tenderness, no guarding, no distension,   Ext  no edema       RELEVANT LABS:  Labs:    Results for orders placed or performed during the [name] encounter of 10/30/22 (from the past 24 hour(s))   COMPREHENSIVE METABOLIC PANEL, NON-FASTING   Result Value Ref Range  SODIUM 136 136 - 145 mmol/L    POTASSIUM 2.5 (LL) 3.5 - 5.1 mmol/L    CHLORIDE 93 (L) 98 - 107 mmol/L    CO2 TOTAL 25 21 - 31 mmol/L    ANION GAP 18 (H) 4 - 13 mmol/L    BUN 3 (L) 7 - 25 mg/dL    CREATININE 6.04 (L) 0.60 - 1.30 mg/dL    BUN/CREA RATIO 5 (L) 6 - 22    ESTIMATED GFR 123 >59 mL/min/1.10m^2    ALBUMIN 3.7 3.5 - 5.7 g/dL    CALCIUM 9.1 8.6 - 54.0 mg/dL    GLUCOSE 981 (H) 74 - 109 mg/dL    ALKALINE PHOSPHATASE 133 (H) 34 - 104 U/L    ALT (SGPT) 57 (H) 7 - 52 U/L    AST (SGOT) 111 (H) 13 - 39 U/L    BILIRUBIN TOTAL 1.4 (H) 0.3 - 1.2 mg/dL    PROTEIN TOTAL 6.9 6.4 - 8.9 g/dL    ALBUMIN/GLOBULIN RATIO 1.2 0.8 - 1.4    OSMOLALITY, CALCULATED 274 270 - 290 mOsm/kg    CALCIUM, CORRECTED 9.3 8.9 - 10.8 mg/dL    GLOBULIN 3.2 2.9 - 5.4   ETHANOL, SERUM/PLASMA   Result Value Ref Range    ETHANOL 196 (H) 0 mg/dL   CBC WITH DIFF   Result  Value Ref Range    WBC 15.1 (H) 3.6 - 10.2 x10^3/uL    RBC 4.44 4.06 - 5.63 x10^6/uL    HGB 15.9 12.5 - 16.3 g/dL    HCT 19.1 47.8 - 29.5 %    MCV 104.3 (H) 73.0 - 96.2 fL    MCH 35.8 (H) 23.8 - 33.4 pg    MCHC 34.3 32.5 - 36.3 g/dL    RDW 62.1 30.8 - 65.7 %    PLATELETS 338 140 - 440 x10^3/uL    MPV 7.8 7.4 - 11.4 fL    NEUTROPHIL % 64 43 - 77 %    LYMPHOCYTE % 24 16 - 44 %    MONOCYTE % 9 5 - 13 %    EOSINOPHIL % 2 %    BASOPHIL % 1 0 - 1 %    NEUTROPHIL # 9.60 (H) 1.85 - 7.80 x10^3/uL    LYMPHOCYTE # 3.60 (H) 1.00 - 3.00 x10^3/uL    MONOCYTE # 1.40 (H) 0.30 - 1.00 x10^3/uL    EOSINOPHIL # 0.30 0.00 - 0.50 x10^3/uL    BASOPHIL # 0.20 (H) 0.00 - 0.10 x10^3/uL   TROPONIN NOW   Result Value Ref Range    TROPONIN I 11 <20 ng/L   LIPASE   Result Value Ref Range    LIPASE 18 11 - 82 U/L   BETA-HYDROXYBUTYRATE, PLASMA OR SERUM   Result Value Ref Range    BETA-HYDROXYBUTYRATE 0.12 0.02 - 0.29 mmol/L   POC BLOOD GLUCOSE (RESULTS)   Result Value Ref Range    GLUCOSE, POC 198 (H) 70 - 100 mg/dl   ECG 12 LEAD   Result Value Ref Range    Ventricular rate 90 BPM    Atrial Rate 90 BPM    PR Interval 184 ms    QRS Duration 82 ms    QT Interval 382 ms    QTC Calculation 467 ms    Calculated P Axis 73 degrees    Calculated R Axis 95 degrees    Calculated T Axis 40 degrees   DRUG SCREEN, NO CONFIRMATION, URINE   Result Value Ref Range  AMPHET QL Negative Negative    BARB QL Negative Negative    BENZO QL Negative Negative    BUP QL      CANNAQL Negative Negative    COCQL Negative Negative    FENTANYL, RANDOM URINE Negative Negative    OPIATE Negative Negative    OXYCODONE URINE Negative Negative    PCP QL Negative Negative    METHQL Negative Negative   URINALYSIS, MACROSCOPIC   Result Value Ref Range    COLOR Light Yellow Colorless, Light Yellow, Yellow    APPEARANCE Clear Clear    SPECIFIC GRAVITY 1.005 1.002 - 1.030    PH 6.5 5.0 - 9.0    LEUKOCYTES Negative Negative, 100  WBCs/uL    NITRITE Negative Negative    PROTEIN Negative  Negative, 10 , 20  mg/dL    GLUCOSE Negative Negative, 30  mg/dL    KETONES Negative Negative, Trace mg/dL    BILIRUBIN Negative Negative, 0.5 mg/dL    BLOOD Negative Negative, 0.03 mg/dL    UROBILINOGEN 2 (A) Normal mg/dL   URINALYSIS, MICROSCOPIC   Result Value Ref Range    RBCS      WBCS <1 <6 /hpf   VENOUS BLOOD GAS/LACTATE   Result Value Ref Range    PH (VENOUS) 7.44 (H) 7.31 - 7.41    PCO2 (VENOUS) 42 41 - 51 mm/Hg    PO2 (VENOUS) 62 (H) 35 - 50 mm/Hg    BICARBONATE (VENOUS) 27.2 (H) 22.0 - 26.0 mmol/L    BASE EXCESS 4.4 (H) 0.0 - 2.0 mmol/L    LACTATE 5.5 (HH) <=2.0 mmol/L    OXYHEMOGLOBIN 71.6 %    CARBOXYHEMOGLOBIN 7.2 (H) <=1.5 %    MET-HEMOGLOBIN 0.1 <=2.0 %    %FIO2 (VENOUS) 21.0 %    O2CT 16.0 %   TROPONIN IN ONE HOUR   Result Value Ref Range    TROPONIN I 14 <20 ng/L   LACTIC ACID - FIRST REFLEX   Result Value Ref Range    LACTIC ACID 5.5 (HH) 0.5 - 2.2 mmol/L   TROPONIN IN THREE HOURS   Result Value Ref Range    TROPONIN I 11 <20 ng/L   LACTIC ACID - SECOND REFLEX   Result Value Ref Range    LACTIC ACID 3.8 (H) 0.5 - 2.2 mmol/L   BASIC METABOLIC PANEL   Result Value Ref Range    SODIUM 137 136 - 145 mmol/L    POTASSIUM 3.5 3.5 - 5.1 mmol/L    CHLORIDE 99 98 - 107 mmol/L    CO2 TOTAL 27 21 - 31 mmol/L    ANION GAP 11 4 - 13 mmol/L    CALCIUM 8.2 (L) 8.6 - 10.3 mg/dL    GLUCOSE 469 (H) 74 - 109 mg/dL    BUN 2 (L) 7 - 25 mg/dL    CREATININE 6.29 5.28 - 1.30 mg/dL    BUN/CREA RATIO 3 (L) 6 - 22    ESTIMATED GFR 121 >59 mL/min/1.69m^2    OSMOLALITY, CALCULATED 272 270 - 290 mOsm/kg   LACTIC ACID LEVEL W/ REFLEX FOR LEVEL >2.0   Result Value Ref Range    LACTIC ACID 4.0 (H) 0.5 - 2.2 mmol/L   LACTIC ACID - FIRST REFLEX   Result Value Ref Range    LACTIC ACID 3.5 (H) 0.5 - 2.2 mmol/L   MAGNESIUM - ONCE   Result Value Ref Range    MAGNESIUM 1.4 (L) 1.9 - 2.7 mg/dL   LIGHT GREEN  TOP TUBE   Result Value Ref Range    RAINBOW/EXTRA TUBE AUTO RESULT Yes    LACTIC ACID - SECOND REFLEX   Result Value Ref Range     LACTIC ACID 3.3 (H) 0.5 - 2.2 mmol/L   MAGNESIUM   Result Value Ref Range    MAGNESIUM 1.4 (L) 1.9 - 2.7 mg/dL   COMPREHENSIVE METABOLIC PANEL, NON-FASTING   Result Value Ref Range    SODIUM 135 (L) 136 - 145 mmol/L    POTASSIUM 3.5 3.5 - 5.1 mmol/L    CHLORIDE 97 (L) 98 - 107 mmol/L    CO2 TOTAL 29 21 - 31 mmol/L    ANION GAP 9 4 - 13 mmol/L    BUN 2 (L) 7 - 25 mg/dL    CREATININE 1.61 0.96 - 1.30 mg/dL    BUN/CREA RATIO 3 (L) 6 - 22    ESTIMATED GFR 120 >59 mL/min/1.70m^2    ALBUMIN 3.4 (L) 3.5 - 5.7 g/dL    CALCIUM 8.2 (L) 8.6 - 10.3 mg/dL    GLUCOSE 045 (H) 74 - 109 mg/dL    ALKALINE PHOSPHATASE 122 (H) 34 - 104 U/L    ALT (SGPT) 54 (H) 7 - 52 U/L    AST (SGOT) 114 (H) 13 - 39 U/L    BILIRUBIN TOTAL 1.3 (H) 0.3 - 1.2 mg/dL    PROTEIN TOTAL 6.3 (L) 6.4 - 8.9 g/dL    ALBUMIN/GLOBULIN RATIO 1.2 0.8 - 1.4    OSMOLALITY, CALCULATED 268 (L) 270 - 290 mOsm/kg    CALCIUM, CORRECTED 8.7 (L) 8.9 - 10.8 mg/dL    GLOBULIN 2.9 2.9 - 5.4   CBC WITH DIFF   Result Value Ref Range    WBC 12.5 (H) 3.6 - 10.2 x10^3/uL    RBC 4.16 4.06 - 5.63 x10^6/uL    HGB 14.6 12.5 - 16.3 g/dL    HCT 40.9 81.1 - 91.4 %    MCV 102.9 (H) 73.0 - 96.2 fL    MCH 35.1 (H) 23.8 - 33.4 pg    MCHC 34.1 32.5 - 36.3 g/dL    RDW 78.2 95.6 - 21.3 %    PLATELETS 319 140 - 440 x10^3/uL    MPV 7.6 7.4 - 11.4 fL   MANUAL DIFFERENTIAL   Result Value Ref Range    WBC 12.5 x10^3/uL    NEUTROPHIL % 70 40 - 76 %    LYMPHOCYTE % 16 (L) 25 - 45 %    MONOCYTE % 10 0 - 12 %    EOSINOPHIL % 3 0 - 7 %    BASOPHIL %      METAMYELOCYTE %      MYELOCYTE % 1 %    PROMYELOCYTE %      BAND %      BLAST %      OTHER %      NEUTROPHIL ABSOLUTE 8.75 (H) 1.80 - 8.40 x10^3/uL    LYMPHOCYTE ABSOLUTE 2.00 1.10 - 5.00 x10^3/uL    MONOCYTE ABSOLUTE 1.25 0.00 - 1.30 x10^3/uL    EOSINOPHIL ABSOLUTE 0.38 0.00 - 0.80 x10^3/uL    BASOPHIL ABSOLUTE      METAMYELOCYTE ABSOLUTE      MYELOCYTE ABSOLUTE 0.13 x10^3/uL    PROMYELOCYTE ABSOLUTE      BLAST ABSOLUTE      OTHER CELL ABSOLUTE       ANISOCYTOSIS 1+ (10-25%)     POLYCHROMASIA      POIKILOCYTOSIS  BASOPHILIC STIPPLING      MICROCYTOSIS      MACROCYTOSIS      ROULEAUX      SCHISTOCYTES      SPHEROCYTES 1+ (10-25%)     TARGET CELLS 1+ (10-25%)     TEARDROP CELLS      OVALOCYTE (ELLIPTOCYTE)      CRENATED RED CELLS      STOMATOCYTES Absent     ACANTHOCYTES (SPUR CELL)      ECHINOCYTE (BURR CELL)      BLISTER CELLS      RBC AGGLUTINATES      HOWELL JOLLY BODIES      ATYPICAL LYMPHOCYTES      TOXIC GRANULATION      DOHLE BODIES      TOXIC VACUOLIZATION      AUER RODS      BASKET CELLS      HYPERSEGMENTATION      LARGE PLATELETS      PLATELET CLUMPS      PLATELET MORPHOLOGY COMMENT Normal     BANDS NEUTROPHILS MANUAL      BAND ABSOLUTE      NEUTROPHILS MANUAL 70     LYMPHOCYTES MANUAL 16     MONOCYTES MANUAL 10     EOSINOPHILS MANUAL 3     BASOPHILS MANUAL      PROMYELOCYTES MANUAL      MYELOCYTES MANUAL 1     METAMYELOCYTES MANUAL      BLASTS MANUAL      TOTAL CELLS COUNTED [#] IN BLOOD 100     OTHER CELLS MANUAL      NUCLEATED RBC MANUAL      PLASMA CELL %      PLASMA CELL ABSOLUE      PLASMA CELLS MANUAL      HYPOCHROMASIA     HGA1C (HEMOGLOBIN A1C WITH EST AVG GLUCOSE)   Result Value Ref Range    HEMOGLOBIN A1C 6.0 4.0 - 6.0 %   LACTIC ACID LEVEL W/ REFLEX FOR LEVEL >2.0   Result Value Ref Range    LACTIC ACID 3.2 (H) 0.5 - 2.2 mmol/L   LACTIC ACID - FIRST REFLEX   Result Value Ref Range    LACTIC ACID 2.8 (H) 0.5 - 2.2 mmol/L          No results found for: "CARCIEMBAG", "MQAD24", "CAAGGICA199", "CA199"   CBC  Diff   Lab Results   Component Value Date/Time    WBC 12.5 (H) 10/31/2022 02:15 AM    WBC 12.5 10/31/2022 02:15 AM    HGB 14.6 10/31/2022 02:15 AM    HCT 42.8 10/31/2022 02:15 AM    PLTCNT 319 10/31/2022 02:15 AM    RBC 4.16 10/31/2022 02:15 AM    MCV 102.9 (H) 10/31/2022 02:15 AM    MCHC 34.1 10/31/2022 02:15 AM    MCH 35.1 (H) 10/31/2022 02:15 AM    RDW 14.8 10/31/2022 02:15 AM    MPV 7.6 10/31/2022 02:15 AM    Lab Results   Component  Value Date/Time    PMNS 70 10/31/2022 02:15 AM    LYMPHOCYTES 16 (L) 10/31/2022 02:15 AM    EOSINOPHIL 3 10/31/2022 02:15 AM    MONOCYTES 10 10/31/2022 02:15 AM    BASOPHILS 1 10/30/2022 04:40 PM    BASOPHILS 0.20 (H) 10/30/2022 04:40 PM    PMNABS 9.60 (H) 10/30/2022 04:40 PM    LYMPHSABS 3.60 (H) 10/30/2022 04:40 PM    EOSABS 0.30 10/30/2022 04:40 PM  MONOSABS 1.40 (H) 10/30/2022 04:40 PM           No results found for: "CARCIEMBAG" @LASTIFOB @    Imaging Studies:  No results found for this or any previous visit (from the past 161096045 hour(s)).  No results found for this or any previous visit (from the past 409811914 hour(s)).  Recent Results (from the past 782956213 hour(s))   CT ABDOMEN PELVIS W IV CONTRAST    Collection Time: 10/31/22  1:13 AM    Addendum: 10/31/2022    ADDENDUM:  Additional impression:  There is abnormal mineralization to each superomedial femoral head suggesting avascular necrosis.        Radiologist location ID: [id number]        Narrative    SR. Joanna Puff Hitson SR.    RADIOLOGIST: Mickey Farber, MD    CT ABDOMEN PELVIS W IV CONTRAST performed on 10/31/2022 1:13 AM    CLINICAL HISTORY: elevated lactic acid, Hx ulcerative colitis.  elevated lactic acid, Hx ulcerative colitis, hx. of umbilical hernia repair    TECHNIQUE:  Abdomen and pelvis CT with intravenous contrast.  IV CONTRAST: 85 ml's of Omnipaque 350    COMPARISON:  08/22/2022    FINDINGS:  Lung bases: Clear    Liver:   Diffuse fatty infiltration.    Gallbladder:   Unremarkable.    Spleen:   Unremarkable.    Pancreas:   Unremarkable.    Adrenals:   Unremarkable.    Kidneys:   There is a 2 cm right cyst    Bladder:  Partially decompressed with mural thickening  Prostate:  Unremarkable.    Bowel:   There is mural thickening of the rectosigmoid colon similar to the prior scan. Retained stool scattered throughout the remainder the colon.    Appendix:  Normal.    Lymph nodes:  No suspicious lymph node enlargement.    Vasculature:   Mild  diffuse atherosclerotic calcifications are noted.     Peritoneum / Retroperitoneum: No ascites.  No free air.    Bones:   Degenerative changes of the spine.  There is broad-based central disc herniation at L4-5 resulting in bilateral recess stenosis        Impression    1. Mural thickening of the rectosigmoid colon compatible with the history of ulcerative colitis. This is similar to the prior scan.  2. 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      Radiologist location ID: [id number]     CT ABDOMEN PELVIS WO IV CONTRAST    Collection Time: 08/22/22  5:44 AM    Narrative    SR. Ethon ANDREW Kneisley SR.    RADIOLOGIST: Mallie Mussel    CT ABDOMEN PELVIS WO IV CONTRAST performed on 08/22/2022 5:44 AM    CLINICAL HISTORY: Elevated liver enzymes..  Elevated liver enzymes., sx hx of umbilical hernia repair    TECHNIQUE:  Abdomen and pelvis CT without intravenous contrast.    COMPARISON:  05/31/2022  # of known CTs in the past 12 months:  2   # of known Cardiac Nuclear Medicine Studies in the past 12 months:  0    FINDINGS:  Noncontrast technique limits evaluation of the abdominal and pelvic viscera.    Lung bases: Mild dependent atelectasis    Liver:   Diffuse fatty infiltration.  Gallbladder:   Unremarkable.    Spleen:   Unremarkable.    Pancreas:   Unremarkable.    Adrenals:   Unremarkable.    Kidneys:   Unremarkable.    Bladder:  Mild circumferential wall thickening    Prostate:  Unremarkable.    Bowel:   Colonic diverticulosis without diverticulitis.    Appendix:  Normal.    Lymph nodes:  No suspicious lymph node enlargement.    Vasculature:   Mild diffuse atherosclerotic calcifications are noted.     Peritoneum / Retroperitoneum: No ascites.  No free air.    Bones:   Degenerative changes of the spine. 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  Small bilateral fat-containing inguinal hernias.      Impression    NO ACUTE FINDINGS AT THE ABDOMEN OR PELVIS ON NONCONTRAST CT.    HEPATIC STEATOSIS.       One or more dose reduction techniques were used (e.g., Automated exposure control, adjustment of the mA and/or kV according to patient size, use of iterative reconstruction technique).      Radiologist location ID: [id number]       No results found for this or any previous visit (from the past [id number] hour(s)).  No results found for this or any previous visit (from the past 956213086 hour(s)).  No results found for this or any previous visit (from the past [id number] hour(s)).    Assessment/Plan and Recommendations:  Ulcerative colitis - agree with current management.  Will plan for outpatient colonoscopy to evaluate ulcerative colitis.  WBC's trending down with antibiotics.  Further recommendations dependent on [name] course.  Patient discussed in detail with Dr Concha Se and treatment plan decided by him.          Cooper Render, NP-C

## 2022-11-01 ENCOUNTER — Other Ambulatory Visit: Payer: Self-pay

## 2022-11-01 DIAGNOSIS — E876 Hypokalemia: Secondary | ICD-10-CM

## 2022-11-01 DIAGNOSIS — K529 Noninfective gastroenteritis and colitis, unspecified: Secondary | ICD-10-CM

## 2022-11-01 LAB — COMPREHENSIVE METABOLIC PANEL, NON-FASTING
ALBUMIN/GLOBULIN RATIO: 1.2 (ref 0.8–1.4)
ALBUMIN: 3.2 g/dL — ABNORMAL LOW (ref 3.5–5.7)
ALKALINE PHOSPHATASE: 108 U/L — ABNORMAL HIGH (ref 34–104)
ALT (SGPT): 43 U/L (ref 7–52)
ANION GAP: 9 mmol/L (ref 4–13)
AST (SGOT): 73 U/L — ABNORMAL HIGH (ref 13–39)
BILIRUBIN TOTAL: 1.3 mg/dL — ABNORMAL HIGH (ref 0.3–1.2)
BUN/CREA RATIO: 4 — ABNORMAL LOW (ref 6–22)
BUN: 2 mg/dL — ABNORMAL LOW (ref 7–25)
CALCIUM, CORRECTED: 8.5 mg/dL — ABNORMAL LOW (ref 8.9–10.8)
CALCIUM: 7.9 mg/dL — ABNORMAL LOW (ref 8.6–10.3)
CHLORIDE: 101 mmol/L (ref 98–107)
CO2 TOTAL: 25 mmol/L (ref 21–31)
CREATININE: 0.55 mg/dL — ABNORMAL LOW (ref 0.60–1.30)
ESTIMATED GFR: 125 mL/min/{1.73_m2} (ref >59)
GLOBULIN: 2.7 — ABNORMAL LOW (ref 2.9–5.4)
GLUCOSE: 129 mg/dL — ABNORMAL HIGH (ref 74–109)
OSMOLALITY, CALCULATED: 268 mOsm/kg — ABNORMAL LOW (ref 270–290)
POTASSIUM: 3.3 mmol/L — ABNORMAL LOW (ref 3.5–5.1)
PROTEIN TOTAL: 5.9 g/dL — ABNORMAL LOW (ref 6.4–8.9)
SODIUM: 135 mmol/L — ABNORMAL LOW (ref 136–145)

## 2022-11-01 LAB — CBC
HCT: 40.3 % (ref 36.7–47.1)
HGB: 13.9 g/dL (ref 12.5–16.3)
MCH: 35.9 pg — ABNORMAL HIGH (ref 23.8–33.4)
MCHC: 34.6 g/dL (ref 32.5–36.3)
MCV: 103.7 fL — ABNORMAL HIGH (ref 73.0–96.2)
MPV: 7.8 fL (ref 7.4–11.4)
PLATELETS: 312 10*3/uL (ref 140–440)
RBC: 3.89 10*6/uL — ABNORMAL LOW (ref 4.06–5.63)
RDW: 14.6 % (ref 12.1–16.2)
WBC: 10.5 10*3/uL — ABNORMAL HIGH (ref 3.6–10.2)

## 2022-11-01 LAB — POC BLOOD GLUCOSE (RESULTS)
GLUCOSE, POC: 164 mg/dl — ABNORMAL HIGH (ref 70–100)
GLUCOSE, POC: 173 mg/dl — ABNORMAL HIGH (ref 70–100)

## 2022-11-01 LAB — MAGNESIUM: MAGNESIUM: 2.1 mg/dL (ref 1.9–2.7)

## 2022-11-01 MED ORDER — POTASSIUM CHLORIDE ER 20 MEQ TABLET,EXTENDED RELEASE(PART/CRYST)
40.0000 meq | ORAL_TABLET | Freq: Two times a day (BID) | ORAL | Status: DC
Start: 2022-11-01 — End: 2022-11-01
  Administered 2022-11-01: 40 meq via ORAL
  Filled 2022-11-01: qty 2

## 2022-11-01 MED ORDER — METRONIDAZOLE 500 MG TABLET
500.0000 mg | ORAL_TABLET | Freq: Three times a day (TID) | ORAL | 0 refills | Status: AC
Start: 2022-11-01 — End: 2022-11-07

## 2022-11-01 MED ORDER — LEVOFLOXACIN 500 MG TABLET
500.0000 mg | ORAL_TABLET | ORAL | 0 refills | Status: AC
Start: 2022-11-01 — End: 2022-11-07

## 2022-11-01 MED ORDER — TAMSULOSIN 0.4 MG CAPSULE
0.4000 mg | ORAL_CAPSULE | Freq: Every evening | ORAL | 0 refills | Status: DC
Start: 2022-11-01 — End: 2023-04-01

## 2022-11-01 NOTE — Discharge Summary (Signed)
[place] MEDICINE [facility]     DISCHARGE SUMMARY      PATIENT NAME:  [name]   MRN:  [id number]  DOB:  12/13/1975    INPATIENT ADMISSION DATE: 10/30/2022   DATE OF DISCHARGE:  11/01/22     ATTENDING PHYSICIAN: Delight Ovens, MD     PRIMARY CARE PHYSICIAN: Mariah Milling, DO     HOSPITAL PRESENTATION:    Please see full admission H&P for details.      As per HPI:  This 47 year old white male presents hospital service to the ED with increased diarrhea over last 3 days.  He has known history of ulcerative colitis, states that he does not think that has been occasions working in a longer.  He was supposed to see Dr. Concha Se in the office over the next week or 2 for follow-up.  Patient reports no fever, chills, sweats, but has had some abdominal distention and increased diarrhea without blood noted.  He was evaluated by his PCP, and found to have elevated white blood count as well as a low potassium.  He was advised to come to the ED for evaluation 2 days ago, however did not come until today.  He was found [place] [address], his potassium was 2.5, was corrected in the ED, his lactate was noted to be elevated at 5.5, after fluid bolus, did come down to 3.8, then back up to 4.0.  Patient's magnesium was also noted to be 1.4.  Patient did have CT scan of the abdomen pelvis performed that did show mural thickening of the rectosigmoid compatible with ulcerative colitis.  The patient also has numerous other complaints including weak urination, prediabetes, and states he was told he to come here by his PCP to test his testosterone level.  I do feel this is more of a PCP issue.  We will start patient on Flomax while here, and we will admit him for GI evaluation, antibiotics, fluids, and further evaluation and treatment.  Have asked if patient would take a course of steroids and at this time he is refusing, stating that he has arthralgias with them every time.  The patient also reports that  he has been having some swelling of lower extremities, we will back off on his IV fluids for now to 75 cc an hour, but will go backup if lactate does not continue to improve.                       FURTHER HOSPITAL COURSE WITH DISCHARGE DIAGNOSES:  Patient was admitted and GI consulted who states that patient is not complaint with medications and appointments. His diarrhea is slowing down and his electrolytes have been replaced. He can be discharged home today to f/u with GI outpatient for colonoscopy and medication adjustments.  Patient LFTs were also elevated but were trending down. Patient was instructed to stop drinking. The Hospitalist personally evaluated and examined the patient in conjunction with the MLP and agree with the assessments, treatment plan and disposition of the patient as recorded by the [facility].          Problem List:  Active Hospital Problems   (*Primary Problem)    Diagnosis    *Ulcerative colitis (CMS HCC)    Hypomagnesemia    Alcohol abuse    Urinary hesitancy    Lactic acidosis    Hypokalemia  PHYSICAL EXAM   DAY OF DISCHARGE:    BP (!) 143/97   Pulse 82   Temp 36.6 C (97.9 F)   Resp 16   Ht 1.676 m (5\' 6" )   Wt 81.3 kg (179 lb 3 oz)   SpO2 92%   BMI 28.92 kg/m          General:  Patient is resting in bed, no acute distress, alert and oriented x3   Eyes:  PERRL, no scleral icterus   HENT:  Normocephalic, atraumatic, oral mucosa is moist and pink, no nasal discharge   Heart:  RRR, S1 and S2 auscultated, no murmurs appreciated   Lungs:  Unlabored respirations.  Lungs are clear to auscultation bilaterally, no wheezes, no rales  Abdomen:  Soft, active bowel sounds, non-tender to palpation, non-distended  Extremities:    No edema in lower extremities bilaterally   Skin:  Warm and dry.  Not diaphoretic  Neuro:  A&O x 3.  No focal deficits.  Speech intact.  Not tremulous  Psych:  Cooperative, not agitated    LABS:  CBC with Diff (Last 48 Hours):    Recent Results in last 48  hours     10/30/22  1640 10/31/22  0215 11/01/22  0404   WBC 15.1* 12.5*  12.5 10.5*   HGB 15.9 14.6 13.9   HCT 46.3 42.8 40.3   MCV 104.3* 102.9* 103.7*   PLTCNT 338 319 312   PMNS 64 70  --    LYMPHOCYTES 24 16*  --    MONOCYTES 9 10  --    EOSINOPHIL 2 3  --    BASOPHILS 1  0.20*  --   --           Last BMP  (Last result in the past 48 hours)        Na   K   Cl   CO2   BUN   Cr   Calcium   Glucose   Glucose-Fasting        11/01/22 0404 135   3.3   101   25   2   0.55   7.9   129               Last Hepatic Panel  (Last result in the past 48 hours)        Albumin   Total PTN   Total Bili   Direct Bili   Ast/SGOT   Alt/SGPT   Alk Phos        11/01/22 0404 3.2   5.9   1.3     73   43   108                BMP (Last 48 Hours):    Recent Results in last 48 hours     10/30/22  1640 10/30/22  2228 10/31/22  0215 11/01/22  0404   SODIUM 136 137 135* 135*   POTASSIUM 2.5* 3.5 3.5 3.3*   CHLORIDE 93* 99 97* 101   CO2 25 27 29 25    BUN 3* 2* 2* 2*   CREATININE 0.59* 0.61 0.63 0.55*   CALCIUM 9.1 8.2* 8.2* 7.9*   GLUCOSENF 197* 131* 129* 129*          DISCHARGE MEDICATIONS:     Current Discharge Medication List        START taking these medications.        Details   metroNIDAZOLE 500 mg Tablet  Commonly known as: FLAGYL   500 mg, Oral, 3 TIMES DAILY  Qty: 18 Tablet  Refills: 0     tamsulosin 0.4 mg Capsule  Commonly known as: FLOMAX   0.4 mg, Oral, EVERY EVENING AFTER DINNER  Qty: 30 Capsule  Refills: 0            CONTINUE these medications which have CHANGED during your visit.        Details   levoFLOXacin 500 mg Tablet  Commonly known as: LEVAQUIN  What changed:   medication strength  how much to take  when to take this   500 mg, Oral, EVERY 24 HOURS  Qty: 6 Tablet  Refills: 0            CONTINUE these medications - NO CHANGES were made during your visit.        Details   albuterol sulfate 90 mcg/actuation oral inhaler  Commonly known as: PROVENTIL or VENTOLIN or PROAIR   1-2 Puffs, Inhalation, EVERY 6 HOURS PRN  Refills:  0     Apriso 0.375 gram Capsule, Sust. Release 24 hr  Generic drug: Mesalamine   1,500 mg, Oral, DAILY  Refills: 0     BROMPHENIR-DIPHENHYD-PHENYLEPH ORAL   Oral  Refills: 0     budesonide-formoteroL 160-4.5 mcg/actuation oral inhaler  Commonly known as: SYMBICORT   2 Puffs, Inhalation, 2 TIMES DAILY  Refills: 0     cholestyramine-sucrose 4 gram Powder in Packet  Commonly known as: QUESTRAN   1 Packet, Oral, DAILY  Refills: 0     famotidine 40 mg Tablet  Commonly known as: PEPCID   40 mg, Oral, 2 TIMES DAILY  Refills: 0     omeprazole 40 mg Capsule, Delayed Release(E.C.)  Commonly known as: PRILOSEC   40 mg, Oral, DAILY  Refills: 0     Pseudoephedrine-Guaifenesin 60-600 mg Tablet Sustained Release 12 hr  Commonly known as: MUCINEX D   1 Tablet, Oral, EVERY 12 HOURS  Refills: 0     spironolactone 50 mg Tablet  Commonly known as: ALDACTONE   1 Tablet, Oral, DAILY  Refills: 0                 DISCHARGE DISPOSITION:  home     DISCHARGE INSTRUCTIONS:    Follow-up Information       Mariah Milling, DO Follow up in 1 week(s).    Specialty: FAMILY MEDICINE  Contact information:  [address]  [place] [address]  [phone]                           No discharge procedures on file.   Follow-up Information       Mariah Milling, DO Follow up in 1 week(s).    Specialty: FAMILY MEDICINE  Contact information:  [address]  [place] [address]  [phone]                                    Copies sent to Care Team         Relationship Specialty Notifications Start End    Mariah Milling, DO PCP - General FAMILY PRACTICE  09/16/20     Phone: [phone] Fax: [phone]         [address] [place] [address]            >30 minutes total were spent  coordinating discharge day today      Ruben Reason, PA-C  [facility] MEDICINE HOSPITALIST

## 2022-11-01 NOTE — Nurses Notes (Signed)
Pt.'s condition has improved to be adequate for discharge. Pt.'s belongings were left in his care. Pt.s' discharge instructions were relayed and all questions were answered. Peripheral IV access was removed. Pt. Advised that he would take care of his transportation home.

## 2022-11-02 NOTE — ED Nurses Note (Signed)
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    Prior treatment history?: No    Currently enrolled in substance use program?: No    Within the last 30 days, what substances has the patient used?: Alcohol  Patient's age at first substance use?: 15-20  Drug route of administration: Oral    Has the patient ever had sustained abstinence?: No              Family, Social, Home & Safety History  Marital Status: Married            Need to improve relationships with family?: No    Social network: Immediate family    Current living situation: Independent  Any help needed with the following?: None  Contact phone number for the patient: [phone]  Emergency contact name and phone number: Thurlow Bellissimo, wife, [phone]    Has the patient had any legal issues within the past 30 days?: None         Employment  Current employment status: Other    Needs vocational training?: No  Needs assistance with job search?: No    Engagement  Readiness ruler: 1  Summary of assessment priority areas: Substance abuse treatment    Brief Intervention  Discussed plan to reduce/quit substance use?: Yes  Discussed willingness to enter treatment?: Yes  Indicated patient's stage of change:: 1 - Precontemplation    Patient seen by Peer Recovery Coach and is a candidate for buprenorphine administration in the ED. Patient needs assessment for bup treatment.: No    Plan  Was the patient referred to treatment?: No    Was patient  referred to physician for Buprenorphine Assessment in the ED?: No    Did patient receive Narcan in the ED?: No    Plan: Additional Comments: NIAAA Safe alcohol guidelines shared with patient and long term effects of alcohol use shared as well. PRSS provided list of local resources, 12 step listings and PRSS contact information.    Follow-up           Need for additional follow-up?: No       Harle Battiest, Peer Recovery Coach 11/02/2022 14:30

## 2022-11-05 LAB — ADULT ROUTINE BLOOD CULTURE, SET OF 2 BOTTLES (BACTERIA AND YEAST)
BLOOD CULTURE, ROUTINE: NO GROWTH
BLOOD CULTURE, ROUTINE: NO GROWTH

## 2022-11-10 ENCOUNTER — Ambulatory Visit (HOSPITAL_COMMUNITY): Payer: Self-pay

## 2022-11-25 ENCOUNTER — Ambulatory Visit (HOSPITAL_COMMUNITY)
Admission: RE | Admit: 2022-11-25 | Discharge: 2022-11-25 | Disposition: A | Discharge status: Home / Self Care | Payer: Medicaid Other | Source: Ambulatory Visit

## 2022-11-25 ENCOUNTER — Other Ambulatory Visit: Payer: Self-pay

## 2022-11-25 ENCOUNTER — Ambulatory Visit: Payer: Medicaid Other | Attending: PSYCHIATRY AND NEUROLOGY-NEUROLOGY

## 2022-11-25 ENCOUNTER — Other Ambulatory Visit (HOSPITAL_COMMUNITY): Payer: Self-pay | Admitting: PSYCHIATRY AND NEUROLOGY-NEUROLOGY

## 2022-11-25 DIAGNOSIS — M542 Cervicalgia: Secondary | ICD-10-CM | POA: Insufficient documentation

## 2022-11-25 LAB — FOLATE: FOLATE: 7.5 ng/mL (ref 5.9–24.4)

## 2022-11-25 LAB — DRUG SCREEN, WITH CONFIRMATION, URINE
AMPHET QL: NEGATIVE
BARB QL: NEGATIVE
BENZO QL: NEGATIVE
BUP QL: NEGATIVE
CANNAQL: NEGATIVE
COCQL: NEGATIVE
FENTANYL, RANDOM URINE: NEGATIVE
METHQL: NEGATIVE
OPIATE: NEGATIVE
OXYCODONE URINE: NEGATIVE
PCP QL: NEGATIVE

## 2022-11-25 LAB — VITAMIN B12: VITAMIN B 12: 234 pg/mL (ref 180–914)

## 2022-11-25 LAB — THYROID STIMULATING HORMONE (SENSITIVE TSH): TSH: 2.405 u[IU]/mL (ref 0.450–5.330)

## 2022-11-29 LAB — HEP-2 SUBSTRATE ANTINUCLEAR ANTIBODIES (ANA), SERUM: ANA INTERPRETATION: NEGATIVE

## 2022-12-13 ENCOUNTER — Ambulatory Visit
Admission: RE | Admit: 2022-12-13 | Discharge: 2022-12-13 | Disposition: A | Discharge status: Home / Self Care | Payer: Medicaid Other | Source: Ambulatory Visit | Attending: Family | Admitting: Family

## 2022-12-13 ENCOUNTER — Other Ambulatory Visit: Payer: Self-pay

## 2022-12-13 DIAGNOSIS — R935 Abnormal findings on diagnostic imaging of other abdominal regions, including retroperitoneum: Secondary | ICD-10-CM | POA: Insufficient documentation

## 2022-12-13 MED ORDER — IOHEXOL 350 MG IODINE/ML INTRAVENOUS SOLUTION
100.0000 mL | INTRAVENOUS | Status: AC
Start: 2022-12-13 — End: 2022-12-13
  Administered 2022-12-13: 100 mL via INTRAVENOUS

## 2023-03-31 ENCOUNTER — Inpatient Hospital Stay (HOSPITAL_COMMUNITY): Payer: Medicaid Other | Admitting: INTERNAL MEDICINE

## 2023-03-31 ENCOUNTER — Encounter (INDEPENDENT_AMBULATORY_CARE_PROVIDER_SITE_OTHER): Payer: Self-pay | Admitting: NEUROLOGY

## 2023-03-31 ENCOUNTER — Other Ambulatory Visit: Payer: Self-pay

## 2023-03-31 ENCOUNTER — Inpatient Hospital Stay
Admission: EM | Admit: 2023-03-31 | Discharge: 2023-04-06 | DRG: 372 | Disposition: A | Discharge status: Home Health | Payer: Medicaid Other | Attending: Internal Medicine | Admitting: Internal Medicine

## 2023-03-31 DIAGNOSIS — F17210 Nicotine dependence, cigarettes, uncomplicated: Secondary | ICD-10-CM | POA: Diagnosis present

## 2023-03-31 DIAGNOSIS — E876 Hypokalemia: Secondary | ICD-10-CM | POA: Diagnosis present

## 2023-03-31 DIAGNOSIS — I10 Essential (primary) hypertension: Secondary | ICD-10-CM | POA: Diagnosis present

## 2023-03-31 DIAGNOSIS — R600 Localized edema: Secondary | ICD-10-CM | POA: Diagnosis present

## 2023-03-31 DIAGNOSIS — Z8614 Personal history of Methicillin resistant Staphylococcus aureus infection: Secondary | ICD-10-CM

## 2023-03-31 DIAGNOSIS — D72829 Elevated white blood cell count, unspecified: Secondary | ICD-10-CM | POA: Diagnosis present

## 2023-03-31 DIAGNOSIS — Z7951 Long term (current) use of inhaled steroids: Secondary | ICD-10-CM

## 2023-03-31 DIAGNOSIS — R338 Other retention of urine: Secondary | ICD-10-CM | POA: Diagnosis present

## 2023-03-31 DIAGNOSIS — N401 Enlarged prostate with lower urinary tract symptoms: Secondary | ICD-10-CM | POA: Diagnosis present

## 2023-03-31 DIAGNOSIS — J4489 Other specified chronic obstructive pulmonary disease: Secondary | ICD-10-CM | POA: Diagnosis present

## 2023-03-31 DIAGNOSIS — R160 Hepatomegaly, not elsewhere classified: Principal | ICD-10-CM

## 2023-03-31 DIAGNOSIS — Z1152 Encounter for screening for COVID-19: Secondary | ICD-10-CM

## 2023-03-31 DIAGNOSIS — K2270 Barrett's esophagus without dysplasia: Secondary | ICD-10-CM | POA: Diagnosis present

## 2023-03-31 DIAGNOSIS — Z91199 Patient's noncompliance with other medical treatment and regimen due to unspecified reason: Secondary | ICD-10-CM

## 2023-03-31 DIAGNOSIS — K529 Noninfective gastroenteritis and colitis, unspecified: Secondary | ICD-10-CM

## 2023-03-31 DIAGNOSIS — M549 Dorsalgia, unspecified: Secondary | ICD-10-CM | POA: Diagnosis present

## 2023-03-31 DIAGNOSIS — A040 Enteropathogenic Escherichia coli infection: Principal | ICD-10-CM | POA: Diagnosis present

## 2023-03-31 DIAGNOSIS — Z79899 Other long term (current) drug therapy: Secondary | ICD-10-CM

## 2023-03-31 DIAGNOSIS — K640 First degree hemorrhoids: Secondary | ICD-10-CM | POA: Diagnosis present

## 2023-03-31 DIAGNOSIS — Z91148 Patient's other noncompliance with medication regimen for other reason: Secondary | ICD-10-CM

## 2023-03-31 DIAGNOSIS — M879 Osteonecrosis, unspecified: Secondary | ICD-10-CM | POA: Diagnosis present

## 2023-03-31 DIAGNOSIS — G4733 Obstructive sleep apnea (adult) (pediatric): Secondary | ICD-10-CM | POA: Diagnosis present

## 2023-03-31 DIAGNOSIS — K219 Gastro-esophageal reflux disease without esophagitis: Secondary | ICD-10-CM | POA: Diagnosis present

## 2023-03-31 DIAGNOSIS — K760 Fatty (change of) liver, not elsewhere classified: Secondary | ICD-10-CM | POA: Diagnosis present

## 2023-03-31 DIAGNOSIS — T380X5A Adverse effect of glucocorticoids and synthetic analogues, initial encounter: Secondary | ICD-10-CM | POA: Diagnosis present

## 2023-03-31 DIAGNOSIS — K5190 Ulcerative colitis, unspecified, without complications: Secondary | ICD-10-CM | POA: Diagnosis present

## 2023-03-31 DIAGNOSIS — E785 Hyperlipidemia, unspecified: Secondary | ICD-10-CM | POA: Diagnosis present

## 2023-03-31 DIAGNOSIS — Z515 Encounter for palliative care: Secondary | ICD-10-CM

## 2023-03-31 DIAGNOSIS — M7989 Other specified soft tissue disorders: Secondary | ICD-10-CM | POA: Diagnosis present

## 2023-03-31 LAB — COMPREHENSIVE METABOLIC PANEL, NON-FASTING
ALBUMIN/GLOBULIN RATIO: 1.2 (ref 0.8–1.4)
ALBUMIN: 3.8 g/dL (ref 3.5–5.7)
ALKALINE PHOSPHATASE: 133 U/L — ABNORMAL HIGH (ref 34–104)
ALT (SGPT): 25 U/L (ref 7–52)
ANION GAP: 16 mmol/L — ABNORMAL HIGH (ref 4–13)
AST (SGOT): 72 U/L — ABNORMAL HIGH (ref 13–39)
BILIRUBIN TOTAL: 1.3 mg/dL — ABNORMAL HIGH (ref 0.3–1.0)
BUN/CREA RATIO: 6 (ref 6–22)
BUN: 3 mg/dL — ABNORMAL LOW (ref 7–25)
CALCIUM, CORRECTED: 9.1 mg/dL (ref 8.9–10.8)
CALCIUM: 8.9 mg/dL (ref 8.6–10.3)
CHLORIDE: 91 mmol/L — ABNORMAL LOW (ref 98–107)
CO2 TOTAL: 28 mmol/L (ref 21–31)
CREATININE: 0.52 mg/dL — ABNORMAL LOW (ref 0.60–1.30)
ESTIMATED GFR: 127 mL/min/{1.73_m2} (ref >59)
GLOBULIN: 3.2 (ref 2.9–5.4)
GLUCOSE: 127 mg/dL — ABNORMAL HIGH (ref 74–109)
OSMOLALITY, CALCULATED: 268 mosm/kg — ABNORMAL LOW (ref 270–290)
POTASSIUM: 2.1 mmol/L — CL (ref 3.5–5.1)
PROTEIN TOTAL: 7 g/dL (ref 6.4–8.9)
SODIUM: 135 mmol/L — ABNORMAL LOW (ref 136–145)

## 2023-03-31 LAB — CBC WITH DIFF
BASOPHIL #: 0.2 10*3/uL — ABNORMAL HIGH (ref 0.00–0.10)
BASOPHIL %: 1 % (ref 0–1)
EOSINOPHIL #: 0.2 10*3/uL (ref 0.00–0.50)
EOSINOPHIL %: 1 % (ref 1–8)
HCT: 38.3 % (ref 36.7–47.1)
HGB: 12.8 g/dL (ref 12.5–16.3)
LYMPHOCYTE #: 4.1 10*3/uL — ABNORMAL HIGH (ref 1.00–3.00)
LYMPHOCYTE %: 22 % (ref 16–44)
MCH: 34.9 pg — ABNORMAL HIGH (ref 23.8–33.4)
MCHC: 33.3 g/dL (ref 32.5–36.3)
MCV: 104.7 fL — ABNORMAL HIGH (ref 73.0–96.2)
MONOCYTE #: 1.8 10*3/uL — ABNORMAL HIGH (ref 0.30–1.00)
MONOCYTE %: 10 % (ref 5–13)
MPV: 7.9 fL (ref 7.4–11.4)
NEUTROPHIL #: 12.1 10*3/uL — ABNORMAL HIGH (ref 1.85–7.80)
NEUTROPHIL %: 66 % (ref 43–77)
PLATELETS: 363 10*3/uL (ref 140–440)
RBC: 3.66 10*6/uL — ABNORMAL LOW (ref 4.06–5.63)
RDW: 24.3 % — ABNORMAL HIGH (ref 12.1–16.2)
WBC: 18.4 10*3/uL — ABNORMAL HIGH (ref 3.6–10.2)

## 2023-03-31 LAB — SCAN DIFFERENTIAL

## 2023-03-31 MED ORDER — POTASSIUM CHLORIDE 20 MEQ/100ML IN STERILE WATER INTRAVENOUS PIGGYBACK
INJECTION | INTRAVENOUS | Status: AC
Start: 2023-03-31 — End: 2023-03-31
  Filled 2023-03-31: qty 200

## 2023-03-31 MED ORDER — ONDANSETRON HCL (PF) 4 MG/2 ML INJECTION SOLUTION
4.0000 mg | INTRAMUSCULAR | Status: AC
Start: 2023-03-31 — End: 2023-03-31
  Administered 2023-03-31: 4 mg via INTRAVENOUS

## 2023-03-31 MED ORDER — MORPHINE 4 MG/ML INJECTION WRAPPER
INJECTION | INTRAMUSCULAR | Status: AC
Start: 2023-03-31 — End: 2023-03-31
  Filled 2023-03-31: qty 1

## 2023-03-31 MED ORDER — POTASSIUM CHLORIDE 20 MEQ/100ML IN STERILE WATER INTRAVENOUS PIGGYBACK
20.0000 meq | INJECTION | INTRAVENOUS | Status: AC
Start: 2023-03-31 — End: 2023-04-01
  Administered 2023-04-01: 20 meq via INTRAVENOUS
  Administered 2023-04-01: 0 meq via INTRAVENOUS

## 2023-03-31 MED ORDER — MORPHINE 4 MG/ML INJECTION WRAPPER
4.0000 mg | INJECTION | INTRAMUSCULAR | Status: AC
Start: 2023-03-31 — End: 2023-03-31
  Administered 2023-03-31: 4 mg via INTRAVENOUS

## 2023-03-31 MED ORDER — ONDANSETRON HCL (PF) 4 MG/2 ML INJECTION SOLUTION
INTRAMUSCULAR | Status: AC
Start: 2023-03-31 — End: 2023-03-31
  Filled 2023-03-31: qty 2

## 2023-03-31 MED ORDER — POTASSIUM CHLORIDE 20 MEQ/100ML IN STERILE WATER INTRAVENOUS PIGGYBACK
20.0000 meq | INJECTION | INTRAVENOUS | Status: AC
Start: 2023-03-31 — End: 2023-04-01
  Administered 2023-03-31: 20 meq via INTRAVENOUS
  Administered 2023-04-01: 0 meq via INTRAVENOUS

## 2023-03-31 NOTE — ED Provider Notes (Signed)
[facility]  Emergency Department  Attending Provider Note      CHIEF COMPLAINT  Chief Complaint   Patient presents with    Abnormal Lab Result     HISTORY OF PRESENT ILLNESS  [name], date of birth April 15, 1976, is a 47 y.o. male who presented to the Emergency Department with multiple complaints including abdominal pain and swelling of his lower extremities.  The patient states he saw his primary care provider 2 days ago and labs were drawn in the office.  He states he was called today and told to come to the ED due to multiple abnormalities.  The patient states he had elevated blood glucose, elevated liver enzymes, and elevated white blood cell count.    PAST MEDICAL/SURGICAL/FAMILY/SOCIAL HISTORY  Past Medical History:   Diagnosis Date    Asthma     Bleeding hemorrhoid     Bleeding ulcer     Cervical herniated disc     COPD (chronic obstructive pulmonary disease) (CMS HCC)     GERD (gastroesophageal reflux disease)     Hx MRSA infection     Hyperlipidemia     Hypertension     Pre-diabetes     Rotator cuff arthropathy, right     Ulcerative colitis (CMS HCC)        Past Surgical History:   Procedure Laterality Date    HX CYST REMOVAL      lower back    HX HERNIA REPAIR      HX TONSILLECTOMY         Family Medical History:       Problem Relation (Age of Onset)    Anesthesia Complications Father    Arthritis-osteo Mother, Father    Asthma Mother, Father, Brother, Paternal Grandmother, Paternal Grandfather    Blood Clots Father, Paternal Grandmother, Paternal Grandfather    Cancer Mother, Father    Congestive Heart Failure Father, Paternal Grandmother, Paternal Grandfather    Coronary Artery Disease Father, Paternal Grandmother, Paternal Grandfather    Diabetes Mother, Father    Heart Attack Father, Paternal Grandmother, Paternal Grandfather    High Cholesterol Mother, Father    Hypertension (High Blood Pressure) Mother, Father    Stroke Father, Maternal Grandmother, Maternal  Grandfather, Paternal Grandmother, Paternal Grandfather          Social History     Socioeconomic History    Marital status: Married   Tobacco Use    Smoking status: Every Day     Current packs/day: 1.00     Average packs/day: 1 pack/day for 30.0 years (30.0 ttl pk-yrs)     Types: Cigarettes    Smokeless tobacco: Never   Vaping Use    Vaping status: Never Used   Substance and Sexual Activity    Alcohol use: Yes     Alcohol/week: 8.0 standard drinks of alcohol     Types: 8 Cans of beer per week     Comment: occasional    Drug use: Never     Social Determinants of Health     Financial Resource Strain: Low Risk  (11/11/2021)    Financial Resource Strain     SDOH Financial: No   Transportation Needs: Low Risk  (11/11/2021)    Transportation Needs     SDOH Transportation: No   Social Connections: Low Risk  (10/31/2022)    Social Connections     SDOH Social Isolation: 5 or more times a week   Intimate Partner Violence: Low Risk  (11/11/2021)  Intimate Partner Violence     SDOH Domestic Violence: No   Housing Stability: Low Risk  (11/11/2021)    Housing Stability     SDOH Housing Situation: I have housing.     SDOH Housing Worry: No      ALLERGIES  Allergies   Allergen Reactions    Noctec [Chloral Hydrate] Anaphylaxis    Penicillins Anaphylaxis    Theophylline Shortness of Breath    Flexeril [Cyclobenzaprine] Mental Status Effect     Homicidal ideation    Prednisone Myalgia    Steroids [Steroid] NO Steroids unless approved by Attending Physician       PHYSICAL EXAM  VITAL SIGNS:  Filed Vitals:    04/01/23 0015 04/01/23 0030 04/01/23 0045 04/01/23 0100   BP: 128/78 126/75 114/71 133/85   Pulse: 99 (!) 102 98 99   Resp: 14 16 (!) 24 (!) 21   Temp:       SpO2:   95% 93%     GENERAL: PATIENT IS ALERT AND ORIENTED TO PERSON, PLACE, AND TIME.  HEAD: NORMOCEPHALIC AND ATRAUMATIC.  EYES: PUPILS EQUALLY ROUND AND REACT TO LIGHT. EXTRAOCULAR MOVEMENTS INTACT.  EARS: GROSS HEARING INTACT. EXTERNAL EARS WITHIN NORMAL LIMITS.  NOSE: NO  SEPTAL DEVIATION. NASAL PASSAGES CLEAR.  MOUTH:  NORMAL DENTITION. MOIST ORAL MUCOSA.  THROAT:  NO ERYTHEMA OR EXUDATE OF THE PHARYNX.  NECK: SUPPLE. TRACHEA MIDLINE.  CARDIOVASCULAR: REGULAR RATE, AND RHYTHM. NO MURMUR.  LUNGS: CLEAR TO AUSCULTATION BILATERAL.  ABDOMEN: DISTENDED, firm to palpation, AND BOWEL SOUNDS ARE PRESENT.  GENITOURINARY: DEFERRED.  RECTAL: DEFERRED.  EXTREMITIES: NO CYANOSIS, CLUBBING, OR EDEMA.  SKIN: WARM AND DRY.  NEUROLOGIC: CRANIAL NERVES II THROUGH XII ARE GROSSLY INTACT. MOVES ALL 4 EXTREMITIES.  PSYCHIATRIC: JUDGMENT AND INSIGHT ARE SEEMINGLY INTACT. MOOD AND AFFECT ARE APPROPRIATE FOR THE SITUATION.    DIAGNOSTICS  Labs:  Labs listed below were reviewed and interpreted by me.  Results for orders placed or performed during the hospital encounter of 03/31/23   COMPREHENSIVE METABOLIC PANEL, NON-FASTING   Result Value Ref Range    SODIUM 135 (L) 136 - 145 mmol/L    POTASSIUM 2.1 (LL) 3.5 - 5.1 mmol/L    CHLORIDE 91 (L) 98 - 107 mmol/L    CO2 TOTAL 28 21 - 31 mmol/L    ANION GAP 16 (H) 4 - 13 mmol/L    BUN 3 (L) 7 - 25 mg/dL    CREATININE 7.78 (L) 0.60 - 1.30 mg/dL    BUN/CREA RATIO 6 6 - 22    ESTIMATED GFR 127 >59 mL/min/1.46m^2    ALBUMIN 3.8 3.5 - 5.7 g/dL    CALCIUM 8.9 8.6 - 24.2 mg/dL    GLUCOSE 353 (H) 74 - 109 mg/dL    ALKALINE PHOSPHATASE 133 (H) 34 - 104 U/L    ALT (SGPT) 25 7 - 52 U/L    AST (SGOT) 72 (H) 13 - 39 U/L    BILIRUBIN TOTAL 1.3 (H) 0.3 - 1.0 mg/dL    PROTEIN TOTAL 7.0 6.4 - 8.9 g/dL    ALBUMIN/GLOBULIN RATIO 1.2 0.8 - 1.4    OSMOLALITY, CALCULATED 268 (L) 270 - 290 mOsm/kg    CALCIUM, CORRECTED 9.1 8.9 - 10.8 mg/dL    GLOBULIN 3.2 2.9 - 5.4   CBC WITH DIFF   Result Value Ref Range    WBC 18.4 (H) 3.6 - 10.2 x10^3/uL    RBC 3.66 (L) 4.06 - 5.63 x10^6/uL    HGB 12.8 12.5 -  16.3 g/dL    HCT 47.8 29.5 - 62.1 %    MCV 104.7 (H) 73.0 - 96.2 fL    MCH 34.9 (H) 23.8 - 33.4 pg    MCHC 33.3 32.5 - 36.3 g/dL    RDW 30.8 (H) 65.7 - 16.2 %    PLATELETS 363 140 - 440 x10^3/uL     MPV 7.9 7.4 - 11.4 fL    NEUTROPHIL % 66 43 - 77 %    LYMPHOCYTE % 22 16 - 44 %    MONOCYTE % 10 5 - 13 %    EOSINOPHIL % 1 1 - 8 %    BASOPHIL % 1 0 - 1 %    NEUTROPHIL # 12.10 (H) 1.85 - 7.80 x10^3/uL    LYMPHOCYTE # 4.10 (H) 1.00 - 3.00 x10^3/uL    MONOCYTE # 1.80 (H) 0.30 - 1.00 x10^3/uL    EOSINOPHIL # 0.20 0.00 - 0.50 x10^3/uL    BASOPHIL # 0.20 (H) 0.00 - 0.10 x10^3/uL   BLUE TOP TUBE   Result Value Ref Range    RAINBOW/EXTRA TUBE AUTO RESULT Yes    GRAY TOP TUBE   Result Value Ref Range    RAINBOW/EXTRA TUBE AUTO RESULT Yes    SCAN DIFFERENTIAL   Result Value Ref Range    ANISOCYTOSIS 1+ (10-25%)     POIKILOCYTOSIS 1+ (10-25%)     MACROCYTOSIS 1+ (10-25%)     TARGET CELLS 1+ (10-25%)     STOMATOCYTES 1+ (10-25%)     PLATELET CLUMPS Present    AMMONIA   Result Value Ref Range    AMMONIA 30 16 - 53 umol/L     Radiology:  Results for orders placed or performed during the hospital encounter of 03/31/23   CT ABDOMEN PELVIS W IV CONTRAST     Status: None    Narrative    SR. Terryon ANDREW Bastarache SR.    RADIOLOGIST: Carma Lair    CT ABDOMEN PELVIS W IV CONTRAST performed on 04/01/2023 1:08 AM    CLINICAL HISTORY: diffuse abdominal pain and swelling.  diffuse abdominal pain and swelling, hx. hernia repair    TECHNIQUE:  Abdomen and pelvis CT with intravenous contrast.  IV CONTRAST: 75 ml's of Omnipaque 350    COMPARISON:  12/13/2022 and 10/31/2022    FINDINGS:  Lung bases: Clear    Liver:   Hepatic steatosis. The liver is enlarged.    Gallbladder:   There is probably some layering sludge or stones.    Spleen:   Unremarkable.    Pancreas:   Unremarkable.    Adrenals:   Unremarkable.    Kidneys:   There is a cyst in the right kidney. No hydronephrosis.    Bladder:  The bladder is distended. There is some circumferential bladder wall thickening.  Prostate:  Unremarkable.    Bowel:   There is diffuse wall thickening of the colon most consistent with colitis. This may BE infectious or inflammatory in nature. There  is no large or small bowel dilatation.    Appendix:  Normal.    Lymph nodes:  No suspicious lymph node enlargement.    Vasculature:   Mild diffuse atherosclerotic calcifications are noted.     Peritoneum / Retroperitoneum: No ascites.  No free air.    Bones:   Unremarkable.          Impression    Diffuse wall thickening of the colon most consistent with colitis. This may BE infectious or inflammatory nature.  A follow-up colonoscopy is recommended for better evaluation of the colon.    Other findings as above.       One or more dose reduction techniques were used (e.g., Automated exposure control, adjustment of the mA and/or kV according to patient size, use of iterative reconstruction technique).      Radiologist location ID: [id number]         ED COURSE/MEDICAL DECISION MAKING  Medications Administered in the ED   potassium chloride 20 mEq in SW 100 mL premix infusion (0 mEq Intravenous Stopped 04/01/23 0040)   potassium chloride 20 mEq in SW 100 mL premix infusion (20 mEq Intravenous New Bag/New Syringe 04/01/23 0041)   morphine 4 mg/mL injection (4 mg Intravenous Given 03/31/23 2309)   ondansetron (ZOFRAN) 2 mg/mL injection (4 mg Intravenous Given 03/31/23 2309)   iohexol (OMNIPAQUE 350) infusion (75 mL Intravenous Given 04/01/23 0108)          Medical Decision Making  The patient presents to the ED with diffuse abdominal pain, swelling of the lower extremities, and abnormal labs that were drawn by his primary care provider.  Labs were repeated in the ED.  Potassium 2.1.  The patient has elevated white blood cell count 18.4.  A CT abdomen was obtained that revealed significant hepatomegaly and hepatic steatosis.  The patient also has colitis per Radiology.  The patient did receive 40 mEq of potassium IV in the ED.  The case was discussed with the hospitalist and the patient will be admitted.          CLINICAL IMPRESSION  Clinical Impression   Hepatomegaly (Primary)   Hypokalemia   Colitis      DISPOSITION  Admitted       DISCHARGE MEDICATIONS  Current Discharge Medication List          Madelaine Bhat Hyacinth Meeker D.O.   03/31/2023, 23:06   [facility]  Department of Emergency Medicine  [facility]    Contents of the document, in whole or in part, are completed utilizing M*Modal dictation technology, please forgive any typographical errors that may exist.   -----

## 2023-03-31 NOTE — ED APP Handoff Note (Signed)
[facility] - Emergency Department  Emergency Department  Provider in Triage Note    Name: [name]  Age: 47 y.o.  Gender: male     Subjective:   [name] is a 47 y.o. male who presents with complaint of Abnormal Lab Result  .  Patient reports being alerted by PCP of abnormal lab values received in clinic.    Objective:   Filed Vitals:    03/31/23 2141   BP: 119/68   Pulse: 88   Resp: 20   Temp: 36.7 C (98 F)   SpO2: 99%      Focused Physical Exam shows abnormal labs in clinic.    Assessment:  A medical screening exam was completed.  This patient is a 47 y.o. male with initial findings showing no acute complaints voiced.  Was advised to report to ER for abnormal labs at PCP clinic.    Plan:  Please see initial orders and work-up below.  This is to be continued with full evaluation in the main Emergency Department.     No current facility-administered medications for this encounter.     Results for orders placed or performed during the hospital encounter of 03/31/23 (from the past 24 hour(s))   CBC/DIFF    Narrative    The following orders were created for panel order CBC/DIFF.  Procedure                               Abnormality         Status                     ---------                               -----------         ------                     CBC WITH DIFF[[id number]]                                                                 Please view results for these tests on the individual orders.        Corinna Lines, FNP-BC  03/31/2023, 21:40

## 2023-03-31 NOTE — ED Triage Notes (Signed)
Patient reports he was sent from PCP due to multiple abnormal lab results that were drawn a couple days so. Patient reports he has been having BLE edema, numbness in lips.

## 2023-04-01 ENCOUNTER — Emergency Department (HOSPITAL_COMMUNITY): Payer: Medicaid Other

## 2023-04-01 DIAGNOSIS — F17210 Nicotine dependence, cigarettes, uncomplicated: Secondary | ICD-10-CM

## 2023-04-01 DIAGNOSIS — E876 Hypokalemia: Secondary | ICD-10-CM

## 2023-04-01 DIAGNOSIS — R600 Localized edema: Secondary | ICD-10-CM

## 2023-04-01 LAB — BASIC METABOLIC PANEL
ANION GAP: 9 mmol/L (ref 4–13)
ANION GAP: 10 mmol/L (ref 4–13)
BUN/CREA RATIO: 5 — ABNORMAL LOW (ref 6–22)
BUN/CREA RATIO: 5 — ABNORMAL LOW (ref 6–22)
BUN: 3 mg/dL — ABNORMAL LOW (ref 7–25)
BUN: 3 mg/dL — ABNORMAL LOW (ref 7–25)
CALCIUM: 8.2 mg/dL — ABNORMAL LOW (ref 8.6–10.3)
CALCIUM: 8.1 mg/dL — ABNORMAL LOW (ref 8.6–10.3)
CHLORIDE: 95 mmol/L — ABNORMAL LOW (ref 98–107)
CHLORIDE: 96 mmol/L — ABNORMAL LOW (ref 98–107)
CO2 TOTAL: 34 mmol/L — ABNORMAL HIGH (ref 21–31)
CO2 TOTAL: 31 mmol/L (ref 21–31)
CREATININE: 0.55 mg/dL — ABNORMAL LOW (ref 0.60–1.30)
CREATININE: 0.56 mg/dL — ABNORMAL LOW (ref 0.60–1.30)
ESTIMATED GFR: 125 mL/min/{1.73_m2} (ref >59)
ESTIMATED GFR: 125 mL/min/{1.73_m2} (ref >59)
GLUCOSE: 128 mg/dL — ABNORMAL HIGH (ref 74–109)
GLUCOSE: 128 mg/dL — ABNORMAL HIGH (ref 74–109)
OSMOLALITY, CALCULATED: 274 mosm/kg (ref 270–290)
OSMOLALITY, CALCULATED: 272 mosm/kg (ref 270–290)
POTASSIUM: 2.4 mmol/L — CL (ref 3.5–5.1)
POTASSIUM: 2.9 mmol/L — ABNORMAL LOW (ref 3.5–5.1)
SODIUM: 138 mmol/L (ref 136–145)
SODIUM: 137 mmol/L (ref 136–145)

## 2023-04-01 LAB — URINALYSIS, MACROSCOPIC
BILIRUBIN: NEGATIVE mg/dL
BLOOD: NEGATIVE mg/dL
GLUCOSE: NEGATIVE mg/dL
KETONES: NEGATIVE mg/dL
LEUKOCYTES: NEGATIVE WBCs/uL
NITRITE: NEGATIVE
PH: 7 (ref 5.0–9.0)
PROTEIN: NEGATIVE mg/dL
SPECIFIC GRAVITY: 1.02 (ref 1.002–1.030)
UROBILINOGEN: 6 mg/dL — AB

## 2023-04-01 LAB — LAVENDER TOP TUBE

## 2023-04-01 LAB — URINALYSIS, MICROSCOPIC
NON-SQUAMOUS EPITHELIAL CELLS URINE: <1 /[HPF] (ref <=1)
SQUAMOUS EPITHELIAL: <1 /[HPF] (ref <28)
WBCS: 1 /[HPF] (ref <6)

## 2023-04-01 LAB — AMMONIA: AMMONIA: 30 umol/L (ref 16–53)

## 2023-04-01 LAB — C. DIFFICILE PCR
C. DIFFICILE TOXIN GENE, PCR: NEGATIVE
PRESUMPTIVE 027/NAP1/BI: NEGATIVE

## 2023-04-01 LAB — PTT (PARTIAL THROMBOPLASTIN TIME): APTT: 29 s (ref 25.0–38.0)

## 2023-04-01 LAB — MAGNESIUM
MAGNESIUM: 1.6 mg/dL — ABNORMAL LOW (ref 1.9–2.7)
MAGNESIUM: 2.6 mg/dL (ref 1.9–2.7)

## 2023-04-01 LAB — BLUE TOP TUBE

## 2023-04-01 LAB — PT/INR
INR: 1.19 — ABNORMAL HIGH (ref 0.84–1.10)
PROTHROMBIN TIME: 13.9 s — ABNORMAL HIGH (ref 9.8–12.7)

## 2023-04-01 LAB — VITAMIN B12: VITAMIN B 12: 392 pg/mL (ref 180–914)

## 2023-04-01 LAB — GOLD TOP TUBE

## 2023-04-01 LAB — GRAY TOP TUBE

## 2023-04-01 LAB — TROPONIN-I: TROPONIN I: 8 ng/L (ref <20)

## 2023-04-01 MED ORDER — POTASSIUM CHLORIDE 20 MEQ/100ML IN STERILE WATER INTRAVENOUS PIGGYBACK
20.0000 meq | INJECTION | Freq: Once | INTRAVENOUS | Status: DC
Start: 2023-04-01 — End: 2023-04-01

## 2023-04-01 MED ORDER — PANTOPRAZOLE 40 MG TABLET,DELAYED RELEASE
40.0000 mg | DELAYED_RELEASE_TABLET | Freq: Every day | ORAL | Status: DC
Start: 2023-04-01 — End: 2023-04-06
  Administered 2023-04-01 – 2023-04-06 (×6): 40 mg via ORAL
  Filled 2023-04-01 (×7): qty 1

## 2023-04-01 MED ORDER — FAMOTIDINE 20 MG TABLET
40.0000 mg | ORAL_TABLET | Freq: Two times a day (BID) | ORAL | Status: DC
Start: 2023-04-01 — End: 2023-04-06
  Administered 2023-04-01 – 2023-04-06 (×11): 40 mg via ORAL
  Filled 2023-04-01 (×11): qty 2

## 2023-04-01 MED ORDER — RAMELTEON 8 MG TABLET
8.0000 mg | ORAL_TABLET | Freq: Every evening | ORAL | Status: DC
Start: 2023-04-01 — End: 2023-04-06
  Administered 2023-04-01 – 2023-04-02 (×2): 8 mg via ORAL
  Administered 2023-04-03: 0 mg via ORAL
  Administered 2023-04-04 – 2023-04-05 (×2): 8 mg via ORAL
  Filled 2023-04-01 (×6): qty 1

## 2023-04-01 MED ORDER — IOHEXOL 350 MG IODINE/ML INTRAVENOUS SOLUTION
100.0000 mL | INTRAVENOUS | Status: AC
Start: 2023-04-01 — End: 2023-04-01
  Administered 2023-04-01: 75 mL via INTRAVENOUS

## 2023-04-01 MED ORDER — POTASSIUM CHLORIDE 20 MEQ/100ML IN STERILE WATER INTRAVENOUS PIGGYBACK
20.0000 meq | INJECTION | Freq: Once | INTRAVENOUS | Status: DC
Start: 2023-04-01 — End: 2023-04-01
  Filled 2023-04-01: qty 100

## 2023-04-01 MED ORDER — LEVOFLOXACIN 750 MG/150 ML IN 5 % DEXTROSE INTRAVENOUS PIGGYBACK
750.0000 mg | INJECTION | INTRAVENOUS | Status: DC
Start: 2023-04-01 — End: 2023-04-06
  Administered 2023-04-01: 750 mg via INTRAVENOUS
  Administered 2023-04-01: 0 mg via INTRAVENOUS
  Administered 2023-04-02: 750 mg via INTRAVENOUS
  Administered 2023-04-02: 0 mg via INTRAVENOUS
  Administered 2023-04-03: 750 mg via INTRAVENOUS
  Administered 2023-04-03 – 2023-04-04 (×2): 0 mg via INTRAVENOUS
  Administered 2023-04-04: 750 mg via INTRAVENOUS
  Administered 2023-04-05: 0 mg via INTRAVENOUS
  Administered 2023-04-05: 750 mg via INTRAVENOUS
  Administered 2023-04-06: 0 mg via INTRAVENOUS
  Administered 2023-04-06: 750 mg via INTRAVENOUS
  Filled 2023-04-01 (×6): qty 150

## 2023-04-01 MED ORDER — POTASSIUM CHLORIDE ER 20 MEQ TABLET,EXTENDED RELEASE(PART/CRYST)
40.0000 meq | ORAL_TABLET | Freq: Three times a day (TID) | ORAL | Status: AC
Start: 2023-04-01 — End: 2023-04-02
  Administered 2023-04-01 – 2023-04-02 (×4): 40 meq via ORAL
  Filled 2023-04-01 (×3): qty 2

## 2023-04-01 MED ORDER — MESALAMINE 400 MG CAPSULE (WITH DELAYED RELEASE TABLETS INSIDE)
800.0000 mg | ORAL_CAPSULE | Freq: Two times a day (BID) | ORAL | Status: DC
Start: 2023-04-01 — End: 2023-04-06
  Administered 2023-04-01 – 2023-04-06 (×11): 800 mg via ORAL
  Filled 2023-04-01 (×12): qty 2

## 2023-04-01 MED ORDER — TAMSULOSIN 0.4 MG CAPSULE
0.4000 mg | ORAL_CAPSULE | Freq: Every evening | ORAL | Status: DC
Start: 2023-04-01 — End: 2023-04-06
  Administered 2023-04-01 – 2023-04-04 (×4): 0.4 mg via ORAL
  Administered 2023-04-05: 0 mg via ORAL
  Filled 2023-04-01 (×4): qty 1

## 2023-04-01 MED ORDER — SPIRONOLACTONE 25 MG TABLET
50.0000 mg | ORAL_TABLET | Freq: Every day | ORAL | Status: DC
Start: 2023-04-01 — End: 2023-04-06
  Administered 2023-04-01 – 2023-04-06 (×6): 50 mg via ORAL
  Filled 2023-04-01 (×7): qty 2

## 2023-04-01 MED ORDER — MAGNESIUM SULFATE 1 GRAM/100 ML IN DEXTROSE 5 % INTRAVENOUS PIGGYBACK
1.0000 g | INJECTION | INTRAVENOUS | Status: AC
Start: 2023-04-01 — End: 2023-04-01
  Administered 2023-04-01: 1 g via INTRAVENOUS
  Administered 2023-04-01: 0 g via INTRAVENOUS
  Administered 2023-04-01: 1 g via INTRAVENOUS
  Administered 2023-04-01: 0 g via INTRAVENOUS
  Filled 2023-04-01 (×2): qty 100

## 2023-04-01 MED ORDER — POTASSIUM CHLORIDE 20 MEQ/100ML IN STERILE WATER INTRAVENOUS PIGGYBACK
20.0000 meq | INJECTION | Freq: Once | INTRAVENOUS | Status: AC
Start: 2023-04-01 — End: 2023-04-01
  Administered 2023-04-01: 0 meq via INTRAVENOUS
  Administered 2023-04-01: 20 meq via INTRAVENOUS
  Filled 2023-04-01: qty 100

## 2023-04-01 MED ORDER — POTASSIUM CHLORIDE ER 20 MEQ TABLET,EXTENDED RELEASE(PART/CRYST)
40.0000 meq | ORAL_TABLET | Freq: Every morning | ORAL | Status: DC
Start: 2023-04-01 — End: 2023-04-01
  Administered 2023-04-01: 0 meq via ORAL
  Filled 2023-04-01: qty 2

## 2023-04-01 MED ORDER — ONDANSETRON HCL (PF) 4 MG/2 ML INJECTION SOLUTION
4.0000 mg | Freq: Three times a day (TID) | INTRAMUSCULAR | Status: DC | PRN
Start: 2023-04-01 — End: 2023-04-06
  Administered 2023-04-01: 4 mg via INTRAVENOUS
  Filled 2023-04-01: qty 2

## 2023-04-01 MED ORDER — METRONIDAZOLE 500 MG/100 ML IN SODIUM CHLOR(ISO) INTRAVENOUS PIGGYBACK
500.0000 mg | INJECTION | Freq: Two times a day (BID) | INTRAVENOUS | Status: DC
Start: 2023-04-01 — End: 2023-04-06
  Administered 2023-04-01: 0 mg via INTRAVENOUS
  Administered 2023-04-01: 500 mg via INTRAVENOUS
  Administered 2023-04-01: 0 mg via INTRAVENOUS
  Administered 2023-04-01: 500 mg via INTRAVENOUS
  Administered 2023-04-02: 0 mg via INTRAVENOUS
  Administered 2023-04-02 (×2): 500 mg via INTRAVENOUS
  Administered 2023-04-02: 0 mg via INTRAVENOUS
  Administered 2023-04-03: 500 mg via INTRAVENOUS
  Administered 2023-04-03: 0 mg via INTRAVENOUS
  Administered 2023-04-03: 500 mg via INTRAVENOUS
  Administered 2023-04-03: 0 mg via INTRAVENOUS
  Administered 2023-04-04: 500 mg via INTRAVENOUS
  Administered 2023-04-04: 0 mg via INTRAVENOUS
  Administered 2023-04-04: 500 mg via INTRAVENOUS
  Administered 2023-04-04 – 2023-04-05 (×3): 0 mg via INTRAVENOUS
  Administered 2023-04-05 (×2): 500 mg via INTRAVENOUS
  Administered 2023-04-06: 0 mg via INTRAVENOUS
  Administered 2023-04-06: 500 mg via INTRAVENOUS
  Filled 2023-04-01 (×11): qty 100

## 2023-04-01 MED ORDER — ENOXAPARIN 40 MG/0.4 ML SUBCUTANEOUS SYRINGE
40.0000 mg | INJECTION | SUBCUTANEOUS | Status: DC
Start: 2023-04-01 — End: 2023-04-06
  Administered 2023-04-01 – 2023-04-04 (×4): 40 mg via SUBCUTANEOUS
  Administered 2023-04-05: 0 mg via SUBCUTANEOUS
  Administered 2023-04-06: 40 mg via SUBCUTANEOUS
  Filled 2023-04-01 (×6): qty 0.4

## 2023-04-01 MED ORDER — SODIUM CHLORIDE 0.9 % INTRAVENOUS SOLUTION
INTRAVENOUS | Status: AC
Start: 2023-04-01 — End: 2023-04-02

## 2023-04-01 MED ORDER — POTASSIUM CHLORIDE 20 MEQ/100ML IN STERILE WATER INTRAVENOUS PIGGYBACK
20.0000 meq | INJECTION | INTRAVENOUS | Status: AC
Start: 2023-04-01 — End: 2023-04-01
  Administered 2023-04-01: 0 meq via INTRAVENOUS
  Administered 2023-04-01: 20 meq via INTRAVENOUS
  Administered 2023-04-01: 0 meq via INTRAVENOUS
  Administered 2023-04-01 (×2): 20 meq via INTRAVENOUS
  Administered 2023-04-01: 0 meq via INTRAVENOUS
  Filled 2023-04-01 (×2): qty 100

## 2023-04-01 MED ORDER — MAGNESIUM SULFATE 1 GRAM/100 ML IN DEXTROSE 5 % INTRAVENOUS PIGGYBACK
1.0000 g | INJECTION | Freq: Once | INTRAVENOUS | Status: AC
Start: 2023-04-01 — End: 2023-04-03
  Administered 2023-04-01: 0 g via INTRAVENOUS
  Administered 2023-04-03: 1 g via INTRAVENOUS
  Administered 2023-04-03: 0 g via INTRAVENOUS
  Filled 2023-04-01: qty 100

## 2023-04-01 MED ORDER — CHOLESTYRAMINE (WITH SUGAR) 4 GRAM POWDER FOR SUSP IN A PACKET
1.0000 | Freq: Every day | ORAL | Status: DC
Start: 2023-04-01 — End: 2023-04-06
  Administered 2023-04-01 – 2023-04-06 (×6): 1 via ORAL
  Filled 2023-04-01 (×7): qty 1

## 2023-04-01 MED ORDER — POTASSIUM CHLORIDE ER 20 MEQ TABLET,EXTENDED RELEASE(PART/CRYST)
ORAL_TABLET | ORAL | Status: AC
Start: 2023-04-01 — End: 2023-04-01
  Filled 2023-04-01: qty 2

## 2023-04-01 MED ORDER — IPRATROPIUM 0.5 MG-ALBUTEROL 3 MG (2.5 MG BASE)/3 ML NEBULIZATION SOLN
3.0000 mL | INHALATION_SOLUTION | RESPIRATORY_TRACT | Status: DC | PRN
Start: 2023-04-01 — End: 2023-04-06
  Administered 2023-04-03 – 2023-04-05 (×2): 3 mL via RESPIRATORY_TRACT

## 2023-04-01 MED ORDER — MAGNESIUM SULFATE 1 GRAM/100 ML IN DEXTROSE 5 % INTRAVENOUS PIGGYBACK
1.0000 g | INJECTION | Freq: Once | INTRAVENOUS | Status: AC
Start: 2023-04-01 — End: 2023-04-01
  Administered 2023-04-01: 1 g via INTRAVENOUS
  Administered 2023-04-01: 0 g via INTRAVENOUS
  Filled 2023-04-01: qty 100

## 2023-04-01 MED ORDER — BUDESONIDE-FORMOTEROL HFA 160 MCG-4.5 MCG/ACTUATION AEROSOL INHALER
2.0000 | INHALATION_SPRAY | Freq: Two times a day (BID) | RESPIRATORY_TRACT | Status: DC
Start: 2023-04-01 — End: 2023-04-06
  Administered 2023-04-01 – 2023-04-05 (×9): 0 via RESPIRATORY_TRACT
  Administered 2023-04-05 – 2023-04-06 (×2): 2 via RESPIRATORY_TRACT

## 2023-04-01 MED ORDER — MAGNESIUM SULFATE 1 GRAM/100 ML IN DEXTROSE 5 % INTRAVENOUS PIGGYBACK
1.0000 g | INJECTION | Freq: Once | INTRAVENOUS | Status: AC
Start: 2023-04-01 — End: 2023-04-01
  Administered 2023-04-01: 0 g via INTRAVENOUS
  Administered 2023-04-01: 1 g via INTRAVENOUS

## 2023-04-01 MED ORDER — POTASSIUM CHLORIDE ER 20 MEQ TABLET,EXTENDED RELEASE(PART/CRYST)
40.0000 meq | ORAL_TABLET | ORAL | Status: AC
Start: 2023-04-01 — End: 2023-04-01
  Administered 2023-04-01: 40 meq via ORAL

## 2023-04-01 MED ORDER — NICOTINE 21 MG/24 HR DAILY TRANSDERMAL PATCH
21.0000 mg | MEDICATED_PATCH | TRANSDERMAL | Status: DC
Start: 2023-04-01 — End: 2023-04-06
  Administered 2023-04-01 – 2023-04-06 (×6): 21 mg via TRANSDERMAL
  Filled 2023-04-01 (×6): qty 1

## 2023-04-01 NOTE — Care Plan (Signed)
Admit with diagnosis of Hypokalemia.  History of ulcerative colitis with frequent diarrhea.  Received potassium replacement in ER.  See EMR for vitals/physical assessment.      Problem: Adult Inpatient Plan of Care  Goal: Patient-Specific Goal (Individualized)  Outcome: Ongoing (see interventions/notes)  Flowsheets (Taken 04/01/2023 0520)  Individualized Care Needs: Monitor for signs/symptoms of hypokalemia  Anxieties, Fears or Concerns: Fears extended hospital stay  Patient-Specific Goals (Include Timeframe): Normal electrolytes

## 2023-04-01 NOTE — Nurses Notes (Signed)
Received to [address] from the Emergency Department.  Able to ambulate to bed.  States that he does not want to wear a hospital gown.  See EMR for full physical assessment.

## 2023-04-01 NOTE — H&P (Signed)
[facility]  Admission H&P        DATE OF SERVICE:  04/01/2023  [name] 64 y.o. male  DATE OF ADMISSION:  03/31/2023  DATE OF BIRTH:  1975/12/16      CHIEF COMPLAINT:  hypokalemia    HPI: [name] is a 47 y.o. male with a PMH of COPD, ongoing tobacco abuse, ulcerative colitis, GERD, HLD, HTN, BPH, who presents with abnormal labs from his PCP (potassium 2.1).    Patient went to see PCP for difficulty urinating, labs were drawn and patient was told to home to the ER after potassium was found to be low. Patient reports this has been a recurrent issue for him, when he takes potassium regularly he gets high potassium and gets taken off. He reports chronic diarrhea (5-6x/d) due to UC, follows with Dr.Patel, but feels he has not improved much. He reports difficulty emptying his bladder, as well as pain with trying to urinate. He reports bilateral LE edema, abdominal edema, abdominal pain, chest pain, shortness of breath, back pain. Pt smokes 1 PPD, drinks 3-4 alcoholic beverages per day.Patient was admitted in May with nearly identical complaints. Patient is noted to be non-compliant with medications and follow up appointments      CODE STATUS:  Prior    ASSESSMENT/PLAN:   Active Hospital Problems    Diagnosis    Primary Problem: Hypokalemia       #Hypokalemia  Likely 2/2 chronic diarrhea with poorly controlled ulcerative colitis  -tele  -Replace  -check Mg  -resume home aldactone    #BPH  #Difficulty urinating  Chronic issue, improves on Flomax but ran out.  -re-start flomax  -Bladder scan  -UA    #Ulcerative Colitis  -continue mesalamine  -needs to follow up with GI    #Leukocytosis  Appears to be chronic, likely 2/2 UC  -check UA    #hx of borderline B12  -Check B12    #LE/abdominal edema  -trace pitting edema b/l LE  -Resume home spironolactone    #Hepatic steatosis  -finding on CT    #Tobacco use  -prn NRT    #EOTH use  -reports 3-4 drinks daily    #Hx of COPD    #Hx of  OSA  -no longer has CPAP due to noncompliance    DVT/PE Prophylaxis: lovenox  Disposition Planning: inpatient    ----------------------------------------------------------------------------------------------------------------------------------------------------------------------------------------------------------------------------------------------------------------  History:    Past Medical:    Past Medical History:   Diagnosis Date    Asthma     Bleeding hemorrhoid     Bleeding ulcer     Cervical herniated disc     COPD (chronic obstructive pulmonary disease) (CMS HCC)     GERD (gastroesophageal reflux disease)     Hx MRSA infection     Hyperlipidemia     Hypertension     Pre-diabetes     Rotator cuff arthropathy, right     Ulcerative colitis (CMS HCC)      Past Surgical:    Past Surgical History:   Procedure Laterality Date    HX CYST REMOVAL      lower back    HX HERNIA REPAIR      HX TONSILLECTOMY       Family:    Family Medical History:       Problem Relation (Age of Onset)    Anesthesia Complications Father    Arthritis-osteo Mother, Father    Asthma Mother, Father, Brother, Paternal Grandmother, Paternal Actor  Blood Clots Father, Paternal Grandmother, Paternal Grandfather    Cancer Mother, Father    Congestive Heart Failure Father, Paternal Grandmother, Paternal Grandfather    Coronary Artery Disease Father, Paternal Grandmother, Paternal Grandfather    Diabetes Mother, Father    Heart Attack Father, Paternal Grandmother, Paternal Grandfather    High Cholesterol Mother, Father    Hypertension (High Blood Pressure) Mother, Father    Stroke Father, Maternal Grandmother, Maternal Grandfather, Paternal Grandmother, Paternal Grandfather          Social:   reports that he has been smoking cigarettes. He has a 30 pack-year smoking history. He has never used smokeless tobacco. He reports current alcohol use of about 8.0 standard drinks of alcohol per week. He reports that he does not use  drugs.    Allergies   Allergen Reactions    Noctec [Chloral Hydrate] Anaphylaxis    Penicillins Anaphylaxis    Theophylline Shortness of Breath    Flexeril [Cyclobenzaprine] Mental Status Effect     Homicidal ideation    Prednisone Myalgia    Steroids [Steroid] NO Steroids unless approved by Attending Physician     Medications Prior to Admission       Prescriptions    albuterol sulfate (PROVENTIL OR VENTOLIN OR PROAIR) 90 mcg/actuation Inhalation oral inhaler    Take 1-2 Puffs by inhalation Every 6 hours as needed    BROMPHENIR-DIPHENHYD-PHENYLEPH ORAL    Take by mouth    budesonide-formoteroL (SYMBICORT) 160-4.5 mcg/actuation Inhalation oral inhaler    Take 2 Puffs by inhalation Twice daily    cholestyramine-sucrose (QUESTRAN) 4 gram Oral Powder in Packet    Take 1 Packet by mouth Once a day    famotidine (PEPCID) 40 mg Oral Tablet    Take 1 Tablet (40 mg total) by mouth Twice daily    Mesalamine (APRISO) 0.375 gram Oral Capsule, Sust. Release 24 hr    Take 4 Capsules (1.5 g total) by mouth Once a day    omeprazole (PRILOSEC) 40 mg Oral Capsule, Delayed Release(E.C.)    Take 1 Capsule (40 mg total) by mouth Once a day    Pseudoephedrine-Guaifenesin (MUCINEX D) 60-600 mg Oral Tablet Sustained Release 12 hr    Take 1 Tablet by mouth Every 12 hours    spironolactone (ALDACTONE) 50 mg Oral Tablet    Take 1 Tablet (50 mg total) by mouth Once a day    tamsulosin (FLOMAX) 0.4 mg Oral Capsule    Take 1 Capsule (0.4 mg total) by mouth Every evening after dinner for 30 days          No current facility-administered medications for this encounter.      ROS:   ROS    All other systems negative unless marked.      PHYSICAL EXAM  Vitals:    04/01/23 0145 04/01/23 0200 04/01/23 0215 04/01/23 0230   BP: 124/79 132/81 132/86 126/83   Pulse: 95 96 (!) 102 (!) 104   Resp: 19 12 (!) 27 13   Temp:       SpO2: 95%      Weight:       Height:       BMI:               Intake/Output Summary (Last 24 hours) at 04/01/2023 0246  Last data  filed at 04/01/2023 0040  Gross per 24 hour   Intake 100 ml   Output --   Net 100 ml  General:  Alert and oriented x3, no acute distress  HEENT:  Pupils equal round and reactive to light, extraocular movements intact, no scleral icterus, mucous membranes moist  Neck:  Supple, midline  Cardiovascular:  Heart regular rate and rhythm, no murmurs rubs or gallops, normal S1-S2  Lungs:  no increased WOB, bilateral expiratory wheezing.   Abdomen: firm, TTP LLQ, no rebound or guarding.   Extremities:Trace bilateral LE edema  Skin:  No rashes or lesions  Neuro:  Cranial nerves II-XII grossly intact, moving all extremities  Psych: normal mood and affect    Physical Exam     LABS:     Results for orders placed or performed during the hospital encounter of 03/31/23 (from the past 24 hour(s))   COMPREHENSIVE METABOLIC PANEL, NON-FASTING   Result Value Ref Range    SODIUM 135 (L) 136 - 145 mmol/L    POTASSIUM 2.1 (LL) 3.5 - 5.1 mmol/L    CHLORIDE 91 (L) 98 - 107 mmol/L    CO2 TOTAL 28 21 - 31 mmol/L    ANION GAP 16 (H) 4 - 13 mmol/L    BUN 3 (L) 7 - 25 mg/dL    CREATININE 5.42 (L) 0.60 - 1.30 mg/dL    BUN/CREA RATIO 6 6 - 22    ESTIMATED GFR 127 >59 mL/min/1.30m^2    ALBUMIN 3.8 3.5 - 5.7 g/dL    CALCIUM 8.9 8.6 - 70.6 mg/dL    GLUCOSE 237 (H) 74 - 109 mg/dL    ALKALINE PHOSPHATASE 133 (H) 34 - 104 U/L    ALT (SGPT) 25 7 - 52 U/L    AST (SGOT) 72 (H) 13 - 39 U/L    BILIRUBIN TOTAL 1.3 (H) 0.3 - 1.0 mg/dL    PROTEIN TOTAL 7.0 6.4 - 8.9 g/dL    ALBUMIN/GLOBULIN RATIO 1.2 0.8 - 1.4    OSMOLALITY, CALCULATED 268 (L) 270 - 290 mOsm/kg    CALCIUM, CORRECTED 9.1 8.9 - 10.8 mg/dL    GLOBULIN 3.2 2.9 - 5.4    Narrative    Estimated Glomerular Filtration Rate (eGFR) is calculated using the CKD-EPI (2021) equation, intended for patients 8 years of age and older. If gender is not documented or "unknown", there will be no eGFR calculation.     CBC/DIFF    Narrative    The following orders were created for panel order CBC/DIFF.  Procedure                                Abnormality         Status                     ---------                               -----------         ------                     CBC WITH DIFF[[id number]]                Abnormal            Final result                 Please view results for these tests on the individual orders.   CBC WITH DIFF   Result Value  Ref Range    WBC 18.4 (H) 3.6 - 10.2 x10^3/uL    RBC 3.66 (L) 4.06 - 5.63 x10^6/uL    HGB 12.8 12.5 - 16.3 g/dL    HCT 73.7 10.6 - 26.9 %    MCV 104.7 (H) 73.0 - 96.2 fL    MCH 34.9 (H) 23.8 - 33.4 pg    MCHC 33.3 32.5 - 36.3 g/dL    RDW 48.5 (H) 46.2 - 16.2 %    PLATELETS 363 140 - 440 x10^3/uL    MPV 7.9 7.4 - 11.4 fL    NEUTROPHIL % 66 43 - 77 %    LYMPHOCYTE % 22 16 - 44 %    MONOCYTE % 10 5 - 13 %    EOSINOPHIL % 1 1 - 8 %    BASOPHIL % 1 0 - 1 %    NEUTROPHIL # 12.10 (H) 1.85 - 7.80 x10^3/uL    LYMPHOCYTE # 4.10 (H) 1.00 - 3.00 x10^3/uL    MONOCYTE # 1.80 (H) 0.30 - 1.00 x10^3/uL    EOSINOPHIL # 0.20 0.00 - 0.50 x10^3/uL    BASOPHIL # 0.20 (H) 0.00 - 0.10 x10^3/uL   EXTRA TUBES    Narrative    The following orders were created for panel order EXTRA TUBES.  Procedure                               Abnormality         Status                     ---------                               -----------         ------                     BLUE TOP VOJJ[[id number]]                                    Final result               GOLD TOP HBZJ[[id number]]                                    In process                 GRAY TOP OFBP[[id number]]                                    Final result                 Please view results for these tests on the individual orders.   BLUE TOP TUBE   Result Value Ref Range    RAINBOW/EXTRA TUBE AUTO RESULT Yes    GRAY TOP TUBE   Result Value Ref Range    RAINBOW/EXTRA TUBE AUTO RESULT Yes    SCAN DIFFERENTIAL   Result Value Ref Range    ANISOCYTOSIS 1+ (10-25%)     POIKILOCYTOSIS 1+ (10-25%)     MACROCYTOSIS 1+ (10-25%)     TARGET CELLS 1+ (10-25%)  STOMATOCYTES 1+ (10-25%)     PLATELET CLUMPS Present    AMMONIA   Result Value Ref Range    AMMONIA 30 16 - 53 umol/L   URINALYSIS, MACROSCOPIC AND MICROSCOPIC W/CULTURE REFLEX [PRN ONLY]    Specimen: Urine, Clean Catch    Narrative    The following orders were created for panel order URINALYSIS, MACROSCOPIC AND MICROSCOPIC W/CULTURE REFLEX [PRN ONLY].  Procedure                               Abnormality         Status                     ---------                               -----------         ------                     URINALYSIS, MACROSCOPIC[[id number]]                                                     URINALYSIS, MICROSCOPIC[[id number]]                                                       Please view results for these tests on the individual orders.        IMAGING STUDIES:    CT ABDOMEN PELVIS W IV CONTRAST   Final Result   Diffuse wall thickening of the colon most consistent with colitis. This may BE infectious or inflammatory nature. A follow-up colonoscopy is recommended for better evaluation of the colon.      Other findings as above.          One or more dose reduction techniques were used (e.g., Automated exposure control, adjustment of the mA and/or kV according to patient size, use of iterative reconstruction technique).         Radiologist location ID: [id number]             Tommye Standard, DO    This note was partially generated using MModal Fluency Direct system, and there may be some incorrect words, spellings, and punctuation that were not noted in checking the note before saving

## 2023-04-01 NOTE — Care Plan (Incomplete)
Assessment and plan    COPD  Smoker  GERD  Ulcerative coilitis  HTN  BPH    Ulcerative coliti  Ct abd pelvis - Diffuse wall thickening of the colon most consistent with colitis. This may BE infectious or inflammatory nature. A follow-up colonoscopy is recommended for better evaluation of the colon.   Hypokalemia - 2.4   Hypomag 1.6  Alkalosis   B/l LE edema  Shortness of breath  Abdominal pain  Leukocytosis 10.5 > 18

## 2023-04-01 NOTE — ED Notes (Signed)
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    Prior treatment history?: No    Currently enrolled in substance use program?: No    Within the last 30 days, what substances has the patient used?: Alcohol  Patient's age at first substance use?: 15-20  Drug route of administration: Oral    Has the patient ever had sustained abstinence?: No              Family, Social, Home & Safety History  Marital Status: Married            Need to improve relationships with family?: No    Social network: Immediate family    Current living situation: Independent  Any help needed with the following?: None  Contact phone number for the patient: [phone]  Emergency contact name and phone number: Theophilus Walz, wife, [phone]    Has the patient had any legal issues within the past 30 days?: None         Employment  Current employment status: Other    Needs vocational training?: No  Needs assistance with job search?: No    Engagement  Readiness ruler: 1  Summary of assessment priority areas: Substance abuse treatment    Brief Intervention  Discussed plan to reduce/quit substance use?: Yes (NIAAA safe alcohol guidelines, harm reduction and long term  use shared with patient.)  Discussed willingness to enter treatment?: Yes (Does not feel that alcohol consumption is a problem at this time.)  Indicated patient's stage of change:: 1 - Precontemplation    Patient seen by Peer  Recovery Coach and is a candidate for buprenorphine administration in the ED. Patient needs assessment for bup treatment.: No (N/A)    Plan  Was the patient referred to treatment?: No    Was patient referred to physician for Buprenorphine Assessment in the ED?: No    Did patient receive Narcan in the ED?: No    Plan: Additional Comments: Safe alcohol guidelines shared with patient for a male his age, patient falls under those guidelines, 12 a week verses 14. Patient does not feel that alcohol interferes with his life. Harm reduction and long term use shared with patient.    Follow-up           Need for additional follow-up?: No       Harle Battiest, Peer Recovery Coach 04/01/2023 12:26

## 2023-04-01 NOTE — Consults (Signed)
[facility]  Gastroenterology/ Hepatology Consult Note    [name], [name], 47 y.o. male  Encounter Start Date:  03/31/2023  Inpatient Admission Date: 04/01/2023   Date of Birth:  May 10, 1976    Date of Service: 04/01/23     Referring physician:  No ref. provider found    Chief Complaint: UC flare    [name] is a 47 y.o., White male who presents with abnormal labs from PCP (potassium 2.1). 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 He does does follow with [place]. Last office office visit was in June and he colonoscopy in July showed mild colitis up to 40 cm.  He was continued on Apriso 0.375 gm 4 po daily and Entorcort 3 mg 3 PO daily was started. He was given a short course of steroids. 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 He does have frequent admissions.  He was scheduled to [place] on September 25th but rescheduled to 10/14 due to back pain. He has been referred to [facility] for substance abuse, likely non compliant.     He states he drinks alcohol to help with his pain and to keep his blood pressure down.       REVIEW OF SYSTEMS:    Review of Systems was completed with pertinent ROS as addressed in HPI.            Medications Prior to Admission       Prescriptions    albuterol sulfate (PROVENTIL OR VENTOLIN OR PROAIR) 90 mcg/actuation Inhalation oral inhaler    Take 1-2 Puffs by inhalation Every 6 hours as needed    budesonide-formoteroL (SYMBICORT) 160-4.5 mcg/actuation Inhalation oral inhaler    Take 2 Puffs by inhalation Twice daily    cholestyramine-sucrose (QUESTRAN) 4 gram Oral Powder in Packet    Take 1 Packet by mouth Once a day    Mesalamine (APRISO) 0.375 gram Oral Capsule, Sust. Release 24 hr    Take 4 Capsules (1.5 g total) by mouth Once a day    omeprazole (PRILOSEC) 40 mg Oral Capsule, Delayed Release(E.C.)    Take 1 Capsule (40 mg total) by mouth Once a day    spironolactone  (ALDACTONE) 50 mg Oral Tablet    Take 1 Tablet (50 mg total) by mouth Once a day             Allergies:    Allergies   Allergen Reactions    Noctec [Chloral Hydrate] Anaphylaxis    Penicillins Anaphylaxis    Theophylline Shortness of Breath    Flexeril [Cyclobenzaprine] Mental Status Effect     Homicidal ideation    Prednisone Myalgia    Steroids [Steroid] NO Steroids unless approved by Attending Physician         ED medications:   Medications Administered in the ED   potassium chloride 20 mEq in SW 100 mL premix infusion (0 mEq Intravenous Stopped 04/01/23 0040)   potassium chloride 20 mEq in SW 100 mL premix infusion (20 mEq Intravenous New Bag/New Syringe 04/01/23 0041)   morphine 4 mg/mL injection (4 mg Intravenous Given 03/31/23 2309)   ondansetron (ZOFRAN) 2 mg/mL injection (4 mg Intravenous Given 03/31/23 2309)   iohexol (OMNIPAQUE 350) infusion (75 mL Intravenous Given 04/01/23 0108)         Home Meds:      Prior to Admission medications    Medication Sig Start Date End Date Taking? Authorizing Provider   albuterol sulfate (  PROVENTIL OR VENTOLIN OR PROAIR) 90 mcg/actuation Inhalation oral inhaler Take 1-2 Puffs by inhalation Every 6 hours as needed    Provider, Historical   budesonide-formoteroL (SYMBICORT) 160-4.5 mcg/actuation Inhalation oral inhaler Take 2 Puffs by inhalation Twice daily   Yes Provider, Historical   cholestyramine-sucrose (QUESTRAN) 4 gram Oral Powder in Packet Take 1 Packet by mouth Once a day 11/08/21  Yes Provider, Historical   Mesalamine (APRISO) 0.375 gram Oral Capsule, Sust. Release 24 hr Take 4 Capsules (1.5 g total) by mouth Once a day   Yes Provider, Historical   omeprazole (PRILOSEC) 40 mg Oral Capsule, Delayed Release(E.C.) Take 1 Capsule (40 mg total) by mouth Once a day   Yes Provider, Historical   spironolactone (ALDACTONE) 50 mg Oral Tablet Take 1 Tablet (50 mg total) by mouth Once a day 10/16/21  Yes Provider, Historical   BROMPHENIR-DIPHENHYD-PHENYLEPH ORAL Take by mouth   04/01/23  Provider, Historical   famotidine (PEPCID) 40 mg Oral Tablet Take 1 Tablet (40 mg total) by mouth Twice daily  04/01/23  Provider, Historical   Pseudoephedrine-Guaifenesin (MUCINEX [name]) 60-600 mg Oral Tablet Sustained Release 12 hr Take 1 Tablet by mouth Every 12 hours  04/01/23  Provider, Historical   tamsulosin (FLOMAX) 0.4 mg Oral Capsule Take 1 Capsule (0.4 mg total) by mouth Every evening after dinner for 30 days 11/01/22 04/01/23  George Priest D, PA-C          Inpatient Meds:      budesonide-formoterol (SYMBICORT) 160 mcg-4.5 mcg per inhalation oral inhaler - "Respiratory to administer", 2 Puff, Inhalation, 2x/day  cholestyramine-sucrose (QUESTRAN) 4 gram packet, 1 Packet, Oral, Daily  enoxaparin PF (LOVENOX) 40 mg/0.4 mL SubQ injection, 40 mg, Subcutaneous, Q24H  famotidine (PEPCID) tablet, 40 mg, Oral, 2x/day  ipratropium-albuterol 0.5 mg-3 mg(2.5 mg base)/3 mL Solution for Nebulization, 3 mL, Nebulization, Q4H PRN  levoFLOXacin (LEVAQUIN) 750 mg in D5W 150 mL premix IVPB, 750 mg, Intravenous, Q24H  magnesium sulfate 1 G in D5W 100 mL premix IVPB, 1 g, Intravenous, Once  magnesium sulfate 1 G in D5W 100 mL premix IVPB, 1 g, Intravenous, Once  magnesium sulfate 1 G in D5W 100 mL premix IVPB, 1 g, Intravenous, Once  mesalamine (DELZICOL) capsule with delayed release tablets inside, 800 mg, Oral, 2x/day  metroNIDAZOLE (FLAGYL) 500 mg in NS 100 mL premix IVPB, 500 mg, Intravenous, Q12H  nicotine (NICODERM CQ) transdermal patch (mg/24 hr), 21 mg, Transdermal, Q24H  NS premix infusion, , Intravenous, Continuous  ondansetron (ZOFRAN) 2 mg/mL injection, 4 mg, Intravenous, Q8H PRN  pantoprazole (PROTONIX) delayed release tablet, 40 mg, Oral, Daily  potassium chloride (K-DUR) extended release tablet, 40 mEq, Oral, 3x/day-Meals  potassium chloride 20 mEq in SW 100 mL premix infusion, 20 mEq, Intravenous, Q1H  spironolactone (ALDACTONE) tablet, 50 mg, Oral, Daily  tamsulosin (FLOMAX) capsule, 0.4 mg, Oral,  Daily after Dinner             PMHx:    Past Medical History:   Diagnosis Date    Asthma     Bleeding hemorrhoid     Bleeding ulcer     Cervical herniated disc     COPD (chronic obstructive pulmonary disease) (CMS HCC)     GERD (gastroesophageal reflux disease)     Hx MRSA infection     Hyperlipidemia     Hypertension     Pre-diabetes     Rotator cuff arthropathy, right     Ulcerative colitis (CMS HCC)  PSHx:   Past Surgical History:   Procedure Laterality Date    HX CYST REMOVAL      lower back    HX HERNIA REPAIR      HX TONSILLECTOMY            Family History  Family Medical History:       Problem Relation (Age of Onset)    Anesthesia Complications Father    Arthritis-osteo Mother, Father    Asthma Mother, Father, Brother, Paternal Grandmother, Paternal Grandfather    Blood Clots Father, Paternal Grandmother, Paternal Grandfather    Cancer Mother, Father    Congestive Heart Failure Father, Paternal Grandmother, Paternal Grandfather    Coronary Artery Disease Father, Paternal Grandmother, Paternal Grandfather    Diabetes Mother, Father    Heart Attack Father, Paternal Grandmother, Paternal Grandfather    High Cholesterol Mother, Father    Hypertension (High Blood Pressure) Mother, Father    Stroke Father, Maternal Grandmother, Maternal Grandfather, Paternal Grandmother, Paternal Grandfather           Social History  Social History     Tobacco Use    Smoking status: Every Day     Current packs/day: 1.00     Average packs/day: 1 pack/day for 30.0 years (30.0 ttl pk-yrs)     Types: Cigarettes    Smokeless tobacco: Never   Vaping Use    Vaping status: Never Used   Substance Use Topics    Alcohol use: Yes     Alcohol/week: 8.0 standard drinks of alcohol     Types: 8 Cans of beer per week     Comment: occasional    Drug use: Never             Previous GI Procedures:  * No surgery found *      Vital Signs:  Temp (24hrs) Max:36.9 C (98.4 F)      Systolic (24hrs), Avg:123 , Min:109 , Max:135     Diastolic (24hrs),  Avg:78, Min:68, Max:86    Temp  Avg: 36.8 C (98.2 F)  Min: 36.7 C (98 F)  Max: 36.9 C (98.4 F)  MAP (Non-Invasive)  Avg: 91.9 mmHG  Min: 83 mmHG  Max: 100 mmHG  Pulse  Avg: 96.8  Min: 85  Max: 104  Resp  Avg: 18.2  Min: 12  Max: 27  SpO2  Avg: 93.8 %  Min: 89 %  Max: 99 %           PHYSICAL EXAM:   Temperature: 36.9 C (98.4 F)  Heart Rate: 97  BP (Non-Invasive): 109/70  Respiratory Rate: 18  SpO2: 92 %  Vital signs reviewed    General  No acute distress  Lungs clear bilaterally  Card RRR,   Abdomen  BS active, no tenderness, no guarding, no distension,   Ext  no edema       RELEVANT LABS:  Labs:    Results for orders placed or performed during the hospital encounter of 03/31/23 (from the past 24 hour(s))   COMPREHENSIVE METABOLIC PANEL, NON-FASTING   Result Value Ref Range    SODIUM 135 (L) 136 - 145 mmol/L    POTASSIUM 2.1 (LL) 3.5 - 5.1 mmol/L    CHLORIDE 91 (L) 98 - 107 mmol/L    CO2 TOTAL 28 21 - 31 mmol/L    ANION GAP 16 (H) 4 - 13 mmol/L    BUN 3 (L) 7 - 25 mg/dL    CREATININE 5.62 (L) 0.60 -  1.30 mg/dL    BUN/CREA RATIO 6 6 - 22    ESTIMATED GFR 127 >59 mL/min/1.53m^2    ALBUMIN 3.8 3.5 - 5.7 g/dL    CALCIUM 8.9 8.6 - 83.6 mg/dL    GLUCOSE 629 (H) 74 - 109 mg/dL    ALKALINE PHOSPHATASE 133 (H) 34 - 104 U/L    ALT (SGPT) 25 7 - 52 U/L    AST (SGOT) 72 (H) 13 - 39 U/L    BILIRUBIN TOTAL 1.3 (H) 0.3 - 1.0 mg/dL    PROTEIN TOTAL 7.0 6.4 - 8.9 g/dL    ALBUMIN/GLOBULIN RATIO 1.2 0.8 - 1.4    OSMOLALITY, CALCULATED 268 (L) 270 - 290 mOsm/kg    CALCIUM, CORRECTED 9.1 8.9 - 10.8 mg/dL    GLOBULIN 3.2 2.9 - 5.4   CBC WITH DIFF   Result Value Ref Range    WBC 18.4 (H) 3.6 - 10.2 x10^3/uL    RBC 3.66 (L) 4.06 - 5.63 x10^6/uL    HGB 12.8 12.5 - 16.3 g/dL    HCT 47.6 54.6 - 50.3 %    MCV 104.7 (H) 73.0 - 96.2 fL    MCH 34.9 (H) 23.8 - 33.4 pg    MCHC 33.3 32.5 - 36.3 g/dL    RDW 54.6 (H) 56.8 - 16.2 %    PLATELETS 363 140 - 440 x10^3/uL    MPV 7.9 7.4 - 11.4 fL    NEUTROPHIL % 66 43 - 77 %    LYMPHOCYTE % 22 16 -  44 %    MONOCYTE % 10 5 - 13 %    EOSINOPHIL % 1 1 - 8 %    BASOPHIL % 1 0 - 1 %    NEUTROPHIL # 12.10 (H) 1.85 - 7.80 x10^3/uL    LYMPHOCYTE # 4.10 (H) 1.00 - 3.00 x10^3/uL    MONOCYTE # 1.80 (H) 0.30 - 1.00 x10^3/uL    EOSINOPHIL # 0.20 0.00 - 0.50 x10^3/uL    BASOPHIL # 0.20 (H) 0.00 - 0.10 x10^3/uL   SCAN DIFFERENTIAL   Result Value Ref Range    ANISOCYTOSIS 1+ (10-25%)     POIKILOCYTOSIS 1+ (10-25%)     MACROCYTOSIS 1+ (10-25%)     TARGET CELLS 1+ (10-25%)     STOMATOCYTES 1+ (10-25%)     PLATELET CLUMPS Present    BLUE TOP TUBE   Result Value Ref Range    RAINBOW/EXTRA TUBE AUTO RESULT Yes    GOLD TOP TUBE   Result Value Ref Range    RAINBOW/EXTRA TUBE AUTO RESULT Yes    GRAY TOP TUBE   Result Value Ref Range    RAINBOW/EXTRA TUBE AUTO RESULT Yes    AMMONIA   Result Value Ref Range    AMMONIA 30 16 - 53 umol/L   VITAMIN B12   Result Value Ref Range    VITAMIN B 12 392 180 - 914 pg/mL   PT/INR   Result Value Ref Range    PROTHROMBIN TIME 13.9 (H) 9.8 - 12.7 seconds    INR 1.19 (H) 0.84 - 1.10   PTT (PARTIAL THROMBOPLASTIN TIME)   Result Value Ref Range    APTT 29.0 25.0 - 38.0 seconds   BASIC METABOLIC PANEL, NON-FASTING   Result Value Ref Range    SODIUM 138 136 - 145 mmol/L    POTASSIUM 2.4 (LL) 3.5 - 5.1 mmol/L    CHLORIDE 95 (L) 98 - 107 mmol/L    CO2 TOTAL 34 (H) 21 -  31 mmol/L    ANION GAP 9 4 - 13 mmol/L    CALCIUM 8.2 (L) 8.6 - 10.3 mg/dL    GLUCOSE 161 (H) 74 - 109 mg/dL    BUN 3 (L) 7 - 25 mg/dL    CREATININE 0.96 (L) 0.60 - 1.30 mg/dL    BUN/CREA RATIO 5 (L) 6 - 22    ESTIMATED GFR 125 >59 mL/min/1.30m^2    OSMOLALITY, CALCULATED 274 270 - 290 mOsm/kg   MAGNESIUM   Result Value Ref Range    MAGNESIUM 1.6 (L) 1.9 - 2.7 mg/dL   TROPONIN-I   Result Value Ref Range    TROPONIN I 8 <20 ng/L   URINALYSIS, MACROSCOPIC   Result Value Ref Range    COLOR Yellow Colorless, Light Yellow, Yellow    APPEARANCE Clear Clear    SPECIFIC GRAVITY 1.020 1.002 - 1.030    PH 7.0 5.0 - 9.0    LEUKOCYTES Negative Negative,  100  WBCs/uL    NITRITE Negative Negative    PROTEIN Negative Negative, 10 , 20  mg/dL    GLUCOSE Negative Negative, 30  mg/dL    KETONES Negative Negative, Trace mg/dL    BILIRUBIN Negative Negative, 0.5 mg/dL    BLOOD Negative Negative, 0.03 mg/dL    UROBILINOGEN 6 (A) Normal mg/dL   URINALYSIS, MICROSCOPIC   Result Value Ref Range    RBCS      WBCS 1 <6 /hpf    SQUAMOUS EPITHELIAL <1 <28 /hpf    NON-SQUAMOUS EPITHELIAL CELLS URINE <1 <=1 /hpf   LAVENDER TOP TUBE   Result Value Ref Range    RAINBOW/EXTRA TUBE AUTO RESULT Yes           No results found for: "CARCIEMBAG", "MQAD24", "CAAGGICA199", "CA199"   CBC  Diff   Lab Results   Component Value Date/Time    WBC 18.4 (H) 03/31/2023 09:52 PM    HGB 12.8 03/31/2023 09:52 PM    HCT 38.3 03/31/2023 09:52 PM    PLTCNT 363 03/31/2023 09:52 PM    RBC 3.66 (L) 03/31/2023 09:52 PM    MCV 104.7 (H) 03/31/2023 09:52 PM    MCHC 33.3 03/31/2023 09:52 PM    MCH 34.9 (H) 03/31/2023 09:52 PM    RDW 24.3 (H) 03/31/2023 09:52 PM    MPV 7.9 03/31/2023 09:52 PM    Lab Results   Component Value Date/Time    PMNS 66 03/31/2023 09:52 PM    LYMPHOCYTES 22 03/31/2023 09:52 PM    EOSINOPHIL 1 03/31/2023 09:52 PM    MONOCYTES 10 03/31/2023 09:52 PM    BASOPHILS 1 03/31/2023 09:52 PM    BASOPHILS 0.20 (H) 03/31/2023 09:52 PM    PMNABS 12.10 (H) 03/31/2023 09:52 PM    LYMPHSABS 4.10 (H) 03/31/2023 09:52 PM    EOSABS 0.20 03/31/2023 09:52 PM    MONOSABS 1.80 (H) 03/31/2023 09:52 PM           No results found for: "CARCIEMBAG" @LASTIFOB @    Imaging Studies:  No results found for this or any previous visit (from the past 045409811 hour(s)).  No results found for this or any previous visit (from the past 914782956 hour(s)).  Recent Results (from the past 213086578 hour(s))   CT ABDOMEN PELVIS W IV CONTRAST    Collection Time: 04/01/23  1:08 AM    Narrative    SR. Almond ANDREW [facility] SR.    RADIOLOGIST: Carma Lair    CT ABDOMEN PELVIS W IV CONTRAST  performed on 04/01/2023 1:08  AM    CLINICAL HISTORY: diffuse abdominal pain and swelling.  diffuse abdominal pain and swelling, hx. hernia repair    TECHNIQUE:  Abdomen and pelvis CT with intravenous contrast.  IV CONTRAST: 75 ml's of Omnipaque 350    COMPARISON:  12/13/2022 and 10/31/2022    FINDINGS:  Lung bases: Clear    Liver:   Hepatic steatosis. The liver is enlarged.    Gallbladder:   There is probably some layering sludge or stones.    Spleen:   Unremarkable.    Pancreas:   Unremarkable.    Adrenals:   Unremarkable.    Kidneys:   There is a cyst in the right kidney. No hydronephrosis.    Bladder:  The bladder is distended. There is some circumferential bladder wall thickening.  Prostate:  Unremarkable.    Bowel:   There is diffuse wall thickening of the colon most consistent with colitis. This may BE infectious or inflammatory in nature. There is no large or small bowel dilatation.    Appendix:  Normal.    Lymph nodes:  No suspicious lymph node enlargement.    Vasculature:   Mild diffuse atherosclerotic calcifications are noted.     Peritoneum / Retroperitoneum: No ascites.  No free air.    Bones:   Unremarkable.          Impression    Diffuse wall thickening of the colon most consistent with colitis. This may BE infectious or inflammatory nature. A follow-up colonoscopy is recommended for better evaluation of the colon.    Other findings as above.       One or more dose reduction techniques were used (e.g., Automated exposure control, adjustment of the mA and/or kV according to patient size, use of iterative reconstruction technique).      Radiologist location ID: [id number]     CT ABDOMEN PELVIS WO IV CONTRAST    Collection Time: 08/22/22  5:44 AM    Narrative    SR. Delwyn ANDREW [facility] SR.    RADIOLOGIST: Mallie Mussel    CT ABDOMEN PELVIS WO IV CONTRAST performed on 08/22/2022 5:44 AM    CLINICAL HISTORY: Elevated liver enzymes..  Elevated liver enzymes., sx hx of umbilical hernia repair    TECHNIQUE:  Abdomen and pelvis CT  without intravenous contrast.    COMPARISON:  05/31/2022  # of known CTs in the past 12 months:  2   # of known Cardiac Nuclear Medicine Studies in the past 12 months:  0    FINDINGS:  Noncontrast technique limits evaluation of the abdominal and pelvic viscera.    Lung bases: Mild dependent atelectasis    Liver:   Diffuse fatty infiltration.    Gallbladder:   Unremarkable.    Spleen:   Unremarkable.    Pancreas:   Unremarkable.    Adrenals:   Unremarkable.    Kidneys:   Unremarkable.    Bladder:  Mild circumferential wall thickening    Prostate:  Unremarkable.    Bowel:   Colonic diverticulosis without diverticulitis.    Appendix:  Normal.    Lymph nodes:  No suspicious lymph node enlargement.    Vasculature:   Mild diffuse atherosclerotic calcifications are noted.     Peritoneum / Retroperitoneum: No ascites.  No free air.    Bones:   Degenerative changes of the spine. 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  Small bilateral fat-containing inguinal hernias.  Impression    NO ACUTE FINDINGS AT THE ABDOMEN OR PELVIS ON NONCONTRAST CT.    HEPATIC STEATOSIS.       One or more dose reduction techniques were used (e.g., Automated exposure control, adjustment of the mA and/or kV according to patient size, use of iterative reconstruction technique).      Radiologist location ID: [id number]       No results found for this or any previous visit (from the past 811914782 hour(s)).  No results found for this or any previous visit (from the past 956213086 hour(s)).  No results found for this or any previous visit (from the past 578469629 hour(s)).    Assessment/Plan and Recommendations:    Diarrhea  Ulcerative colitis    Continue current management. Stool studies have been ordered but not collected, as he has not a BM since admission. He will need to resume colitis medication upon discharge. Limit the use of systemic steroids. Will need follow up in our office on Monday  if he is discharged tomorrow. Discussed with Dr.K.Patel and treatment by him.        Plan:      Ulyess Blossom, NP-C

## 2023-04-01 NOTE — ED Nurses Note (Signed)
Report given to Misty Stanley, RN on 3-W at this time.

## 2023-04-01 NOTE — Progress Notes (Signed)
[facility]               IP PROGRESS NOTE      [name], [name]  Date of Admission:  03/31/2023  Date of Birth:  05/03/1976  Date of Service:  04/01/2023    Hospital Day:  LOS: 0 days     Subjective:   [name] is a 47 y/o male who is being seen in f/ u for hypokalemia, diarrhea with h/o of UC, difficulty urinating and abdominal and leg swelling with hepatic steatosis. Patient seen and examined at bedside with hospitalist.  Patient with multiple complaints including persistent N/V/D, swelling in his abdomen, legs, and barretts esophagitis.  Patient has UC and barretts and has been seen by GI .        Vital Signs:  Temp (24hrs) Max:36.9 C (98.4 F)      Temperature: 36.7 C (98.1 F)  BP (Non-Invasive): 119/76  MAP (Non-Invasive): 87 mmHG  Heart Rate: 90  Respiratory Rate: 18  SpO2: 92 %    Current Medications:  budesonide-formoterol (SYMBICORT) 160 mcg-4.5 mcg per inhalation oral inhaler - "Respiratory to administer", 2 Puff, Inhalation, 2x/day  cholestyramine-sucrose (QUESTRAN) 4 gram packet, 1 Packet, Oral, Daily  enoxaparin PF (LOVENOX) 40 mg/0.4 mL SubQ injection, 40 mg, Subcutaneous, Q24H  famotidine (PEPCID) tablet, 40 mg, Oral, 2x/day  ipratropium-albuterol 0.5 mg-3 mg(2.5 mg base)/3 mL Solution for Nebulization, 3 mL, Nebulization, Q4H PRN  levoFLOXacin (LEVAQUIN) 750 mg in D5W 150 mL premix IVPB, 750 mg, Intravenous, Q24H  magnesium sulfate 1 G in D5W 100 mL premix IVPB, 1 g, Intravenous, Once  mesalamine (DELZICOL) capsule with delayed release tablets inside, 800 mg, Oral, 2x/day  metroNIDAZOLE (FLAGYL) 500 mg in NS 100 mL premix IVPB, 500 mg, Intravenous, Q12H  nicotine (NICODERM CQ) transdermal patch (mg/24 hr), 21 mg, Transdermal, Q24H  NS premix infusion, , Intravenous, Continuous  ondansetron (ZOFRAN) 2 mg/mL injection, 4 mg, Intravenous, Q8H PRN  pantoprazole (PROTONIX) delayed release tablet, 40 mg, Oral, Daily  potassium chloride (K-DUR) extended release tablet, 40  mEq, Oral, 3x/day-Meals  potassium chloride 20 mEq in SW 100 mL premix infusion, 20 mEq, Intravenous, Q1H  spironolactone (ALDACTONE) tablet, 50 mg, Oral, Daily  tamsulosin (FLOMAX) capsule, 0.4 mg, Oral, Daily after Dinner        Current Orders:  Active Orders   Microbiology    C. DIFFICILE PCR     Frequency: ONE TIME     Number of Occurrences: 1 Occurrences    GI PANEL BY BIOFIRE FILM ARRAY     Frequency: ONE TIME     Number of Occurrences: 1 Occurrences   Lab    CBC     Frequency: ONE TIME     Number of Occurrences: 1 Occurrences    CBC     Frequency: ONE TIME     Number of Occurrences: 1 Occurrences    CBC     Frequency: ONE TIME     Number of Occurrences: 1 Occurrences    COMPREHENSIVE METABOLIC PANEL, NON-FASTING     Frequency: ONE TIME     Number of Occurrences: 1 Occurrences    COMPREHENSIVE METABOLIC PANEL, NON-FASTING     Frequency: ONE TIME     Number of Occurrences: 1 Occurrences    COMPREHENSIVE METABOLIC PANEL, NON-FASTING     Frequency: ONE TIME     Number of Occurrences: 1 Occurrences    MAGNESIUM     Frequency: ONE TIME  Number of Occurrences: 1 Occurrences    MAGNESIUM     Frequency: ONE TIME     Number of Occurrences: 1 Occurrences    MAGNESIUM     Frequency: ONE TIME     Number of Occurrences: 1 Occurrences   Diet    DIET BLAND Do you want to initiate MNT Protocol? Yes     Frequency: All Meals     Number of Occurrences: 1 Occurrences   Nursing    ACTIVITY     Frequency: UNTIL DISCONTINUED     Number of Occurrences: Until Specified    MEASURE POST VOID RESIDUAL     Frequency: Q VOID     Number of Occurrences: Until Specified    Notify MD Vital Signs     Frequency: PRN     Number of Occurrences: Until Specified    PT IS HIGH RISK FOR VENOUS THROMBOEMBOLISM     Frequency: CONTINUOUS     Number of Occurrences: Until Specified    PULSE OXIMETRY Q4H     Frequency: Q4H     Number of Occurrences: Until Specified    TELEMETRY MONITORING X 48H     Frequency: CONTINUOUS X 48 HRS     Number of  Occurrences: 48 Hours    VITAL SIGNS  Q4H     Frequency: Q4H     Number of Occurrences: Until Specified   Code Status    FULL CODE: ATTEMPT RESUSCITATION / CPR     Frequency: CONTINUOUS     Number of Occurrences: Until Specified     Order Comments: Patient wishes for full ICU level care including advanced airway interventions / mechanical ventilation.     In the event of pulseless cardiac arrest, patient consents to ACLS (advanced cardiac life support) to attempt resuscitation.  IE - Consents to chest compressions, life support including intubation, mechanical ventilation, defibrillation/cardioversion as indicated.         Consult    IP CONSULT TO GASTROENTEROLOGY Requested Provider; Rogers Seeds     Frequency: ONE TIME     Number of Occurrences: 1 Occurrences   Medications    budesonide-formoterol (SYMBICORT) 160 mcg-4.5 mcg per inhalation oral inhaler - "Respiratory to administer"     Frequency: 2x/day     Dose: 2 Puff     Route: Inhalation    cholestyramine-sucrose (QUESTRAN) 4 gram packet     Frequency: Daily     Dose: 1 Packet     Route: Oral    enoxaparin PF (LOVENOX) 40 mg/0.4 mL SubQ injection     Frequency: Q24H     Dose: 40 mg     Route: Subcutaneous    famotidine (PEPCID) tablet     Frequency: 2x/day     Dose: 40 mg     Route: Oral    ipratropium-albuterol 0.5 mg-3 mg(2.5 mg base)/3 mL Solution for Nebulization     Frequency: Q4H PRN     Dose: 3 mL     Route: Nebulization    levoFLOXacin (LEVAQUIN) 750 mg in D5W 150 mL premix IVPB     Frequency: Q24H     Dose: 750 mg     Route: Intravenous    magnesium sulfate 1 G in D5W 100 mL premix IVPB     Frequency: Once     Dose: 1 g     Route: Intravenous    mesalamine (DELZICOL) capsule with delayed release tablets inside     Frequency: 2x/day     Dose:  800 mg     Route: Oral    metroNIDAZOLE (FLAGYL) 500 mg in NS 100 mL premix IVPB     Frequency: Q12H     Dose: 500 mg     Route: Intravenous    nicotine (NICODERM CQ) transdermal patch (mg/24 hr)     Frequency:  Q24H     Dose: 21 mg     Route: Transdermal    NS premix infusion     Frequency: Continuous     Route: Intravenous    ondansetron (ZOFRAN) 2 mg/mL injection     Frequency: Q8H PRN     Dose: 4 mg     Route: Intravenous    pantoprazole (PROTONIX) delayed release tablet     Frequency: Daily     Dose: 40 mg     Route: Oral    potassium chloride (K-DUR) extended release tablet     Frequency: 3x/day-Meals     Dose: 40 mEq     Route: Oral    potassium chloride 20 mEq in SW 100 mL premix infusion     Frequency: Q1H     Dose: 20 mEq     Route: Intravenous    spironolactone (ALDACTONE) tablet     Frequency: Daily     Dose: 50 mg     Route: Oral    tamsulosin (FLOMAX) capsule     Frequency: Daily after Dinner     Dose: 0.4 mg     Route: Oral        Review of Systems:  Focused review of system was completed. Refer to the HPI for ROS details.     Today's Physical Exam:  Physical Exam  Constitutional:       General: He is not in acute distress.     Appearance: Normal appearance. He is not ill-appearing.   HENT:      Head: Normocephalic and atraumatic.   Eyes:      Conjunctiva/sclera: Conjunctivae normal.      Pupils: Pupils are equal, round, and reactive to light.   Cardiovascular:      Rate and Rhythm: Normal rate and regular rhythm.   Pulmonary:      Effort: No respiratory distress.      Breath sounds: No wheezing or rales.   Abdominal:      General: Bowel sounds are normal. There is distension.      Palpations: There is hepatomegaly.   Musculoskeletal:      Right lower leg: Edema present.      Left lower leg: Edema present.   Skin:     General: Skin is warm.      Coloration: Skin is not jaundiced.   Neurological:      General: No focal deficit present.      Mental Status: He is alert and oriented to person, place, and time.   Psychiatric:         Mood and Affect: Mood normal.         Thought Content: Thought content normal.         Judgment: Judgment normal.        I/O:  I/O last 24 hours:    Intake/Output Summary (Last 24  hours) at 04/01/2023 1320  Last data filed at 04/01/2023 1157  Gross per 24 hour   Intake 951.67 ml   Output 300 ml   Net 651.67 ml     I/O current shift:  10/12 0700 - 10/12 1859  In: 651.67  Out: 300 [Urine:300]  Labs:  Reviewed: I have reviewed all lab results.  Lab Results Today:    Results for orders placed or performed during the hospital encounter of 03/31/23 (from the past 24 hour(s))   COMPREHENSIVE METABOLIC PANEL, NON-FASTING   Result Value Ref Range    SODIUM 135 (L) 136 - 145 mmol/L    POTASSIUM 2.1 (LL) 3.5 - 5.1 mmol/L    CHLORIDE 91 (L) 98 - 107 mmol/L    CO2 TOTAL 28 21 - 31 mmol/L    ANION GAP 16 (H) 4 - 13 mmol/L    BUN 3 (L) 7 - 25 mg/dL    CREATININE 6.04 (L) 0.60 - 1.30 mg/dL    BUN/CREA RATIO 6 6 - 22    ESTIMATED GFR 127 >59 mL/min/1.3m^2    ALBUMIN 3.8 3.5 - 5.7 g/dL    CALCIUM 8.9 8.6 - 54.0 mg/dL    GLUCOSE 981 (H) 74 - 109 mg/dL    ALKALINE PHOSPHATASE 133 (H) 34 - 104 U/L    ALT (SGPT) 25 7 - 52 U/L    AST (SGOT) 72 (H) 13 - 39 U/L    BILIRUBIN TOTAL 1.3 (H) 0.3 - 1.0 mg/dL    PROTEIN TOTAL 7.0 6.4 - 8.9 g/dL    ALBUMIN/GLOBULIN RATIO 1.2 0.8 - 1.4    OSMOLALITY, CALCULATED 268 (L) 270 - 290 mOsm/kg    CALCIUM, CORRECTED 9.1 8.9 - 10.8 mg/dL    GLOBULIN 3.2 2.9 - 5.4   CBC WITH DIFF   Result Value Ref Range    WBC 18.4 (H) 3.6 - 10.2 x10^3/uL    RBC 3.66 (L) 4.06 - 5.63 x10^6/uL    HGB 12.8 12.5 - 16.3 g/dL    HCT 19.1 47.8 - 29.5 %    MCV 104.7 (H) 73.0 - 96.2 fL    MCH 34.9 (H) 23.8 - 33.4 pg    MCHC 33.3 32.5 - 36.3 g/dL    RDW 62.1 (H) 30.8 - 16.2 %    PLATELETS 363 140 - 440 x10^3/uL    MPV 7.9 7.4 - 11.4 fL    NEUTROPHIL % 66 43 - 77 %    LYMPHOCYTE % 22 16 - 44 %    MONOCYTE % 10 5 - 13 %    EOSINOPHIL % 1 1 - 8 %    BASOPHIL % 1 0 - 1 %    NEUTROPHIL # 12.10 (H) 1.85 - 7.80 x10^3/uL    LYMPHOCYTE # 4.10 (H) 1.00 - 3.00 x10^3/uL    MONOCYTE # 1.80 (H) 0.30 - 1.00 x10^3/uL    EOSINOPHIL # 0.20 0.00 - 0.50 x10^3/uL    BASOPHIL # 0.20 (H) 0.00 - 0.10 x10^3/uL   SCAN DIFFERENTIAL    Result Value Ref Range    ANISOCYTOSIS 1+ (10-25%)     POIKILOCYTOSIS 1+ (10-25%)     MACROCYTOSIS 1+ (10-25%)     TARGET CELLS 1+ (10-25%)     STOMATOCYTES 1+ (10-25%)     PLATELET CLUMPS Present    BLUE TOP TUBE   Result Value Ref Range    RAINBOW/EXTRA TUBE AUTO RESULT Yes    GOLD TOP TUBE   Result Value Ref Range    RAINBOW/EXTRA TUBE AUTO RESULT Yes    GRAY TOP TUBE   Result Value Ref Range    RAINBOW/EXTRA TUBE AUTO RESULT Yes    AMMONIA   Result Value Ref Range    AMMONIA 30 16 - 53 umol/L  VITAMIN B12   Result Value Ref Range    VITAMIN B 12 392 180 - 914 pg/mL   PT/INR   Result Value Ref Range    PROTHROMBIN TIME 13.9 (H) 9.8 - 12.7 seconds    INR 1.19 (H) 0.84 - 1.10   PTT (PARTIAL THROMBOPLASTIN TIME)   Result Value Ref Range    APTT 29.0 25.0 - 38.0 seconds   BASIC METABOLIC PANEL, NON-FASTING   Result Value Ref Range    SODIUM 138 136 - 145 mmol/L    POTASSIUM 2.4 (LL) 3.5 - 5.1 mmol/L    CHLORIDE 95 (L) 98 - 107 mmol/L    CO2 TOTAL 34 (H) 21 - 31 mmol/L    ANION GAP 9 4 - 13 mmol/L    CALCIUM 8.2 (L) 8.6 - 10.3 mg/dL    GLUCOSE 478 (H) 74 - 109 mg/dL    BUN 3 (L) 7 - 25 mg/dL    CREATININE 2.95 (L) 0.60 - 1.30 mg/dL    BUN/CREA RATIO 5 (L) 6 - 22    ESTIMATED GFR 125 >59 mL/min/1.58m^2    OSMOLALITY, CALCULATED 274 270 - 290 mOsm/kg   MAGNESIUM   Result Value Ref Range    MAGNESIUM 1.6 (L) 1.9 - 2.7 mg/dL   TROPONIN-I   Result Value Ref Range    TROPONIN I 8 <20 ng/L   URINALYSIS, MACROSCOPIC   Result Value Ref Range    COLOR Yellow Colorless, Light Yellow, Yellow    APPEARANCE Clear Clear    SPECIFIC GRAVITY 1.020 1.002 - 1.030    PH 7.0 5.0 - 9.0    LEUKOCYTES Negative Negative, 100  WBCs/uL    NITRITE Negative Negative    PROTEIN Negative Negative, 10 , 20  mg/dL    GLUCOSE Negative Negative, 30  mg/dL    KETONES Negative Negative, Trace mg/dL    BILIRUBIN Negative Negative, 0.5 mg/dL    BLOOD Negative Negative, 0.03 mg/dL    UROBILINOGEN 6 (A) Normal mg/dL   URINALYSIS, MICROSCOPIC   Result  Value Ref Range    RBCS      WBCS 1 <6 /hpf    SQUAMOUS EPITHELIAL <1 <28 /hpf    NON-SQUAMOUS EPITHELIAL CELLS URINE <1 <=1 /hpf   LAVENDER TOP TUBE   Result Value Ref Range    RAINBOW/EXTRA TUBE AUTO RESULT Yes      Micro Results: No results found for any visits on 03/31/23 (from the past 24 hour(s)).  Images:   No results found.   CT ABDOMEN PELVIS W IV CONTRAST   Final Result   Diffuse wall thickening of the colon most consistent with colitis. This may BE infectious or inflammatory nature. A follow-up colonoscopy is recommended for better evaluation of the colon.      Other findings as above.          One or more dose reduction techniques were used (e.g., Automated exposure control, adjustment of the mA and/or kV according to patient size, use of iterative reconstruction technique).         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  -re-start flomax  -Bladder scan  -UA negative for nitrites, leukocytes , 1 wbc      #Ulcerative Colitis  -continue mesalamine  -needs to follow up with GI right after d/c      #Leukocytosis  Appears to be chronic, likely 2/2 UC       #hx of borderline B12  -B12 392     #LE/abdominal edema  -trace pitting edema b/l LE  -Resume home spironolactone     #Hepatic steatosis  -finding on CT  -GI f/u outpatient     #Tobacco use  -prn NRT     #EOTH use  -reports 3-4 drinks daily     #Hx of COPD     #Hx of OSA  -no longer has CPAP due to noncompliance  -further treatment adjustments will be made based off clinical course.  See orders for further details.  -Hospitalist personally evaluated and examined the  patient in conjunction with the MLP and agree with the assessments, treatment plan and disposition of the patient as recorded by the [facility].     DVT/PE Prophylaxis: Enoxaparin    Ruben Reason, PA-C

## 2023-04-02 ENCOUNTER — Inpatient Hospital Stay (HOSPITAL_COMMUNITY): Payer: Medicaid Other

## 2023-04-02 LAB — COMPREHENSIVE METABOLIC PANEL, NON-FASTING
ALBUMIN/GLOBULIN RATIO: 1 (ref 0.8–1.4)
ALBUMIN: 3 g/dL — ABNORMAL LOW (ref 3.5–5.7)
ALKALINE PHOSPHATASE: 106 U/L — ABNORMAL HIGH (ref 34–104)
ALT (SGPT): 19 U/L (ref 7–52)
ANION GAP: 7 mmol/L (ref 4–13)
AST (SGOT): 45 U/L — ABNORMAL HIGH (ref 13–39)
BILIRUBIN TOTAL: 1.1 mg/dL — ABNORMAL HIGH (ref 0.3–1.0)
BUN/CREA RATIO: 6 (ref 6–22)
BUN: 3 mg/dL — ABNORMAL LOW (ref 7–25)
CALCIUM, CORRECTED: 8.6 mg/dL — ABNORMAL LOW (ref 8.9–10.8)
CALCIUM: 7.8 mg/dL — ABNORMAL LOW (ref 8.6–10.3)
CHLORIDE: 100 mmol/L (ref 98–107)
CO2 TOTAL: 31 mmol/L (ref 21–31)
CREATININE: 0.53 mg/dL — ABNORMAL LOW (ref 0.60–1.30)
ESTIMATED GFR: 127 mL/min/{1.73_m2} (ref >59)
GLOBULIN: 2.9 (ref 2.9–5.4)
GLUCOSE: 133 mg/dL — ABNORMAL HIGH (ref 74–109)
OSMOLALITY, CALCULATED: 274 mosm/kg (ref 270–290)
POTASSIUM: 2.9 mmol/L — ABNORMAL LOW (ref 3.5–5.1)
PROTEIN TOTAL: 5.9 g/dL — ABNORMAL LOW (ref 6.4–8.9)
SODIUM: 138 mmol/L (ref 136–145)

## 2023-04-02 LAB — CBC
HCT: 31.6 % — ABNORMAL LOW (ref 36.7–47.1)
HGB: 10.8 g/dL — ABNORMAL LOW (ref 12.5–16.3)
MCH: 35.5 pg — ABNORMAL HIGH (ref 23.8–33.4)
MCHC: 34 g/dL (ref 32.5–36.3)
MCV: 104.2 fL — ABNORMAL HIGH (ref 73.0–96.2)
MPV: 7.1 fL — ABNORMAL LOW (ref 7.4–11.4)
PLATELETS: 359 10*3/uL (ref 140–440)
RBC: 3.03 10*6/uL — ABNORMAL LOW (ref 4.06–5.63)
RDW: 23.9 % — ABNORMAL HIGH (ref 12.1–16.2)
WBC: 11.9 10*3/uL — ABNORMAL HIGH (ref 3.6–10.2)

## 2023-04-02 LAB — MAGNESIUM: MAGNESIUM: 2.1 mg/dL (ref 1.9–2.7)

## 2023-04-02 LAB — SCAN DIFFERENTIAL: PLATELET MORPHOLOGY COMMENT: NORMAL

## 2023-04-02 LAB — GOLD TOP TUBE

## 2023-04-02 LAB — POTASSIUM: POTASSIUM: 3.6 mmol/L (ref 3.5–5.1)

## 2023-04-02 MED ORDER — POTASSIUM CHLORIDE 20 MEQ/100ML IN STERILE WATER INTRAVENOUS PIGGYBACK
20.0000 meq | INJECTION | INTRAVENOUS | Status: AC
Start: 2023-04-02 — End: 2023-04-02
  Administered 2023-04-02: 20 meq via INTRAVENOUS
  Administered 2023-04-02: 0 meq via INTRAVENOUS
  Administered 2023-04-02 (×2): 20 meq via INTRAVENOUS
  Administered 2023-04-02 (×2): 0 meq via INTRAVENOUS
  Filled 2023-04-02 (×3): qty 100

## 2023-04-02 MED ORDER — OXYCODONE 5 MG TABLET
5.0000 mg | ORAL_TABLET | Freq: Three times a day (TID) | ORAL | Status: DC | PRN
Start: 2023-04-02 — End: 2023-04-06
  Administered 2023-04-02 – 2023-04-05 (×5): 5 mg via ORAL
  Filled 2023-04-02 (×5): qty 1

## 2023-04-02 NOTE — Care Plan (Signed)
[facility]  Care Plan Note    Situation: Patient admitted with hypokalemia.     Intervention: Labs and vitals being monitored. K+ replacement administered- see MAR.     Response: Patient states he is feeling better, BMP drawn to repeat electrolyte levels          Problem: Adult Inpatient Plan of Care  Goal: Patient-Specific Goal (Individualized)  Outcome: Ongoing (see interventions/notes)  Flowsheets (Taken 04/02/2023 0900)  Individualized Care Needs: monitor labs, tele  Anxieties, Fears or Concerns: concerned over potassium level and IV K+  Patient-Specific Goals (Include Timeframe): d/c when stable  Plan of Care Reviewed With: patient       Gregary Cromer, RN

## 2023-04-03 DIAGNOSIS — G4733 Obstructive sleep apnea (adult) (pediatric): Secondary | ICD-10-CM

## 2023-04-03 DIAGNOSIS — J4489 Other specified chronic obstructive pulmonary disease (CMS HCC): Secondary | ICD-10-CM

## 2023-04-03 LAB — COMPREHENSIVE METABOLIC PANEL, NON-FASTING
ALBUMIN/GLOBULIN RATIO: 1.3 (ref 0.8–1.4)
ALBUMIN: 3.4 g/dL — ABNORMAL LOW (ref 3.5–5.7)
ALKALINE PHOSPHATASE: 106 U/L — ABNORMAL HIGH (ref 34–104)
ALT (SGPT): 20 U/L (ref 7–52)
ANION GAP: 8 mmol/L (ref 4–13)
AST (SGOT): 53 U/L — ABNORMAL HIGH (ref 13–39)
BILIRUBIN TOTAL: 0.7 mg/dL (ref 0.3–1.0)
BUN/CREA RATIO: 4 — ABNORMAL LOW (ref 6–22)
BUN: 2 mg/dL — ABNORMAL LOW (ref 7–25)
CALCIUM, CORRECTED: 8.5 mg/dL — ABNORMAL LOW (ref 8.9–10.8)
CALCIUM: 8 mg/dL — ABNORMAL LOW (ref 8.6–10.3)
CHLORIDE: 102 mmol/L (ref 98–107)
CO2 TOTAL: 27 mmol/L (ref 21–31)
CREATININE: 0.53 mg/dL — ABNORMAL LOW (ref 0.60–1.30)
ESTIMATED GFR: 127 mL/min/{1.73_m2} (ref >59)
GLOBULIN: 2.7 — ABNORMAL LOW (ref 2.9–5.4)
GLUCOSE: 125 mg/dL — ABNORMAL HIGH (ref 74–109)
OSMOLALITY, CALCULATED: 272 mosm/kg (ref 270–290)
POTASSIUM: 3.3 mmol/L — ABNORMAL LOW (ref 3.5–5.1)
PROTEIN TOTAL: 6.1 g/dL — ABNORMAL LOW (ref 6.4–8.9)
SODIUM: 137 mmol/L (ref 136–145)

## 2023-04-03 LAB — LACTIC ACID - FIRST REFLEX: LACTIC ACID: 2.9 mmol/L — ABNORMAL HIGH (ref 0.5–2.2)

## 2023-04-03 LAB — CBC
HCT: 33.4 % — ABNORMAL LOW (ref 36.7–47.1)
HGB: 11.3 g/dL — ABNORMAL LOW (ref 12.5–16.3)
MCH: 35.6 pg — ABNORMAL HIGH (ref 23.8–33.4)
MCHC: 33.7 g/dL (ref 32.5–36.3)
MCV: 105.5 fL — ABNORMAL HIGH (ref 73.0–96.2)
MPV: 7 fL — ABNORMAL LOW (ref 7.4–11.4)
PLATELETS: 373 10*3/uL (ref 140–440)
RBC: 3.16 10*6/uL — ABNORMAL LOW (ref 4.06–5.63)
RDW: 23.9 % — ABNORMAL HIGH (ref 12.1–16.2)
WBC: 13.5 10*3/uL — ABNORMAL HIGH (ref 3.6–10.2)

## 2023-04-03 LAB — LACTIC ACID LEVEL W/ REFLEX FOR LEVEL >2.0: LACTIC ACID: 2.1 mmol/L (ref 0.5–2.2)

## 2023-04-03 LAB — MAGNESIUM: MAGNESIUM: 1.9 mg/dL (ref 1.9–2.7)

## 2023-04-03 LAB — SCAN DIFFERENTIAL: PLATELET MORPHOLOGY COMMENT: NORMAL

## 2023-04-03 LAB — LACTIC ACID - SECOND REFLEX: LACTIC ACID: 1.8 mmol/L (ref 0.5–2.2)

## 2023-04-03 LAB — LIPASE: LIPASE: 8 U/L — ABNORMAL LOW (ref 11–82)

## 2023-04-03 MED ORDER — POTASSIUM CHLORIDE ER 20 MEQ TABLET,EXTENDED RELEASE(PART/CRYST)
40.0000 meq | ORAL_TABLET | Freq: Three times a day (TID) | ORAL | Status: DC
Start: 2023-04-03 — End: 2023-04-06
  Administered 2023-04-03 – 2023-04-06 (×10): 40 meq via ORAL
  Filled 2023-04-03 (×10): qty 2

## 2023-04-03 MED ORDER — MAGNESIUM SULFATE 1 GRAM/100 ML IN DEXTROSE 5 % INTRAVENOUS PIGGYBACK
1.0000 g | INJECTION | Freq: Once | INTRAVENOUS | Status: DC
Start: 2023-04-03 — End: 2023-04-06
  Administered 2023-04-03: 0 g via INTRAVENOUS
  Filled 2023-04-03: qty 100

## 2023-04-03 MED ORDER — POTASSIUM CHLORIDE ER 20 MEQ TABLET,EXTENDED RELEASE(PART/CRYST)
40.0000 meq | ORAL_TABLET | Freq: Two times a day (BID) | ORAL | Status: DC
Start: 2023-04-03 — End: 2023-04-03

## 2023-04-03 NOTE — Consults (Signed)
[facility]  Palliative Care Nurse Practitioner  Consult Note    [name], [name], 47 y.o. [name]  Date of Birth:  January 30, 1976  MRN: [id number]  Admit Date: 03/31/2023   Attending: Hospitalist  Mariah Milling, DO   Reason for Consult: communication with patient/family   Requesting provider: Gregary Cromer, RN    Chief Complaint:  hypokalemia    HPI:  [name] is a 47 y.o. [name] who presented to the ED with abnormal labs from PCP's office, K+ 2.1.  Patient had went to PCP due to difficulty urinating, labs were drawn, K+ was low and was told to come to the ED.  He reported to staff this is a recurrent issue for him, when he takes potassium it gets high and then is taken off.  He has UC and reports chronic diarrhea (5-6x/d).  He reports difficulty emptying his bladder and pain when trying to urinate.  He reports bilateral lower extremity edema, abdominal edema, abdominal pain, chest pain, shortness of breath, back pain.  CT abd/pelvis diffuse wall thickening of the colon consistent with colitis, hepatic steatosis, liver is enlarged.  Na 135, K+2.1, alk phosph 133, ast 72, total bili 1.3, wbc 18.4, mag 1.6.  Patient admitted with hypokalemia, BPH, UC, leukocytosis, LE/abdominal edema, hepatic steatosis, tobacco use, ETOH use, hx COPD, hx osa no longer on CPAP due to noncompliance.  GI was consulted recommended resuming colitis medication upon discharge, limit use of systemic steroids, stool studies have been ordered.  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  K+ was replaced and is 3.3 today, he was restarted on Flomax for BPH, mag IV x1 was ordered, is on flagyl IV, mesalamine, questran, Levaquin IV, aldactone, ondansetron IV prn, oxycodone prn pain.      Subjective:  Patient lying in bed.  He reports stomach being distended all the  time.    Review of Systems:  ROS: Other than ROS in the HPI, all other systems were negative.    Past Medical History:   Diagnosis Date    Asthma     Bleeding hemorrhoid     Bleeding ulcer     Cervical herniated disc     COPD (chronic obstructive pulmonary disease) (CMS HCC)     GERD (gastroesophageal reflux disease)     Hx MRSA infection     Hyperlipidemia     Hypertension     Pre-diabetes     Rotator cuff arthropathy, right     Ulcerative colitis (CMS HCC)          Past Surgical History:   Procedure Laterality Date    HX CYST REMOVAL      lower back    HX HERNIA REPAIR      HX TONSILLECTOMY            Family Medical History:       Problem Relation (Age of Onset)    Anesthesia Complications Father    Arthritis-osteo Mother, Father    Asthma Mother, Father, Brother, Paternal Grandmother, Paternal Grandfather    Blood Clots Father, Paternal Grandmother, Paternal Grandfather    Cancer Mother, Father    Congestive Heart Failure Father, Paternal Grandmother, Paternal Grandfather    Coronary Artery Disease Father, Paternal Grandmother, Paternal Grandfather    Diabetes Mother, Father    Heart Attack Father, Paternal Grandmother, Paternal Actor  High Cholesterol Mother, Father    Hypertension (High Blood Pressure) Mother, Father    Stroke Father, Maternal Grandmother, Maternal Grandfather, Paternal Grandmother, Paternal Grandfather            Social History     Socioeconomic History    Marital status: Married   Tobacco Use    Smoking status: Every Day     Current packs/day: 1.00     Average packs/day: 1 pack/day for 30.0 years (30.0 ttl pk-yrs)     Types: Cigarettes    Smokeless tobacco: Never   Vaping Use    Vaping status: Never Used   Substance and Sexual Activity    Alcohol use: Yes     Alcohol/week: 8.0 standard drinks of alcohol     Types: 8 Cans of beer per week     Comment: occasional    Drug use: Never     Social Determinants of Health     Financial Resource Strain: Low Risk  (11/11/2021)    Financial  Resource Strain     SDOH Financial: No   Transportation Needs: Low Risk  (11/11/2021)    Transportation Needs     SDOH Transportation: No   Social Connections: Low Risk  (04/01/2023)    Social Connections     SDOH Social Isolation: 5 or more times a week   Intimate Partner Violence: Low Risk  (11/11/2021)    Intimate Partner Violence     SDOH Domestic Violence: No   Housing Stability: Low Risk  (11/11/2021)    Housing Stability     SDOH Housing Situation: I have housing.     SDOH Housing Worry: No       Current Outpatient Medications   Medication Instructions    albuterol sulfate (PROVENTIL OR VENTOLIN OR PROAIR) 90 mcg/actuation Inhalation oral inhaler 1-2 Puffs, Inhalation, EVERY 6 HOURS PRN    budesonide-formoteroL (SYMBICORT) 160-4.5 mcg/actuation Inhalation oral inhaler 2 Puffs, Inhalation, 2 TIMES DAILY    cholestyramine-sucrose (QUESTRAN) 4 gram Oral Powder in Packet 1 Packet, Oral, DAILY    Mesalamine (APRISO) 1,500 mg, Oral, DAILY    omeprazole (PRILOSEC) 40 mg, Oral, DAILY    spironolactone (ALDACTONE) 50 mg Oral Tablet 1 Tablet, Oral, DAILY      Allergies   Allergen Reactions    Noctec [Chloral Hydrate] Anaphylaxis    Penicillins Anaphylaxis    Theophylline Shortness of Breath    Flexeril [Cyclobenzaprine] Mental Status Effect     Homicidal ideation    Prednisone Myalgia    Steroids [Steroid] NO Steroids unless approved by Attending Physician        Physical Exam:  Constitutional: no distress  Respiratory: Clear to auscultation bilaterally.   Cardiovascular: S1, S2 normal  Gastrointestinal: Bowel sounds normal, Non-tender, distended  Extremities: bilateral lower extremity edema  Integumentary:  Skin warm and dry  Neurologic: Alert and oriented x3    BP (!) 131/94   Pulse 81   Temp 36.4 C (97.5 F)   Resp 17   Ht 1.676 m (5\' 6" )   Wt 80 kg (176 lb 6.4 oz)   SpO2 98%   BMI 28.47 kg/m        Pain: Numeric 5     Labs:  Lab Results Today:    Results for orders placed or performed during the hospital  encounter of 03/31/23 (from the past 24 hour(s))   CBC   Result Value Ref Range    WBC 13.5 (H) 3.6 - 10.2 x10^3/uL  RBC 3.16 (L) 4.06 - 5.63 x10^6/uL    HGB 11.3 (L) 12.5 - 16.3 g/dL    HCT 54.0 (L) 98.1 - 47.1 %    MCV 105.5 (H) 73.0 - 96.2 fL    MCH 35.6 (H) 23.8 - 33.4 pg    MCHC 33.7 32.5 - 36.3 g/dL    RDW 19.1 (H) 47.8 - 16.2 %    PLATELETS 373 140 - 440 x10^3/uL    MPV 7.0 (L) 7.4 - 11.4 fL   COMPREHENSIVE METABOLIC PANEL, NON-FASTING   Result Value Ref Range    SODIUM 137 136 - 145 mmol/L    POTASSIUM 3.3 (L) 3.5 - 5.1 mmol/L    CHLORIDE 102 98 - 107 mmol/L    CO2 TOTAL 27 21 - 31 mmol/L    ANION GAP 8 4 - 13 mmol/L    BUN 2 (L) 7 - 25 mg/dL    CREATININE 2.95 (L) 0.60 - 1.30 mg/dL    BUN/CREA RATIO 4 (L) 6 - 22    ESTIMATED GFR 127 >59 mL/min/1.60m^2    ALBUMIN 3.4 (L) 3.5 - 5.7 g/dL    CALCIUM 8.0 (L) 8.6 - 10.3 mg/dL    GLUCOSE 621 (H) 74 - 109 mg/dL    ALKALINE PHOSPHATASE 106 (H) 34 - 104 U/L    ALT (SGPT) 20 7 - 52 U/L    AST (SGOT) 53 (H) 13 - 39 U/L    BILIRUBIN TOTAL 0.7 0.3 - 1.0 mg/dL    PROTEIN TOTAL 6.1 (L) 6.4 - 8.9 g/dL    ALBUMIN/GLOBULIN RATIO 1.3 0.8 - 1.4    OSMOLALITY, CALCULATED 272 270 - 290 mOsm/kg    CALCIUM, CORRECTED 8.5 (L) 8.9 - 10.8 mg/dL    GLOBULIN 2.7 (L) 2.9 - 5.4   MAGNESIUM   Result Value Ref Range    MAGNESIUM 1.9 1.9 - 2.7 mg/dL   LACTIC ACID LEVEL W/ REFLEX FOR LEVEL >2.0   Result Value Ref Range    LACTIC ACID 2.1 0.5 - 2.2 mmol/L   LIPASE   Result Value Ref Range    LIPASE 8 (L) 11 - 82 U/L   SCAN DIFFERENTIAL   Result Value Ref Range    ANISOCYTOSIS 2+ (50%)     POLYCHROMASIA 1+ (10-25%)     PLATELET MORPHOLOGY COMMENT Normal    LACTIC ACID - FIRST REFLEX   Result Value Ref Range    LACTIC ACID 2.9 (H) 0.5 - 2.2 mmol/L   LACTIC ACID - SECOND REFLEX   Result Value Ref Range    LACTIC ACID 1.8 0.5 - 2.2 mmol/L       Imaging Studies:    Images and Reports reviewed to current date.      ACP:  Advance directives discussed and information provided.    Code  Status:  Currently a full code and wishes to remain a full code.    GOC:  Patient resides at home.  We reviewed current condition and plan of care.  Patient states he follows with Dr. Kirtland Bouchard. Patel's office for his UC.  He states he keeps diarrhea because of his UC.  Patient smokes 1 PPD, smoking and potential health risks discussed, smoking cessation advised but he states he is not interested in quitting.  Patient has a fatty liver and drinks 3-4 alcoholic drinks daily.  We discussed how a fatty liver and alcohol use can lead to cirrhosis and discussed assistance with quitting.  Patient states that he drinks alcohol because he  hurts "everywhere from his neck down to his toes" and states that is the only thing that will take the edge off to make the pain tolerable.  He states he has asked his PCP for a referral to a pain clinic but has not heard anything back.  Patient is unable to have community palliative services due to being out of network with his insurance.    PPS prior to hospitalization: 70%    PPS currently: 60%      Persons present and participating in discussion: Patient      Was the conversation voluntary? Yes      Patient Disposition/Discharge needs:  Home    ACP/GOC time:     17 minutes    Total visit time:     47 minutes including ACP/GOC time, Face to face, reviewing medical records, and documentation time.    Thank you for this consult.    Ellene Route, APRN,NP-C  Palliative Care Nurse Practitioner    This note may have been partially generated using [employer] Fluency Direct system and there may be some incorrect words, spellings, and punctuation that were not noted in checking the note before saving.

## 2023-04-03 NOTE — Progress Notes (Signed)
[facility]               IP PROGRESS NOTE      Marinoff, Abdulhadi Hamill  Date of Admission:  03/31/2023  Date of Birth:  1975-07-15  Date of Service:  04/03/2023    Hospital Day:  LOS: 2 days     Subjective:   [name] is a 47 y/o male who is being seen in f/ u for hypokalemia, diarrhea with h/o of UC, difficulty urinating and abdominal and leg swelling with hepatic steatosis. Patient seen and examined at bedside with hospitalist.  Patient with multiple complaints including persistent N/V/D, swelling in his abdomen, legs, and barretts esophagitis.  Patient has UC and barretts and has been seen by GI .      04/03/2023 Patient seen and examined at bedside with hosptialist. States he feels a little better. Afraid he will have problems urinating again when he goes home. Will need Flomax Rx and f/u with urology on d/c .  States stomach swells every time he eats.       Vital Signs:  Temp (24hrs) Max:36.8 C (98.3 F)      Temperature: 36.4 C (97.5 F)  BP (Non-Invasive): (!) 131/94  MAP (Non-Invasive): 107 mmHG  Heart Rate: 81  Respiratory Rate: 17  SpO2: 98 %    Current Medications:  budesonide-formoterol (SYMBICORT) 160 mcg-4.5 mcg per inhalation oral inhaler - "Respiratory to administer", 2 Puff, Inhalation, 2x/day  cholestyramine-sucrose (QUESTRAN) 4 gram packet, 1 Packet, Oral, Daily  enoxaparin PF (LOVENOX) 40 mg/0.4 mL SubQ injection, 40 mg, Subcutaneous, Q24H  famotidine (PEPCID) tablet, 40 mg, Oral, 2x/day  ipratropium-albuterol 0.5 mg-3 mg(2.5 mg base)/3 mL Solution for Nebulization, 3 mL, Nebulization, Q4H PRN  levoFLOXacin (LEVAQUIN) 750 mg in D5W 150 mL premix IVPB, 750 mg, Intravenous, Q24H  magnesium sulfate 1 G in D5W 100 mL premix IVPB, 1 g, Intravenous, Once  mesalamine (DELZICOL) capsule with delayed release tablets inside, 800 mg, Oral, 2x/day  metroNIDAZOLE (FLAGYL) 500 mg in NS 100 mL premix IVPB, 500 mg, Intravenous, Q12H  nicotine (NICODERM CQ) transdermal patch (mg/24  hr), 21 mg, Transdermal, Q24H  ondansetron (ZOFRAN) 2 mg/mL injection, 4 mg, Intravenous, Q8H PRN  oxyCODONE (ROXICODONE) immediate release tablet, 5 mg, Oral, Q8H PRN  pantoprazole (PROTONIX) delayed release tablet, 40 mg, Oral, Daily  potassium chloride (K-DUR) extended release tablet, 40 mEq, Oral, 3x/day-Meals  ramelteon (ROZEREM) tablet, 8 mg, Oral, NIGHTLY  spironolactone (ALDACTONE) tablet, 50 mg, Oral, Daily  tamsulosin (FLOMAX) capsule, 0.4 mg, Oral, Daily after Dinner        Current Orders:  Active Orders   Lab    CBC     Frequency: ONE TIME     Number of Occurrences: 1 Occurrences    COMPREHENSIVE METABOLIC PANEL, NON-FASTING     Frequency: ONE TIME     Number of Occurrences: 1 Occurrences    MAGNESIUM     Frequency: ONE TIME     Number of Occurrences: 1 Occurrences   Diet    DIET BLAND Do you want to initiate MNT Protocol? Yes     Frequency: All Meals     Number of Occurrences: 1 Occurrences   Nursing    ACTIVITY     Frequency: UNTIL DISCONTINUED     Number of Occurrences: Until Specified    MEASURE POST VOID RESIDUAL     Frequency: Q VOID     Number of Occurrences: Until Specified    Notify MD Vital  Signs     Frequency: PRN     Number of Occurrences: Until Specified    PT IS HIGH RISK FOR VENOUS THROMBOEMBOLISM     Frequency: CONTINUOUS     Number of Occurrences: Until Specified    PULSE OXIMETRY Q4H     Frequency: Q4H     Number of Occurrences: Until Specified    TELEMETRY MONITORING X 48H     Frequency: CONTINUOUS X 48 HRS     Number of Occurrences: 48 Hours    VITAL SIGNS  Q4H     Frequency: Q4H     Number of Occurrences: Until Specified   Code Status    FULL CODE: ATTEMPT RESUSCITATION / CPR     Frequency: CONTINUOUS     Number of Occurrences: Until Specified     Order Comments: Patient wishes for full ICU level care including advanced airway interventions / mechanical ventilation.     In the event of pulseless cardiac arrest, patient consents to ACLS (advanced cardiac life support) to attempt  resuscitation.  IE - Consents to chest compressions, life support including intubation, mechanical ventilation, defibrillation/cardioversion as indicated.         Consult    IP CONSULT TO GASTROENTEROLOGY Requested Provider; Rogers Seeds     Frequency: ONE TIME     Number of Occurrences: 1 Occurrences    IP CONSULT TO PALLIATIVE CARE     Frequency: ONE TIME     Number of Occurrences: 1 Occurrences   Isolation    CONTACT II ISOLATION     Frequency: CONTINUOUS     Number of Occurrences: Until Specified     Order Comments: USE FOR ENTERIC AND OTHER SPECIAL PATHOGENS     Enteric Examples - C. diff, Norovirus, Hep A;  Special Pathogens - Candida auris  Private Room  Wear gown and gloves when entering room  Wear mask and eye protection if exposure anticipated  Prior to transport, notify receiving department of precautions  Provide patient and family education regarding isolation  Perform hand hygiene with "soap and water" upon exiting  Perform disinfection with a sporicidal         Medications    budesonide-formoterol (SYMBICORT) 160 mcg-4.5 mcg per inhalation oral inhaler - "Respiratory to administer"     Frequency: 2x/day     Dose: 2 Puff     Route: Inhalation    cholestyramine-sucrose (QUESTRAN) 4 gram packet     Frequency: Daily     Dose: 1 Packet     Route: Oral    enoxaparin PF (LOVENOX) 40 mg/0.4 mL SubQ injection     Frequency: Q24H     Dose: 40 mg     Route: Subcutaneous    famotidine (PEPCID) tablet     Frequency: 2x/day     Dose: 40 mg     Route: Oral    ipratropium-albuterol 0.5 mg-3 mg(2.5 mg base)/3 mL Solution for Nebulization     Frequency: Q4H PRN     Dose: 3 mL     Route: Nebulization    levoFLOXacin (LEVAQUIN) 750 mg in D5W 150 mL premix IVPB     Frequency: Q24H     Dose: 750 mg     Route: Intravenous    magnesium sulfate 1 G in D5W 100 mL premix IVPB     Frequency: Once     Dose: 1 g     Route: Intravenous    mesalamine (DELZICOL) capsule with delayed release tablets inside  Frequency: 2x/day      Dose: 800 mg     Route: Oral    metroNIDAZOLE (FLAGYL) 500 mg in NS 100 mL premix IVPB     Frequency: Q12H     Dose: 500 mg     Route: Intravenous    nicotine (NICODERM CQ) transdermal patch (mg/24 hr)     Frequency: Q24H     Dose: 21 mg     Route: Transdermal    ondansetron (ZOFRAN) 2 mg/mL injection     Frequency: Q8H PRN     Dose: 4 mg     Route: Intravenous    oxyCODONE (ROXICODONE) immediate release tablet     Frequency: Q8H PRN     Dose: 5 mg     Route: Oral    pantoprazole (PROTONIX) delayed release tablet     Frequency: Daily     Dose: 40 mg     Route: Oral    potassium chloride (K-DUR) extended release tablet     Frequency: 3x/day-Meals     Dose: 40 mEq     Route: Oral    ramelteon (ROZEREM) tablet     Frequency: NIGHTLY     Dose: 8 mg     Route: Oral    spironolactone (ALDACTONE) tablet     Frequency: Daily     Dose: 50 mg     Route: Oral    tamsulosin (FLOMAX) capsule     Frequency: Daily after Dinner     Dose: 0.4 mg     Route: Oral        Review of Systems:  Focused review of system was completed. Refer to the HPI for ROS details.     Today's Physical Exam:  Physical Exam  Constitutional:       General: He is not in acute distress.     Appearance: Normal appearance. He is not ill-appearing.   HENT:      Head: Normocephalic and atraumatic.   Eyes:      Conjunctiva/sclera: Conjunctivae normal.      Pupils: Pupils are equal, round, and reactive to light.   Cardiovascular:      Rate and Rhythm: Normal rate and regular rhythm.   Pulmonary:      Effort: No respiratory distress.      Breath sounds: No wheezing or rales.   Abdominal:      General: Bowel sounds are normal. There is no distension.      Palpations: There is hepatomegaly.   Musculoskeletal:      Right lower leg: Edema present.      Left lower leg: Edema present.   Skin:     General: Skin is warm.      Coloration: Skin is not jaundiced.   Neurological:      General: No focal deficit present.      Mental Status: He is alert and oriented to person,  place, and time.   Psychiatric:         Mood and Affect: Mood normal.         Thought Content: Thought content normal.         Judgment: Judgment normal.        I/O:  I/O last 24 hours:    Intake/Output Summary (Last 24 hours) at 04/03/2023 1304  Last data filed at 04/03/2023 1200  Gross per 24 hour   Intake 1230 ml   Output 950 ml   Net 280 ml     I/O current shift:  10/14 0700 - 10/14 1859  In: 590 [P.O.:240]  Out: 950 [Urine:950]  Labs:  Reviewed: I have reviewed all lab results.  Lab Results Today:    Results for orders placed or performed during the hospital encounter of 03/31/23 (from the past 24 hour(s))   GOLD TOP TUBE   Result Value Ref Range    RAINBOW/EXTRA TUBE AUTO RESULT Yes    CBC   Result Value Ref Range    WBC 13.5 (H) 3.6 - 10.2 x10^3/uL    RBC 3.16 (L) 4.06 - 5.63 x10^6/uL    HGB 11.3 (L) 12.5 - 16.3 g/dL    HCT 47.8 (L) 29.5 - 47.1 %    MCV 105.5 (H) 73.0 - 96.2 fL    MCH 35.6 (H) 23.8 - 33.4 pg    MCHC 33.7 32.5 - 36.3 g/dL    RDW 62.1 (H) 30.8 - 16.2 %    PLATELETS 373 140 - 440 x10^3/uL    MPV 7.0 (L) 7.4 - 11.4 fL   COMPREHENSIVE METABOLIC PANEL, NON-FASTING   Result Value Ref Range    SODIUM 137 136 - 145 mmol/L    POTASSIUM 3.3 (L) 3.5 - 5.1 mmol/L    CHLORIDE 102 98 - 107 mmol/L    CO2 TOTAL 27 21 - 31 mmol/L    ANION GAP 8 4 - 13 mmol/L    BUN 2 (L) 7 - 25 mg/dL    CREATININE 6.57 (L) 0.60 - 1.30 mg/dL    BUN/CREA RATIO 4 (L) 6 - 22    ESTIMATED GFR 127 >59 mL/min/1.49m^2    ALBUMIN 3.4 (L) 3.5 - 5.7 g/dL    CALCIUM 8.0 (L) 8.6 - 10.3 mg/dL    GLUCOSE 846 (H) 74 - 109 mg/dL    ALKALINE PHOSPHATASE 106 (H) 34 - 104 U/L    ALT (SGPT) 20 7 - 52 U/L    AST (SGOT) 53 (H) 13 - 39 U/L    BILIRUBIN TOTAL 0.7 0.3 - 1.0 mg/dL    PROTEIN TOTAL 6.1 (L) 6.4 - 8.9 g/dL    ALBUMIN/GLOBULIN RATIO 1.3 0.8 - 1.4    OSMOLALITY, CALCULATED 272 270 - 290 mOsm/kg    CALCIUM, CORRECTED 8.5 (L) 8.9 - 10.8 mg/dL    GLOBULIN 2.7 (L) 2.9 - 5.4   MAGNESIUM   Result Value Ref Range    MAGNESIUM 1.9 1.9 - 2.7 mg/dL    LACTIC ACID LEVEL W/ REFLEX FOR LEVEL >2.0   Result Value Ref Range    LACTIC ACID 2.1 0.5 - 2.2 mmol/L   LIPASE   Result Value Ref Range    LIPASE 8 (L) 11 - 82 U/L   SCAN DIFFERENTIAL   Result Value Ref Range    ANISOCYTOSIS 2+ (50%)     POLYCHROMASIA 1+ (10-25%)     PLATELET MORPHOLOGY COMMENT Normal    LACTIC ACID - FIRST REFLEX   Result Value Ref Range    LACTIC ACID 2.9 (H) 0.5 - 2.2 mmol/L   LACTIC ACID - SECOND REFLEX   Result Value Ref Range    LACTIC ACID 1.8 0.5 - 2.2 mmol/L     Micro Results: No results found for any visits on 03/31/23 (from the past 24 hour(s)).  Images:   No results found.   XR KUB   Final Result   No acute finding.            Radiologist location ID: [id number]         CT  ABDOMEN PELVIS W IV CONTRAST   Final Result   Diffuse wall thickening of the colon most consistent with colitis. This may BE infectious or inflammatory nature. A follow-up colonoscopy is recommended for better evaluation of the colon.      Other findings as above.          One or more dose reduction techniques were used (e.g., Automated exposure control, adjustment of the mA and/or kV according to patient size, use of iterative reconstruction technique).         Radiologist location ID: [id number]              Problem List:  Active Hospital Problems   (*Primary Problem)    Diagnosis    *Hypokalemia         Assessment/ Plan:   #Hypokalemia  Likely 2/2 chronic diarrhea with poorly controlled ulcerative colitis  -tele  -Replace  -Mg low as well, replace   -resume home aldactone  -cdiff neg  -biofire pending       #BPH  #Difficulty urinating  Chronic issue, improves on Flomax but ran out.  -re-start flomax  -Bladder scan  -UA negative for nitrites, leukocytes , 1 wbc   -will need Rx for Flomax and urology f/u on d/c      #Ulcerative Colitis  -continue mesalamine  -needs to follow up with GI right after d/c      #Leukocytosis  Appears to be chronic, likely 2/2 UC       #hx of borderline B12  -B12 392      #LE/abdominal edema  -trace pitting edema b/l LE  -Resume home spironolactone     #Hepatic steatosis  -finding on CT  -GI f/u outpatient     #Tobacco use  -prn NRT     #EOTH use  -reports 3-4 drinks daily     #Hx of COPD     #Hx of OSA  -no longer has CPAP due to noncompliance    Likely discharge tomorrow with GI and urology f/u       -further treatment adjustments will be made based off clinical course.  See orders for further details.  -Hospitalist personally evaluated and examined the patient in conjunction with the MLP and agree with the assessments, treatment plan and disposition of the patient as recorded by the [facility].     DVT/PE Prophylaxis: Enoxaparin    Ruben Reason, PA-C

## 2023-04-04 ENCOUNTER — Inpatient Hospital Stay (HOSPITAL_COMMUNITY): Payer: Medicaid Other

## 2023-04-04 DIAGNOSIS — R140 Abdominal distension (gaseous): Secondary | ICD-10-CM

## 2023-04-04 DIAGNOSIS — Z91148 Patient's other noncompliance with medication regimen for other reason: Secondary | ICD-10-CM

## 2023-04-04 LAB — GI PANEL BY BIOFIRE FILM ARRAY
ADENOVIRUS F 40/41: NOT DETECTED
ASTROVIRUS: NOT DETECTED
CAMPYLOBACTER: NOT DETECTED
CRYPTOSPORIDIUM: NOT DETECTED
CYCLOSPORA CAYETANENSIS: NOT DETECTED
ENTAMOEBA HISTOLYTICA: NOT DETECTED
ENTEROAGGREGATIVE E. COLI (EAEC): NOT DETECTED
ENTEROPATHOGENIC E COLI (EPEC): DETECTED — AB
ENTEROTOXIGENIC E COLI (ETEC) LT/ST: NOT DETECTED
GIARDIA LAMBLIA: NOT DETECTED
NOROVIRUS GI/GII: NOT DETECTED
PLESIOMONAS SHIGELLOIDES: NOT DETECTED
ROTAVIRUS A: NOT DETECTED
SALMONELLA SPECIES: NOT DETECTED
SAPOVIRUS: NOT DETECTED
SHIGA-LIKE TOXIN-PRODUCING E COLI (STEC) STX1/STX2: NOT DETECTED
SHIGELLA/ENTEROINVASIVE E COLI (EIEC): NOT DETECTED
VIBRIO CHOLERAE: NOT DETECTED
VIBRIO: NOT DETECTED
YERSINIA ENTEROCOLITICA: NOT DETECTED

## 2023-04-04 LAB — CBC
HCT: 32.3 % — ABNORMAL LOW (ref 36.7–47.1)
HGB: 10.8 g/dL — ABNORMAL LOW (ref 12.5–16.3)
MCH: 35.3 pg — ABNORMAL HIGH (ref 23.8–33.4)
MCHC: 33.4 g/dL (ref 32.5–36.3)
MCV: 105.8 fL — ABNORMAL HIGH (ref 73.0–96.2)
MPV: 7.1 fL — ABNORMAL LOW (ref 7.4–11.4)
PLATELETS: 430 10*3/uL (ref 140–440)
RBC: 3.05 10*6/uL — ABNORMAL LOW (ref 4.06–5.63)
RDW: 23.9 % — ABNORMAL HIGH (ref 12.1–16.2)
WBC: 13 10*3/uL — ABNORMAL HIGH (ref 3.6–10.2)

## 2023-04-04 LAB — COMPREHENSIVE METABOLIC PANEL, NON-FASTING
ALBUMIN/GLOBULIN RATIO: 1.1 (ref 0.8–1.4)
ALBUMIN: 3.3 g/dL — ABNORMAL LOW (ref 3.5–5.7)
ALKALINE PHOSPHATASE: 108 U/L — ABNORMAL HIGH (ref 34–104)
ALT (SGPT): 23 U/L (ref 7–52)
ANION GAP: 2 mmol/L — ABNORMAL LOW (ref 4–13)
AST (SGOT): 75 U/L — ABNORMAL HIGH (ref 13–39)
BILIRUBIN TOTAL: 0.7 mg/dL (ref 0.3–1.0)
BUN/CREA RATIO: 4 — ABNORMAL LOW (ref 6–22)
BUN: 2 mg/dL — ABNORMAL LOW (ref 7–25)
CALCIUM, CORRECTED: 9 mg/dL (ref 8.9–10.8)
CALCIUM: 8.4 mg/dL — ABNORMAL LOW (ref 8.6–10.3)
CHLORIDE: 106 mmol/L (ref 98–107)
CO2 TOTAL: 27 mmol/L (ref 21–31)
CREATININE: 0.47 mg/dL — ABNORMAL LOW (ref 0.60–1.30)
ESTIMATED GFR: 131 mL/min/{1.73_m2} (ref >59)
GLOBULIN: 3.1 (ref 2.9–5.4)
GLUCOSE: 103 mg/dL (ref 74–109)
OSMOLALITY, CALCULATED: 267 mosm/kg — ABNORMAL LOW (ref 270–290)
POTASSIUM: 4.4 mmol/L (ref 3.5–5.1)
PROTEIN TOTAL: 6.4 g/dL (ref 6.4–8.9)
SODIUM: 135 mmol/L — ABNORMAL LOW (ref 136–145)

## 2023-04-04 LAB — SCAN DIFFERENTIAL: PLATELET MORPHOLOGY COMMENT: NORMAL

## 2023-04-04 LAB — MAGNESIUM: MAGNESIUM: 2 mg/dL (ref 1.9–2.7)

## 2023-04-04 MED ORDER — FAMOTIDINE 40 MG TABLET
40.0000 mg | ORAL_TABLET | Freq: Two times a day (BID) | ORAL | 0 refills | Status: AC
Start: 2023-04-04 — End: 2023-05-04

## 2023-04-04 MED ORDER — SIMETHICONE 80 MG CHEWABLE TABLET
80.0000 mg | CHEWABLE_TABLET | Freq: Four times a day (QID) | ORAL | Status: DC | PRN
Start: 2023-04-04 — End: 2023-04-06

## 2023-04-04 MED ORDER — METRONIDAZOLE 500 MG TABLET
500.0000 mg | ORAL_TABLET | Freq: Two times a day (BID) | ORAL | 0 refills | Status: AC
Start: 2023-04-04 — End: 2023-04-09

## 2023-04-04 MED ORDER — LEVOFLOXACIN 500 MG TABLET
500.0000 mg | ORAL_TABLET | ORAL | 0 refills | Status: AC
Start: 2023-04-04 — End: 2023-04-09

## 2023-04-04 MED ORDER — TAMSULOSIN 0.4 MG CAPSULE: 0.4000 mg | ORAL_CAPSULE | Freq: Every evening | ORAL | 0 refills | Status: AC

## 2023-04-04 NOTE — Progress Notes (Addendum)
[facility]  Gastroenterology/ Hepatology Brief Progress Note      [name]  Date of service: 04/04/2023    Chart reviewed. Stool testing showed Enteropathogenic E. Coli. Has been on Levaquin and Flagyl. WBC remain elevated, although he is only on day 3 of antibiotics. Hx of ulcerative colitis. Ideally, would like stool infection to clear before doing colonoscopy so that UC can be distinguished from infectious colitis. Will schedule for Flexible Sigmoidoscopy tomorrow. Repeat KUB today.     Rogers Seeds, MD  Addendum: Outpatient colonoscopy 12/20/2022 showed mild colitis up to 40 cm, with normal visualized mucosa elsewhere.

## 2023-04-04 NOTE — Progress Notes (Signed)
Patient's d/c held .   Patient remains distended.  Does not feel he is getting answers.   GI to reevaluate patient in am. Abdominal u/s ordered.

## 2023-04-04 NOTE — Nurses Notes (Signed)
Informed patient to use urinal and let this nurse know when he voided to be bladder scanned for post residual.. Pt fussed and stated, "I really have to let you know every time I piss?"   Pt informed this nurse twice only last night. See LDAW for information.

## 2023-04-04 NOTE — Discharge Summary (Addendum)
[place] MEDICINE [facility]     DISCHARGE SUMMARY      PATIENT NAME:  [name]   MRN:  [id number]  DOB:  05-Sep-1975    INPATIENT ADMISSION DATE: 03/31/2023   DATE OF DISCHARGE:  04/06/23     ATTENDING PHYSICIAN: Ruben Reason, PA-C     PRIMARY CARE PHYSICIAN: Mariah Milling, DO     HOSPITAL PRESENTATION:    Please see full admission H&P for details.      As per HPI:  04/01/2023      [name] is a 47 y.o. male with a PMH of COPD, ongoing tobacco abuse, ulcerative colitis, GERD, HLD, HTN, BPH, who presents with abnormal labs from his PCP (potassium 2.1).     Patient went to see PCP for difficulty urinating, labs were drawn and patient was told to home to the ER after potassium was found to be low. Patient reports this has been a recurrent issue for him, when he takes potassium regularly he gets high potassium and gets taken off. He reports chronic diarrhea (5-6x/d) due to UC, follows with Dr.Patel, but feels he has not improved much. He reports difficulty emptying his bladder, as well as pain with trying to urinate. He reports bilateral LE edema, abdominal edema, abdominal pain, chest pain, shortness of breath, back pain. Pt smokes 1 PPD, drinks 3-4 alcoholic beverages per day.Patient was admitted in May with nearly identical complaints. Patient is noted to be non-compliant with medications and follow up appointments.         FURTHER HOSPITAL COURSE WITH DISCHARGE DIAGNOSES:  Following admission patient was evaluated for symptoms suggestive of ulcerative colitis flare and he was also found to enteropathogenic E coli. CT abdomen pelvis showed diffuse wall thickening of the colon most consistent with colitis. This may BE infectious or inflammatory nature. A follow-up colonoscopy is recommended for better evaluation of the colonHis potassium was replaced with po and IV potassium.  Magnesium also replaced as needed. He was restarted on his Flomax and his urinary retention symptoms  resolved.  GI biofire was positive for Ecoli EPEC.  His WBC has remained elevated despite treatment with antibiotics. Looking back in patient's records, this has been elevated for several months , maybe due to UC  but will need further workup by PCP.  His symptoms have overall improved though he remains distended.  He was seen and evaluated by gastroenterologist performed flex sigmoidoscopy that showed limited visualization trace patchy colitis and small internal hemorrhoids.  Plan is to complete antibiotics for enteropathogenic E coli under repeat colonoscopy in 4 weeks to reassess ulcerative colitis.  Patient will be discharged follow up with primary care provider gastroenterologist, Dr Concha Se for outpatient colonoscopy and further liver evaluation. We will give him a Rx for Flomax and he is to f/u with urology as well.  Patient verbalized understanding discharge and follow-up instructions.  All questions answered.        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 Yes; Additional modifications/limitations: LOW RESIDUE, NO DAIRY PRODUCTS, GI/SOFT             Additional clinical characteristics related to nutrition:    - monitor for weight changes   - monitor intake and output    - monitor bowel functions        Lab Results   Component Value Date    ALBUMIN 3.3 (L) 04/04/2023          PHYSICAL EXAM   DAY OF DISCHARGE:    BP 123/89   Pulse 87   Temp 36.5 C (97.7 F)   Resp 16   Ht 1.676 m (5\' 6" )   Wt 80 kg (176 lb 6.4 oz)   SpO2 94%   BMI 28.47 kg/m          General:  Patient is resting in bed, no acute distress, alert and oriented x3    Eyes:  PERRL, no scleral icterus   HENT:  Normocephalic, atraumatic, oral mucosa is moist and pink, no nasal discharge   Heart:  RRR  Lungs:  Unlabored respirations.  Lungs are clear to auscultation bilaterally, no wheezes, no rales  Abdomen:  Soft, active bowel sounds, non-tender to palpation,mild-to-moderate distention but nontender.  Hepatomegaly.  Extremities: No edema in lower extremities bilaterally   Skin:  Warm and dry.  Not diaphoretic  Neuro:  A&O x 3.  No focal deficits.  Speech intact.  Not tremulous  Psych:  Cooperative, not agitated    LABS:  CBC with Diff (Last 48 Hours):    Recent Results in last 48 hours     04/06/23  0502   WBC 14.7*   HGB 12.6   HCT 37.1   MCV 106.5*   PLTCNT 532*   PMNS 64   LYMPHOCYTES 21   MONOCYTES 12   EOSINOPHIL 3   BASOPHILS 1  0.10          Last BMP  (Last result in the past 48 hours)        Na   K   Cl   CO2   BUN   Cr   Calcium   Glucose   Glucose-Fasting        04/06/23 0502 134   4.9   100   25   4   0.53   9.5   98                  BMP (Last 48 Hours):    Recent Results in last 48 hours     04/06/23  0502   SODIUM 134*   POTASSIUM 4.9   CHLORIDE 100   CO2 25   BUN 4*   CREATININE 0.53*   CALCIUM 9.5   GLUCOSENF 98          DISCHARGE MEDICATIONS:     Current Discharge Medication List        START taking these medications.        Details   levoFLOXacin 500 mg Tablet  Commonly known as: LEVAQUIN   500 mg, Oral, EVERY 24 HOURS  Qty: 5 Tablet  Refills: 0     metroNIDAZOLE 500 mg Tablet  Commonly known as: FLAGYL   500 mg, Oral, 2 TIMES DAILY  Qty: 10 Tablet  Refills: 0     simethicone 80 mg  Tablet, Chewable  Commonly known as: MYLICON   80 mg, Oral, EVERY 6 HOURS PRN  Qty: 28 Tablet  Refills: 0            CONTINUE these medications - NO CHANGES were made during your visit.        Details   albuterol sulfate 90 mcg/actuation oral inhaler  Commonly known as: PROVENTIL or VENTOLIN or PROAIR   1-2 Puffs, Inhalation, EVERY 6 HOURS PRN  Refills: 0     Apriso 0.375 gram  Capsule, Sust. Release 24 hr  Generic drug: Mesalamine   1,500 mg, Oral, DAILY  Refills: 0     budesonide-formoteroL 160-4.5 mcg/actuation oral inhaler  Commonly known as: SYMBICORT   2 Puffs, Inhalation, 2 TIMES DAILY  Refills: 0     cholestyramine-sucrose 4 gram Powder in Packet  Commonly known as: QUESTRAN   1 Packet, Oral, DAILY  Refills: 0     famotidine 40 mg Tablet  Commonly known as: PEPCID   40 mg, Oral, 2 TIMES DAILY  Qty: 60 Tablet  Refills: 0     omeprazole 40 mg Capsule, Delayed Release(E.C.)  Commonly known as: PRILOSEC   40 mg, Oral, DAILY  Refills: 0     spironolactone 50 mg Tablet  Commonly known as: ALDACTONE   1 Tablet, Oral, DAILY  Refills: 0     tamsulosin 0.4 mg Capsule  Commonly known as: FLOMAX   0.4 mg, Oral, EVERY EVENING AFTER DINNER  Qty: 30 Capsule  Refills: 0                 DISCHARGE DISPOSITION:  Home with home health follow up primary care provider and gastroenterologist.    DISCHARGE INSTRUCTIONS:    Follow-up Information       Mariah Milling, DO Follow up.    Specialty: FAMILY MEDICINE  Why: Patient to follow up with Dr Mariah Milling as a walk-in within one week of hospital discharge date  Contact information:  [address]  [place] [address]  [phone]               Rogers Seeds, MD Follow up.    Specialty: GASTROENTEROLOGY  Why: re: ulcerative colitis flare up  Contact information:  [address]  [place] [address]  [phone]                              COMPREHENSIVE METABOLIC PANEL, NON-FASTING    Results to Dr Kirtland Bouchard patel and patient's PCP     Release to patient Automated [100001]      DISCHARGE INSTRUCTION - CARDIAC DIET     Diet: CARDIAC DIET      DISCHARGE INSTRUCTION - ACTIVITY     Activity: AS TOLERATED       Follow-up Information       Mariah Milling, DO Follow up.    Specialty: FAMILY MEDICINE  Why: Patient to follow up with Dr Mariah Milling as a walk-in within one week of hospital discharge date  Contact information:  [address]  [place]  [address]  [phone]               Rogers Seeds, MD Follow up.    Specialty: GASTROENTEROLOGY  Why: re: ulcerative colitis flare up  Contact information:  [address]  [place] [address]  [phone]  Copies sent to Care Team         Relationship Specialty Notifications Start End    Mariah Milling, DO PCP - General FAMILY PRACTICE  09/16/20     Phone: [phone] Fax: [phone]         [address] [place] [address]            Discharge and follow-up plan discussed with patient and family who verbalized understanding and agreed.  Time spent, 40  minutes.      Patient discharged following GI re-evaluation.    Omelia Blackwater MD.  [facility] MEDICINE HOSPITALIST

## 2023-04-04 NOTE — Care Plan (Signed)
[facility]  Care Plan Note    Situation: Pt. Admitted for hypokalemia.     Intervention: Vitals and labs monitored, pain monitored and managed, assistance with ADLs, education provided, IV fluid therapy maintained, fall/skin precautions, discharge planning ongoing.    Response: Pt. Is calm and cooperative at this time.      Deloria Lair, RN      Problem: Adult Inpatient Plan of Care  Goal: Plan of Care Review  Outcome: Ongoing (see interventions/notes)  Goal: Patient-Specific Goal (Individualized)  Outcome: Ongoing (see interventions/notes)  Flowsheets (Taken 04/04/2023 0830)  Individualized Care Needs: monitor vitals and labs, monitor and manage pain, assist with ADLs, electrolytes monitored, discharge planning ongoing  Anxieties, Fears or Concerns: pt. concerned about his abdominal distention  Patient-Specific Goals (Include Timeframe): prepare the pt. for discharge  Goal: Absence of Hospital-Acquired Illness or Injury  Outcome: Ongoing (see interventions/notes)  Intervention: Identify and Manage Fall Risk  Recent Flowsheet Documentation  Taken 04/04/2023 0830 by Ashley Mariner, RN  Safety Promotion/Fall Prevention:   activity supervised   fall prevention program maintained   nonskid shoes/slippers when out of bed   safety round/check completed  Intervention: Prevent and Manage VTE (Venous Thromboembolism) Risk  Recent Flowsheet Documentation  Taken 04/04/2023 0830 by Ashley Mariner, RN  VTE Prevention/Management:   ambulation promoted   dorsiflexion/plantar flexion performed   anticoagulant therapy maintained  Goal: Optimal Comfort and Wellbeing  Outcome: Ongoing (see interventions/notes)  Goal: Rounds/Family Conference  Outcome: Ongoing (see interventions/notes)     Problem: Electrolyte Imbalance  Goal: Electrolyte Imbalance: Plan of Care  Outcome: Ongoing (see interventions/notes)     Problem: Pain Acute  Goal: Optimal Pain Control and Function  Outcome: Ongoing (see interventions/notes)

## 2023-04-05 ENCOUNTER — Encounter (HOSPITAL_COMMUNITY)
Admission: EM | Disposition: A | Discharge status: Home Health | Payer: Self-pay | Source: Home / Self Care | Attending: HOSPITALIST

## 2023-04-05 ENCOUNTER — Inpatient Hospital Stay (HOSPITAL_COMMUNITY): Payer: Medicaid Other

## 2023-04-05 DIAGNOSIS — R609 Edema, unspecified: Secondary | ICD-10-CM

## 2023-04-05 DIAGNOSIS — J449 Chronic obstructive pulmonary disease, unspecified: Secondary | ICD-10-CM

## 2023-04-05 DIAGNOSIS — K5190 Ulcerative colitis, unspecified, without complications: Secondary | ICD-10-CM

## 2023-04-05 DIAGNOSIS — F1090 Alcohol use, unspecified, uncomplicated: Secondary | ICD-10-CM

## 2023-04-05 DIAGNOSIS — N400 Enlarged prostate without lower urinary tract symptoms: Secondary | ICD-10-CM

## 2023-04-05 DIAGNOSIS — R110 Nausea: Secondary | ICD-10-CM

## 2023-04-05 DIAGNOSIS — K2270 Barrett's esophagus without dysplasia: Secondary | ICD-10-CM

## 2023-04-05 DIAGNOSIS — B9620 Unspecified Escherichia coli [E. coli] as the cause of diseases classified elsewhere: Secondary | ICD-10-CM

## 2023-04-05 DIAGNOSIS — Z91199 Patient's noncompliance with other medical treatment and regimen due to unspecified reason: Secondary | ICD-10-CM

## 2023-04-05 DIAGNOSIS — Z720 Tobacco use: Secondary | ICD-10-CM

## 2023-04-05 DIAGNOSIS — Z8709 Personal history of other diseases of the respiratory system: Secondary | ICD-10-CM

## 2023-04-05 DIAGNOSIS — K760 Fatty (change of) liver, not elsewhere classified: Secondary | ICD-10-CM

## 2023-04-05 DIAGNOSIS — D72829 Elevated white blood cell count, unspecified: Secondary | ICD-10-CM

## 2023-04-05 DIAGNOSIS — R109 Unspecified abdominal pain: Secondary | ICD-10-CM

## 2023-04-05 SURGERY — SIGMOIDOSCOPY FLEXIBLE
Anesthesia: IV Sedation (Nurse Monitored) | Wound class: Clean Contaminated Wounds-The respiratory, GI, Genital, or urinary

## 2023-04-05 MED ORDER — MIDAZOLAM 5 MG/ML INJECTION WRAPPER
2.0000 mg | INTRAMUSCULAR | Status: AC | PRN
Start: 2023-04-05 — End: 2023-04-05
  Administered 2023-04-05: 2 mg via INTRAVENOUS
  Administered 2023-04-05: 1 mg via INTRAVENOUS
  Administered 2023-04-05: 2 mg via INTRAVENOUS

## 2023-04-05 MED ORDER — FUROSEMIDE 20 MG TABLET
20.0000 mg | ORAL_TABLET | Freq: Every day | ORAL | Status: DC
Start: 2023-04-05 — End: 2023-04-06
  Administered 2023-04-05 – 2023-04-06 (×2): 20 mg via ORAL
  Filled 2023-04-05 (×2): qty 1

## 2023-04-05 MED ORDER — BENZOCAINE 20% METERED DOSE ORAL SPRAY
1.0000 | Freq: Once | ORAL | Status: DC
Start: 2023-04-05 — End: 2023-04-05

## 2023-04-05 MED ORDER — DEXTROSE 5 % IN WATER (D5W) INTRAVENOUS SOLUTION
INTRAVENOUS | Status: DC | PRN
Start: 2023-04-05 — End: 2023-04-05
  Administered 2023-04-05: 125 mL/h via INTRAVENOUS
  Administered 2023-04-05: 0 g

## 2023-04-05 MED ORDER — FENTANYL (PF) 50 MCG/ML INJECTION SOLUTION
INTRAMUSCULAR | Status: AC
Start: 2023-04-05 — End: 2023-04-05
  Filled 2023-04-05: qty 4

## 2023-04-05 MED ORDER — NALOXONE 0.4 MG/ML INJECTION SOLUTION
0.4000 mg | INTRAMUSCULAR | Status: AC | PRN
Start: 2023-04-05 — End: 2023-04-05

## 2023-04-05 MED ORDER — MIDAZOLAM 5 MG/ML INJECTION WRAPPER
INTRAMUSCULAR | Status: AC
Start: 2023-04-05 — End: 2023-04-05
  Filled 2023-04-05: qty 2

## 2023-04-05 MED ORDER — FENTANYL (PF) 50 MCG/ML INJECTION WRAPPER
25.0000 ug | INJECTION | INTRAMUSCULAR | Status: AC | PRN
Start: 2023-04-05 — End: 2023-04-05
  Administered 2023-04-05 (×3): 25 ug via INTRAVENOUS

## 2023-04-05 MED ORDER — FLUMAZENIL 0.1 MG/ML INTRAVENOUS SOLUTION
200.0000 ug | INTRAVENOUS | Status: AC | PRN
Start: 2023-04-05 — End: 2023-04-05

## 2023-04-05 SURGICAL SUPPLY — 1 items: DETERGENT INSTR 22OZ TRNSPT GEL RINSE FREE NEUT PH PREKLENZ CLR PLSNT LF (MISCELLANEOUS PT CARE ITEMS) ×1 IMPLANT

## 2023-04-05 NOTE — Progress Notes (Addendum)
[facility]               IP PROGRESS NOTE      Zur, Orey Haider  Date of Admission:  03/31/2023  Date of Birth:  09-Mar-1976  Date of Service:  04/05/2023    Hospital Day:  LOS: 4 days     Subjective:   [name] is a 47 y/o male who is being seen in f/ u for hypokalemia, diarrhea with h/o of UC, difficulty urinating and abdominal and leg swelling with hepatic steatosis. Patient seen and examined at bedside with hospitalist.  Patient with multiple complaints including persistent N/V/D, swelling in his abdomen, legs, and barretts esophagitis.  Patient has UC and barretts and has been seen by GI .      04/03/2023 Patient seen and examined at bedside with hosptialist. States he feels a little better. Afraid he will have problems urinating again when he goes home. Will need Flomax Rx and f/u with urology on d/c .  States stomach swells every time he eats.     04/05/2023  Patient seen this morning seated at bedside having clear liquid diet for breakfast while awaiting flex sigmoidoscopy planned this afternoon.  Continues of abdominal distention but denying fever chills or rigors.      Vital Signs:  Temp (24hrs) Max:37.1 C (98.7 F)      Temperature: 36.7 C (98.1 F)  BP (Non-Invasive): 112/76  MAP (Non-Invasive): 89 mmHG  Heart Rate: 85  Respiratory Rate: 16  SpO2: 95 %    Current Medications:  budesonide-formoterol (SYMBICORT) 160 mcg-4.5 mcg per inhalation oral inhaler - "Respiratory to administer", 2 Puff, Inhalation, 2x/day  cholestyramine-sucrose (QUESTRAN) 4 gram packet, 1 Packet, Oral, Daily  enoxaparin PF (LOVENOX) 40 mg/0.4 mL SubQ injection, 40 mg, Subcutaneous, Q24H  famotidine (PEPCID) tablet, 40 mg, Oral, 2x/day  ipratropium-albuterol 0.5 mg-3 mg(2.5 mg base)/3 mL Solution for Nebulization, 3 mL, Nebulization, Q4H PRN  levoFLOXacin (LEVAQUIN) 750 mg in D5W 150 mL premix IVPB, 750 mg, Intravenous, Q24H  magnesium sulfate 1 G in D5W 100 mL premix IVPB, 1 g, Intravenous,  Once  mesalamine (DELZICOL) capsule with delayed release tablets inside, 800 mg, Oral, 2x/day  metroNIDAZOLE (FLAGYL) 500 mg in NS 100 mL premix IVPB, 500 mg, Intravenous, Q12H  nicotine (NICODERM CQ) transdermal patch (mg/24 hr), 21 mg, Transdermal, Q24H  ondansetron (ZOFRAN) 2 mg/mL injection, 4 mg, Intravenous, Q8H PRN  oxyCODONE (ROXICODONE) immediate release tablet, 5 mg, Oral, Q8H PRN  pantoprazole (PROTONIX) delayed release tablet, 40 mg, Oral, Daily  potassium chloride (K-DUR) extended release tablet, 40 mEq, Oral, 3x/day-Meals  ramelteon (ROZEREM) tablet, 8 mg, Oral, NIGHTLY  simethicone (MYLICON) chewable tablet, 80 mg, Oral, Q6H PRN  spironolactone (ALDACTONE) tablet, 50 mg, Oral, Daily  tamsulosin (FLOMAX) capsule, 0.4 mg, Oral, Daily after Dinner        Current Orders:  Active Orders   Diet    DIET CLEAR LIQUID Do you want to initiate MNT Protocol? Yes     Frequency: All Meals     Number of Occurrences: 1 Occurrences    DIET NPO - SPECIFIC DATE & TIME EXCEPT ALL MEDS WITH SIPS OF WATER, EXCEPT ICE CHIPS, EXCEPT SIPS OF WATER     Frequency: ALL MEALS (NPO ONLY)     Number of Occurrences: 1 Occurrences   Nursing    ACTIVITY     Frequency: UNTIL DISCONTINUED     Number of Occurrences: Until Specified    MEASURE POST VOID RESIDUAL  Frequency: Q VOID     Number of Occurrences: Until Specified    Notify MD Vital Signs     Frequency: PRN     Number of Occurrences: Until Specified    PT IS HIGH RISK FOR VENOUS THROMBOEMBOLISM     Frequency: CONTINUOUS     Number of Occurrences: Until Specified    PULSE OXIMETRY Q4H     Frequency: Q4H     Number of Occurrences: Until Specified    REFERRAL TO [email or web address] - FOR ENROLLMENT IN FREE, ONLINE GROUP CLASSES FOR TOBACCO CESSATION     Frequency: ONE TIME     Number of Occurrences: 1 Occurrences     Order Comments: Visit [email or web address] to access FREE online modules to take at your own pace or group classes to help you quit tobacco.       Scheduling  Instructions:      Visit [email or web address] to access FREE online modules to take at your own pace or group classes to help you quit tobacco.    TELEMETRY MONITORING X 48H     Frequency: CONTINUOUS X 48 HRS     Number of Occurrences: 48 Hours    TOBACCO CESSATION: I HAVE DISCUSSED REFERRAL FOR TOBACCO CESSATION COUNSELING AND INITIATION OF NICOTINE REPLACMENT THERAPY WITH THIS PATIENT.     Frequency: ONE TIME     Number of Occurrences: 1 Occurrences    VITAL SIGNS  Q4H     Frequency: Q4H     Number of Occurrences: Until Specified   Code Status    FULL CODE: ATTEMPT RESUSCITATION / CPR     Frequency: CONTINUOUS     Number of Occurrences: Until Specified     Order Comments: Patient wishes for full ICU level care including advanced airway interventions / mechanical ventilation.     In the event of pulseless cardiac arrest, patient consents to ACLS (advanced cardiac life support) to attempt resuscitation.  IE - Consents to chest compressions, life support including intubation, mechanical ventilation, defibrillation/cardioversion as indicated.         Consult    IP CONSULT TO GASTROENTEROLOGY Requested Provider; Rogers Seeds     Frequency: ONE TIME     Number of Occurrences: 1 Occurrences    IP CONSULT TO PALLIATIVE CARE     Frequency: ONE TIME     Number of Occurrences: 1 Occurrences   Schedule Follow Up Orders    REFERRAL TO [place] QUITLINE - TOBACCO CESSATION     Frequency: ONE TIME     Number of Occurrences: 1 Occurrences   Case Request    CASE REQUEST SURGICAL: SIGMOIDOSCOPY FLEXIBLE     Frequency: ONE TIME     Number of Occurrences: 1 Occurrences   Medications    budesonide-formoterol (SYMBICORT) 160 mcg-4.5 mcg per inhalation oral inhaler - "Respiratory to administer"     Frequency: 2x/day     Dose: 2 Puff     Route: Inhalation    cholestyramine-sucrose (QUESTRAN) 4 gram packet     Frequency: Daily     Dose: 1 Packet     Route: Oral    enoxaparin PF (LOVENOX) 40 mg/0.4 mL SubQ injection     Frequency: Q24H     Dose:  40 mg     Route: Subcutaneous    famotidine (PEPCID) tablet     Frequency: 2x/day     Dose: 40 mg     Route: Oral    ipratropium-albuterol 0.5 mg-3 mg(2.5 mg base)/3 mL Solution for Nebulization  Frequency: Q4H PRN     Dose: 3 mL     Route: Nebulization    levoFLOXacin (LEVAQUIN) 750 mg in D5W 150 mL premix IVPB     Frequency: Q24H     Dose: 750 mg     Route: Intravenous    magnesium sulfate 1 G in D5W 100 mL premix IVPB     Frequency: Once     Dose: 1 g     Route: Intravenous    mesalamine (DELZICOL) capsule with delayed release tablets inside     Frequency: 2x/day     Dose: 800 mg     Route: Oral    metroNIDAZOLE (FLAGYL) 500 mg in NS 100 mL premix IVPB     Frequency: Q12H     Dose: 500 mg     Route: Intravenous    nicotine (NICODERM CQ) transdermal patch (mg/24 hr)     Frequency: Q24H     Dose: 21 mg     Route: Transdermal    ondansetron (ZOFRAN) 2 mg/mL injection     Frequency: Q8H PRN     Dose: 4 mg     Route: Intravenous    oxyCODONE (ROXICODONE) immediate release tablet     Frequency: Q8H PRN     Dose: 5 mg     Route: Oral    pantoprazole (PROTONIX) delayed release tablet     Frequency: Daily     Dose: 40 mg     Route: Oral    potassium chloride (K-DUR) extended release tablet     Frequency: 3x/day-Meals     Dose: 40 mEq     Route: Oral    ramelteon (ROZEREM) tablet     Frequency: NIGHTLY     Dose: 8 mg     Route: Oral    simethicone (MYLICON) chewable tablet     Frequency: Q6H PRN     Dose: 80 mg     Route: Oral    spironolactone (ALDACTONE) tablet     Frequency: Daily     Dose: 50 mg     Route: Oral    tamsulosin (FLOMAX) capsule     Frequency: Daily after Dinner     Dose: 0.4 mg     Route: Oral        Review of Systems:  Focused review of system was completed. Refer to the HPI for ROS details.     Today's Physical Exam:  Physical Exam  Constitutional:       General: He is not in acute distress.     Appearance: Normal appearance. He is not ill-appearing.   HENT:      Head: Normocephalic and atraumatic.    Eyes:      Conjunctiva/sclera: Conjunctivae normal.      Pupils: Pupils are equal, round, and reactive to light.   Cardiovascular:      Rate and Rhythm: Normal rate and regular rhythm.   Pulmonary:      Effort: No respiratory distress.      Breath sounds: No wheezing or rales.   Abdominal:      General: Bowel sounds are normal. There is no distension.      Palpations: There is hepatomegaly.   Musculoskeletal:      Right lower leg: Edema present.      Left lower leg: Edema present.   Skin:     General: Skin is warm.      Coloration: Skin is not jaundiced.   Neurological:  General: No focal deficit present.      Mental Status: He is alert and oriented to person, place, and time.   Psychiatric:         Mood and Affect: Mood normal.         Thought Content: Thought content normal.         Judgment: Judgment normal.        I/O:  I/O last 24 hours:    Intake/Output Summary (Last 24 hours) at 04/05/2023 1116  Last data filed at 04/05/2023 1107  Gross per 24 hour   Intake 2295 ml   Output 3475 ml   Net -1180 ml     I/O current shift:  10/16 0700 - 10/16 1859  In: 350   Out: 200 [Urine:200]  Labs:  Reviewed: I have reviewed all lab results.  Lab Results Today:    No results found for any visits on 03/31/23 (from the past 24 hour(s)).    Micro Results: No results found for any visits on 03/31/23 (from the past 24 hour(s)).  Images:   No results found.   XR KUB   Final Result      1. No evidence of mechanical bowel obstruction.            Radiologist location ID: [id number]         US ABDOMEN (FOR ASCITES)   Final Result   NO ASCITES            Radiologist location ID: [id number]         XR KUB   Final Result   No acute finding.            Radiologist location ID: [id number]         CT ABDOMEN PELVIS W IV CONTRAST   Final Result   Diffuse wall thickening of the colon most consistent with colitis. This may BE infectious or inflammatory nature. A follow-up colonoscopy is recommended for better evaluation of the  colon.      Other findings as above.          One or more dose reduction techniques were used (e.g., Automated exposure control, adjustment of the mA and/or kV according to patient size, use of iterative reconstruction technique).         Radiologist location ID: [id number]              Problem List:  Active Hospital Problems   (*Primary Problem)    Diagnosis    *Hypokalemia         Assessment/ Plan:     #Ulcerative colitis  Continue with mesalamine.  Limiting use of systemic steroids as GI.  Patient getting flex sigmoidoscopy today.  We will follow further GI recommendations.  Repeat KUB today with no evidence of mechanical bowel obstruction.  Plan to follow up as scheduled with GI as outpatient.    #Hypokalemia, resolved  Likely 2/2 chronic diarrhea with poorly controlled ulcerative colitis  -resume home aldactone  -cdiff neg  -biofire positive for E coli.       #BPH  #Difficulty urinating  -continue with flomax  -Bladder scan  -UA negative for nitrites, leukocytes , 1 wbc   -Will need Rx for Flomax and urology as outpatient      #Ulcerative Colitis  -continue mesalamine  -GI follow-up and evaluation appreciated.  Plan for flex sigmoidoscopy this afternoon.       #Leukocytosis  Appears to be chronic, likely 2/2 UC    #Hx  of borderline B12  -B12 392     #LE/abdominal edema  -trace pitting edema b/l LE  -Resume home spironolactone with the addition of Lasix.     #Hepatic steatosis  -Finding on CT  -abdominal ultrasound unremarkable for ascites  -GI f/u outpatient as previously scheduled.     #Tobacco use  -prn NRT     #EOTH use  No signs of alcohol withdrawal currently.  -reports 3-4 drinks daily  -we will maintain CIWA protocol     #Hx of COPD   -Currently not in exacerbation    #Hx of OSA  -No longer has CPAP due to noncompliance    Likely discharge tomorrow with GI and urology f/u     Plan of care to be adjusted depending on response to current treatment, further testing and evaluation.      DVT/PE Prophylaxis:  Enoxaparin        Omelia Blackwater MD.  [facility] MEDICINE HOSPITALIST

## 2023-04-05 NOTE — Care Plan (Signed)
No distress n  Problem: Adult Inpatient Plan of Care  Goal: Optimal Comfort and Wellbeing  Intervention: Provide Person-Centered Care  Recent Flowsheet Documentation  Taken 04/05/2023 0800 by Armanda Heritage, RN  Trust Relationship/Rapport: care explained   oted. Waiting on procedure. Presently NPO.

## 2023-04-05 NOTE — OR Surgeon (Signed)
[facility]    FLEXIBLE SIGMOIDOSCOPY          Patient Name: [name]  Age:  47 y.o.  Sex:  male  MRN:  [id number]  CSN:  [id number]  Date of Birth: Dec 30, 1975    Date: 04/05/23     Procedure:    Flexible Sigmoidoscopy                                                ASA: 2     Medications Given:   Versed 5 mg iv, Fentanyl 75 mcg iv by ivcs     Equipment Used:     Olympus [id number]    Indications: abdominal distension, abdominal pain, hx ulcerative colitis     A digital rectal exam was performed with a 360 degree sweep. This was an unprepped exam. Visualization was limited in some areas by solid stool. Scope was passed to 30 cm. During withdrawal, careful attention was paid to the proximal sides of folds, flexures, bends, and the rectal valves.     Trace patchy colitis was seen without ulceration.      Diverticula were not seen. On withdrawal and on retroflexion view small internal hemorrhoids were seen. Air was withdrawn from the colon prior to the end of the procedure. Patient tolerated procedure well. Photograph(s) Taken: Yes; Biopsies were not taken; Blood loss: none;      Impressions:  Limited visualization, scope passed to 30 cm  Trace patchy colitis  Small internal hemorrhoids, grade 1     Recommendations:  Complete antibiotic treatment for Enteropathogenic E. Coli  Colonoscopy as outpatient in 4 weeks to reassess ulcerative colitis  Ok to discharge tomorrow if wbc is improved  Follow up in office in 2 weeks     Rogers Seeds, MD

## 2023-04-06 DIAGNOSIS — A040 Enteropathogenic Escherichia coli infection: Secondary | ICD-10-CM | POA: Diagnosis present

## 2023-04-06 LAB — BASIC METABOLIC PANEL
ANION GAP: 9 mmol/L (ref 4–13)
BUN/CREA RATIO: 8 (ref 6–22)
BUN: 4 mg/dL — ABNORMAL LOW (ref 7–25)
CALCIUM: 9.5 mg/dL (ref 8.6–10.3)
CHLORIDE: 100 mmol/L (ref 98–107)
CO2 TOTAL: 25 mmol/L (ref 21–31)
CREATININE: 0.53 mg/dL — ABNORMAL LOW (ref 0.60–1.30)
ESTIMATED GFR: 127 mL/min/{1.73_m2} (ref >59)
GLUCOSE: 98 mg/dL (ref 74–109)
OSMOLALITY, CALCULATED: 265 mosm/kg — ABNORMAL LOW (ref 270–290)
POTASSIUM: 4.9 mmol/L (ref 3.5–5.1)
SODIUM: 134 mmol/L — ABNORMAL LOW (ref 136–145)

## 2023-04-06 LAB — CBC WITH DIFF
BASOPHIL #: 0.1 10*3/uL (ref 0.00–0.10)
BASOPHIL %: 1 % (ref 0–1)
EOSINOPHIL #: 0.4 10*3/uL (ref 0.00–0.50)
EOSINOPHIL %: 3 % (ref 1–8)
HCT: 37.1 % (ref 36.7–47.1)
HGB: 12.6 g/dL (ref 12.5–16.3)
LYMPHOCYTE #: 3.1 10*3/uL — ABNORMAL HIGH (ref 1.00–3.00)
LYMPHOCYTE %: 21 % (ref 16–44)
MCH: 36.2 pg — ABNORMAL HIGH (ref 23.8–33.4)
MCHC: 34 g/dL (ref 32.5–36.3)
MCV: 106.5 fL — ABNORMAL HIGH (ref 73.0–96.2)
MONOCYTE #: 1.8 10*3/uL — ABNORMAL HIGH (ref 0.30–1.00)
MONOCYTE %: 12 % (ref 5–13)
MPV: 7 fL — ABNORMAL LOW (ref 7.4–11.4)
NEUTROPHIL #: 9.3 10*3/uL — ABNORMAL HIGH (ref 1.85–7.80)
NEUTROPHIL %: 64 % (ref 43–77)
PLATELETS: 532 10*3/uL — ABNORMAL HIGH (ref 140–440)
RBC: 3.49 10*6/uL — ABNORMAL LOW (ref 4.06–5.63)
RDW: 23.5 % — ABNORMAL HIGH (ref 12.1–16.2)
WBC: 14.7 10*3/uL — ABNORMAL HIGH (ref 3.6–10.2)

## 2023-04-06 LAB — SCAN DIFFERENTIAL: PLATELET MORPHOLOGY COMMENT: INCREASED

## 2023-04-06 LAB — MAGNESIUM: MAGNESIUM: 2.1 mg/dL (ref 1.9–2.7)

## 2023-04-06 MED ORDER — SIMETHICONE 80 MG CHEWABLE TABLET
80.0000 mg | CHEWABLE_TABLET | Freq: Four times a day (QID) | ORAL | 0 refills | Status: AC | PRN
Start: 2023-04-06 — End: 2023-04-13

## 2023-04-06 NOTE — Consults (Signed)
[facility]  Gastroenterology/ Hepatology Progress Note      Jmari Dimler  Date of service: 04/06/2023    Subjective      Pt had a discharge order but was held due to concerns of abdominal bloating. Flex sig findings discussed with patient. He is tolerating diet. He ate all his breakfast.     budesonide-formoterol (SYMBICORT) 160 mcg-4.5 mcg per inhalation oral inhaler - "Respiratory to administer", 2 Puff, Inhalation, 2x/day  cholestyramine-sucrose (QUESTRAN) 4 gram packet, 1 Packet, Oral, Daily  enoxaparin PF (LOVENOX) 40 mg/0.4 mL SubQ injection, 40 mg, Subcutaneous, Q24H  famotidine (PEPCID) tablet, 40 mg, Oral, 2x/day  furosemide (LASIX) tablet, 20 mg, Oral, Daily  ipratropium-albuterol 0.5 mg-3 mg(2.5 mg base)/3 mL Solution for Nebulization, 3 mL, Nebulization, Q4H PRN  levoFLOXacin (LEVAQUIN) 750 mg in D5W 150 mL premix IVPB, 750 mg, Intravenous, Q24H  magnesium sulfate 1 G in D5W 100 mL premix IVPB, 1 g, Intravenous, Once  mesalamine (DELZICOL) capsule with delayed release tablets inside, 800 mg, Oral, 2x/day  metroNIDAZOLE (FLAGYL) 500 mg in NS 100 mL premix IVPB, 500 mg, Intravenous, Q12H  nicotine (NICODERM CQ) transdermal patch (mg/24 hr), 21 mg, Transdermal, Q24H  ondansetron (ZOFRAN) 2 mg/mL injection, 4 mg, Intravenous, Q8H PRN  oxyCODONE (ROXICODONE) immediate release tablet, 5 mg, Oral, Q8H PRN  pantoprazole (PROTONIX) delayed release tablet, 40 mg, Oral, Daily  potassium chloride (K-DUR) extended release tablet, 40 mEq, Oral, 3x/day-Meals  ramelteon (ROZEREM) tablet, 8 mg, Oral, NIGHTLY  simethicone (MYLICON) chewable tablet, 80 mg, Oral, Q6H PRN  spironolactone (ALDACTONE) tablet, 50 mg, Oral, Daily  tamsulosin (FLOMAX) capsule, 0.4 mg, Oral, Daily after Dinner        Allergies   Allergen Reactions    Noctec [Chloral Hydrate] Anaphylaxis    Penicillins Anaphylaxis    Theophylline Shortness of Breath    Flexeril [Cyclobenzaprine] Mental Status Effect     Homicidal  ideation    Prednisone Myalgia    Steroids [Steroid] NO Steroids unless approved by Attending Physician       Objective     Vital Signs:  Temp (24hrs) Max:36.9 C (98.5 F)      Systolic (24hrs), Avg:129 , Min:111 , Max:151     Diastolic (24hrs), Avg:85, Min:74, Max:96    Temp  Avg: 36.7 C (98.1 F)  Min: 36.5 C (97.7 F)  Max: 36.9 C (98.5 F)  MAP (Non-Invasive)  Avg: 101.5 mmHG  Min: 86 mmHG  Max: 111 mmHG  Pulse  Avg: 82.4  Min: 73  Max: 90  Resp  Avg: 17.8  Min: 15  Max: 27  SpO2  Avg: 96.5 %  Min: 91 %  Max: 100 %       I/O:  I/O last 24 hours:    Intake/Output Summary (Last 24 hours) at 04/06/2023 0936  Last data filed at 04/06/2023 0759  Gross per 24 hour   Intake 1531.67 ml   Output 1300 ml   Net 231.67 ml     I/O current shift:  10/17 0700 - 10/17 1859  In: 100   Out: -     Physical Exam:  Constitutional:  no distress  Respiratory:  Breathing nonlabored  Cardiovascular:  regular rate and rhythm on the monitor  Gastrointestinal:  Soft, non-tender, Bowel sounds normal    Labs:  No results found for: "ALPHFETOPROT", "CA199X"   CBC  Diff   Lab Results   Component Value Date/Time    WBC 14.7 (H) 04/06/2023 05:02 AM  HGB 12.6 04/06/2023 05:02 AM    HCT 37.1 04/06/2023 05:02 AM    PLTCNT 532 (H) 04/06/2023 05:02 AM    RBC 3.49 (L) 04/06/2023 05:02 AM    MCV 106.5 (H) 04/06/2023 05:02 AM    MCHC 34.0 04/06/2023 05:02 AM    MCH 36.2 (H) 04/06/2023 05:02 AM    RDW 23.5 (H) 04/06/2023 05:02 AM    MPV 7.0 (L) 04/06/2023 05:02 AM    Lab Results   Component Value Date/Time    PMNS 64 04/06/2023 05:02 AM    LYMPHOCYTES 21 04/06/2023 05:02 AM    EOSINOPHIL 3 04/06/2023 05:02 AM    MONOCYTES 12 04/06/2023 05:02 AM    BASOPHILS 1 04/06/2023 05:02 AM    BASOPHILS 0.10 04/06/2023 05:02 AM    PMNABS 9.30 (H) 04/06/2023 05:02 AM    LYMPHSABS 3.10 (H) 04/06/2023 05:02 AM    EOSABS 0.40 04/06/2023 05:02 AM    MONOSABS 1.80 (H) 04/06/2023 05:02 AM           Last BMP  (Last result in the past 24 hours)        Na   K   Cl   CO2    BUN   Cr   Calcium   Glucose   Glucose-Fasting        04/06/23 0502 134   4.9   100   25   4   0.53   9.5   98                  Radiology:    No results found for this or any previous visit (from the past 161096045 hour(s)).  No results found for this or any previous visit (from the past 409811914 hour(s)).  Recent Results (from the past 782956213 hour(s))   CT ABDOMEN PELVIS W IV CONTRAST    Collection Time: 04/01/23  1:08 AM    Narrative    SR. Jos ANDREW Franklin SR.    RADIOLOGIST: Carma Lair    CT ABDOMEN PELVIS W IV CONTRAST performed on 04/01/2023 1:08 AM    CLINICAL HISTORY: diffuse abdominal pain and swelling.  diffuse abdominal pain and swelling, hx. hernia repair    TECHNIQUE:  Abdomen and pelvis CT with intravenous contrast.  IV CONTRAST: 75 ml's of Omnipaque 350    COMPARISON:  12/13/2022 and 10/31/2022    FINDINGS:  Lung bases: Clear    Liver:   Hepatic steatosis. The liver is enlarged.    Gallbladder:   There is probably some layering sludge or stones.    Spleen:   Unremarkable.    Pancreas:   Unremarkable.    Adrenals:   Unremarkable.    Kidneys:   There is a cyst in the right kidney. No hydronephrosis.    Bladder:  The bladder is distended. There is some circumferential bladder wall thickening.  Prostate:  Unremarkable.    Bowel:   There is diffuse wall thickening of the colon most consistent with colitis. This may BE infectious or inflammatory in nature. There is no large or small bowel dilatation.    Appendix:  Normal.    Lymph nodes:  No suspicious lymph node enlargement.    Vasculature:   Mild diffuse atherosclerotic calcifications are noted.     Peritoneum / Retroperitoneum: No ascites.  No free air.    Bones:   Unremarkable.          Impression    Diffuse wall thickening of the colon most  consistent with colitis. This may BE infectious or inflammatory nature. A follow-up colonoscopy is recommended for better evaluation of the colon.    Other findings as above.       One or more dose  reduction techniques were used (e.g., Automated exposure control, adjustment of the mA and/or kV according to patient size, use of iterative reconstruction technique).      Radiologist location ID: [id number]     CT ABDOMEN PELVIS WO IV CONTRAST    Collection Time: 08/22/22  5:44 AM    Narrative    SR. Deniro ANDREW Hildebrandt SR.    RADIOLOGIST: Mallie Mussel    CT ABDOMEN PELVIS WO IV CONTRAST performed on 08/22/2022 5:44 AM    CLINICAL HISTORY: Elevated liver enzymes..  Elevated liver enzymes., sx hx of umbilical hernia repair    TECHNIQUE:  Abdomen and pelvis CT without intravenous contrast.    COMPARISON:  05/31/2022  # of known CTs in the past 12 months:  2   # of known Cardiac Nuclear Medicine Studies in the past 12 months:  0    FINDINGS:  Noncontrast technique limits evaluation of the abdominal and pelvic viscera.    Lung bases: Mild dependent atelectasis    Liver:   Diffuse fatty infiltration.    Gallbladder:   Unremarkable.    Spleen:   Unremarkable.    Pancreas:   Unremarkable.    Adrenals:   Unremarkable.    Kidneys:   Unremarkable.    Bladder:  Mild circumferential wall thickening    Prostate:  Unremarkable.    Bowel:   Colonic diverticulosis without diverticulitis.    Appendix:  Normal.    Lymph nodes:  No suspicious lymph node enlargement.    Vasculature:   Mild diffuse atherosclerotic calcifications are noted.     Peritoneum / Retroperitoneum: No ascites.  No free air.    Bones:   Degenerative changes of the spine. 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  Small bilateral fat-containing inguinal hernias.      Impression    NO ACUTE FINDINGS AT THE ABDOMEN OR PELVIS ON NONCONTRAST CT.    HEPATIC STEATOSIS.       One or more dose reduction techniques were used (e.g., Automated exposure control, adjustment of the mA and/or kV according to patient size, use of iterative reconstruction technique).      Radiologist location ID: [id number]       No  results found for this or any previous visit (from the past 536644034 hour(s)).  No results found for this or any previous visit (from the past 742595638 hour(s)).  Recent Results (from the past 756433295 hour(s))   US ABDOMEN (FOR ASCITES)    Collection Time: 04/04/23  4:20 PM    Narrative    SR. Romie ANDREW Dearcos SR.    RADIOLOGIST: Murray Hodgkins    US ABDOMEN (FOR ASCITES) performed on 04/04/2023 4:20 PM    CLINICAL HISTORY: abdominal distention and pain.  DISTENSION    COMPARISON: None.    FINDINGS:  Ultrasound was used to evaluate the four quadrants of the abdomen for ascites.    Ascites: None        Impression    NO ASCITES        Radiologist location ID: [id number]         Microbiology:  No results found for any visits on 03/31/23 (from the past 24 hour(s)).     Assessment/ Plan:  Ulcerative colitis  E.Coli    Complete antibiotic treatment for enteropathogenic e.coli. Will plan on outpatient colonoscopy in 4 weeks to reassess ulcerative colitis. May be discharged from GI standpoint. Leukocytosis is stable, likely inflammatory. Will need to follow up with Melody in 2 weeks. Discussed with Dr.K.Patel and treatment by him.         Ulyess Blossom, NP-C

## 2023-04-06 NOTE — Nurses Notes (Signed)
To be discharged home with family

## 2023-04-07 ENCOUNTER — Other Ambulatory Visit: Payer: Self-pay

## 2023-04-07 ENCOUNTER — Ambulatory Visit: Payer: Medicaid Other | Attending: Physician Assistant

## 2023-04-07 DIAGNOSIS — E876 Hypokalemia: Secondary | ICD-10-CM | POA: Insufficient documentation

## 2023-04-07 LAB — COMPREHENSIVE METABOLIC PANEL, NON-FASTING
ALBUMIN/GLOBULIN RATIO: 1 (ref 0.8–1.4)
ALBUMIN: 4.6 g/dL (ref 3.5–5.7)
ALKALINE PHOSPHATASE: 145 U/L — ABNORMAL HIGH (ref 34–104)
ALT (SGPT): 38 U/L (ref 7–52)
ANION GAP: 11 mmol/L (ref 4–13)
AST (SGOT): 98 U/L — ABNORMAL HIGH (ref 13–39)
BILIRUBIN TOTAL: 1 mg/dL (ref 0.3–1.0)
BUN/CREA RATIO: 11 (ref 6–22)
BUN: 8 mg/dL (ref 7–25)
CALCIUM, CORRECTED: 9.9 mg/dL (ref 8.9–10.8)
CALCIUM: 10.4 mg/dL — ABNORMAL HIGH (ref 8.6–10.3)
CHLORIDE: 99 mmol/L (ref 98–107)
CO2 TOTAL: 20 mmol/L — ABNORMAL LOW (ref 21–31)
CREATININE: 0.75 mg/dL (ref 0.60–1.30)
ESTIMATED GFR: 114 mL/min/{1.73_m2} (ref >59)
GLOBULIN: 4.4 (ref 2.9–5.4)
GLUCOSE: 113 mg/dL — ABNORMAL HIGH (ref 74–109)
OSMOLALITY, CALCULATED: 260 mosm/kg — ABNORMAL LOW (ref 270–290)
POTASSIUM: 5.2 mmol/L — ABNORMAL HIGH (ref 3.5–5.1)
PROTEIN TOTAL: 9 g/dL — ABNORMAL HIGH (ref 6.4–8.9)
SODIUM: 130 mmol/L — ABNORMAL LOW (ref 136–145)

## 2023-04-14 ENCOUNTER — Telehealth (HOSPITAL_COMMUNITY): Payer: Self-pay

## 2023-05-09 IMAGING — MR MRI LUMBAR SPINE WITHOUT CONTRAST
4 of 6 series · 31 of 48 positions shown · IV contrast (gadolinium)
Comparison: MRI lumbar spine dated 03/04/2022.

﻿EXAM:  [id number]   MRI LUMBAR SPINE WITHOUT CONTRAST
INDICATION: History of trauma due to fall about 1 year ago.  Numbness and tingling in the upper extremity. No history of back surgery or malignancy.
TECHNIQUE: Multiplanar, multisequential MRI of the lumbosacral spine was performed without gadolinium contrast.

[Series 5: T2 · sagittal · 4.5mm · 0.94mm/px · 8 of 13 slices shown (1 of 3)]
[im 1/13]
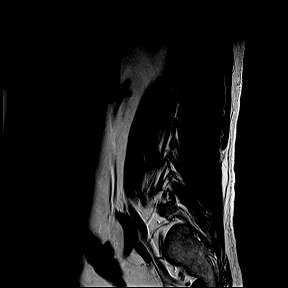
[im 2/13]
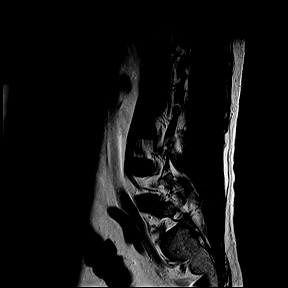
[im 4/13]
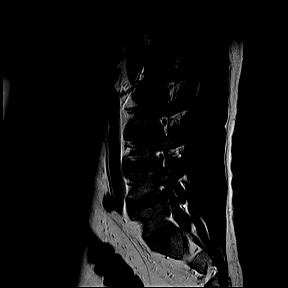
[im 6/13]
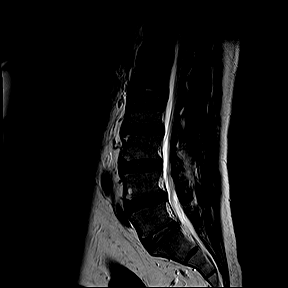
[im 7/13]
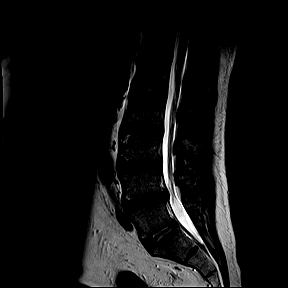
[im 9/13]
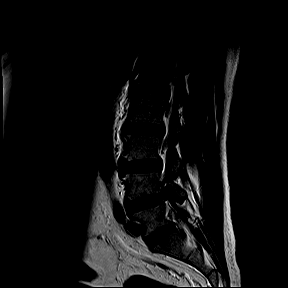
[im 11/13]
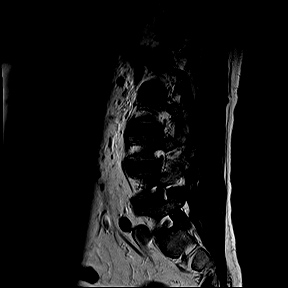
[im 13/13]
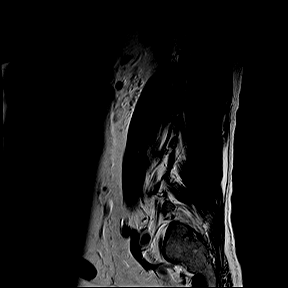

[Series 6: T1 · sagittal · 4.5mm · 0.94mm/px · 7 of 13 slices shown]
[im 1/13]
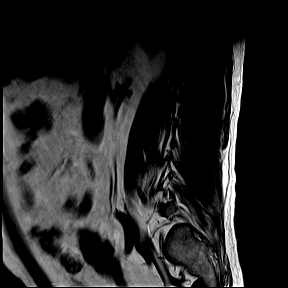
[im 3/13]
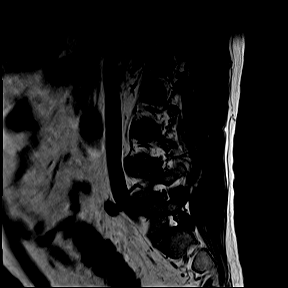
[im 5/13]
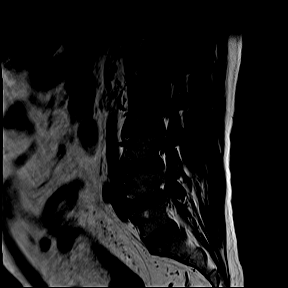
[im 7/13]
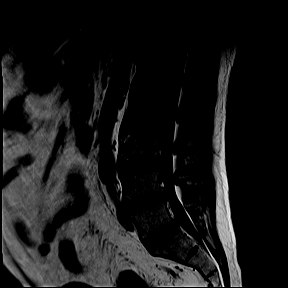
[im 9/13]
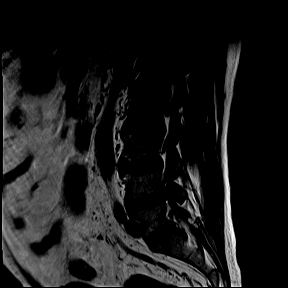
[im 11/13]
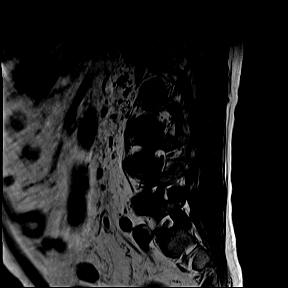
[im 13/13]
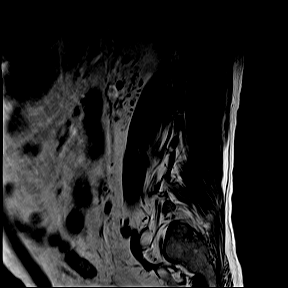

[Series 8: T2 · coronal · 5.0mm · 0.82mm/px · 8 of 18 slices shown (2 of 3)]
[im 1/18]
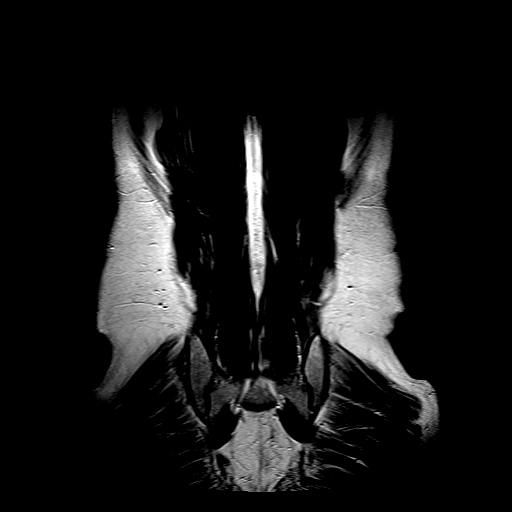
[im 2/18]
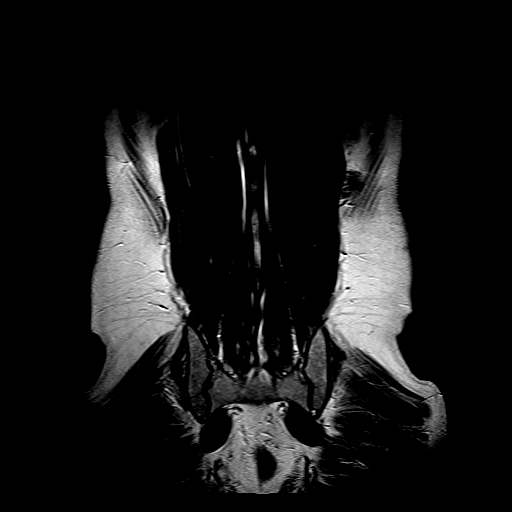
[im 6/18]
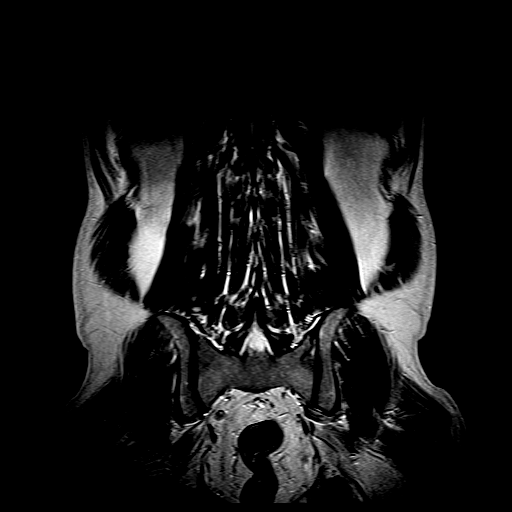
[im 8/18]
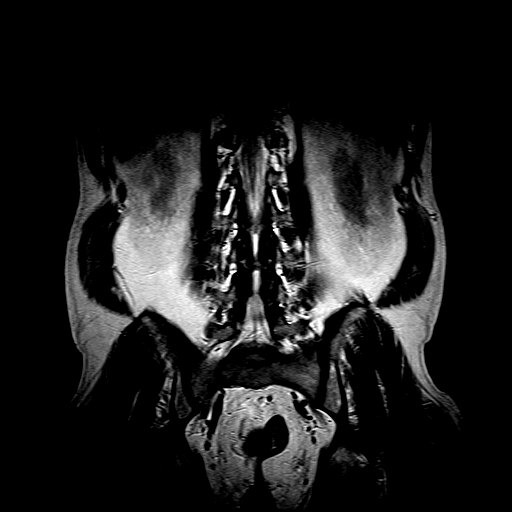
[im 10/18]
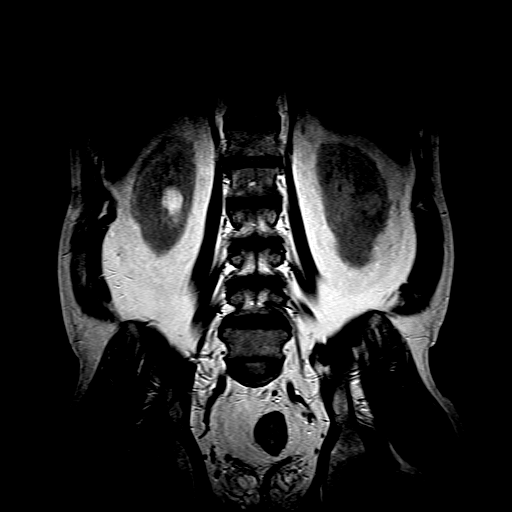
[im 12/18]
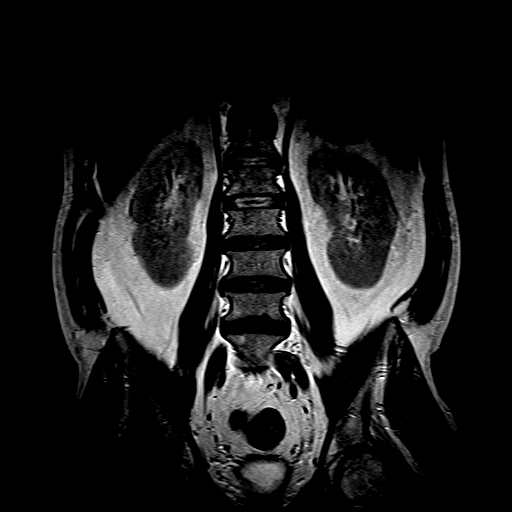
[im 16/18]
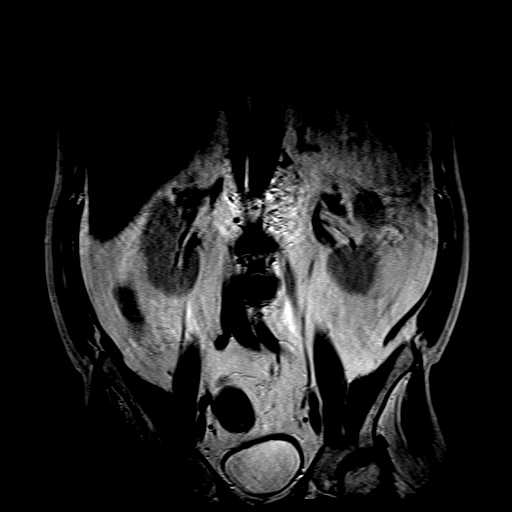
[im 18/18]
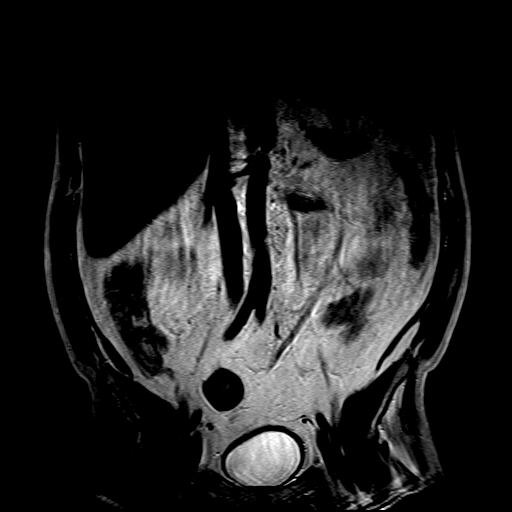

[Series 9: T2 · axial · 4.0mm · 0.52mm/px · z∈[-54,+176]mm · 8 of 15 slices shown (3 of 3)]
[im 1/15]
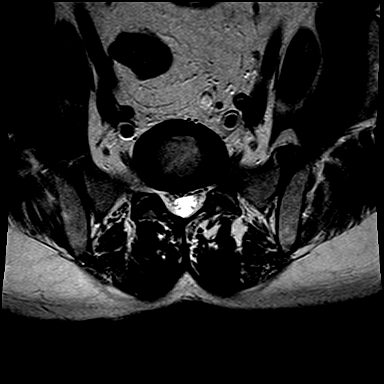
[im 3/15]
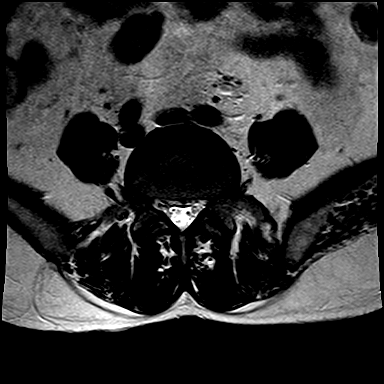
[im 5/15]
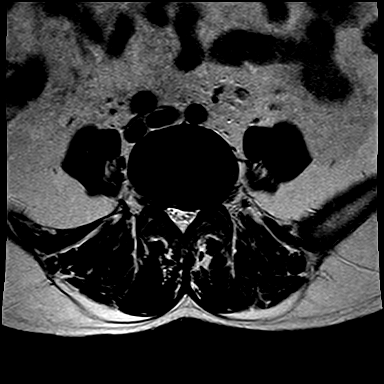
[im 7/15]
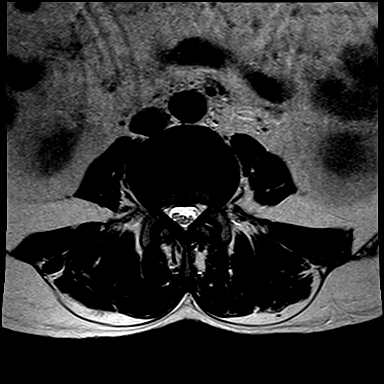
[im 9/15]
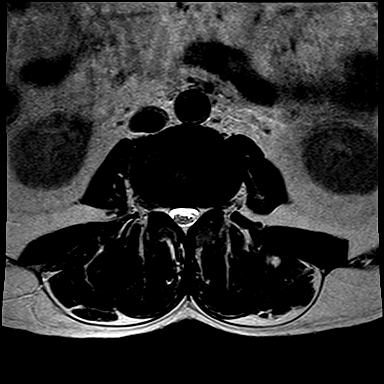
[im 11/15]
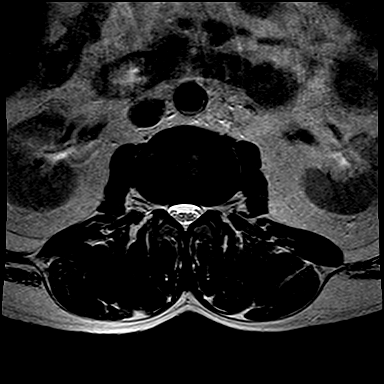
[im 13/15]
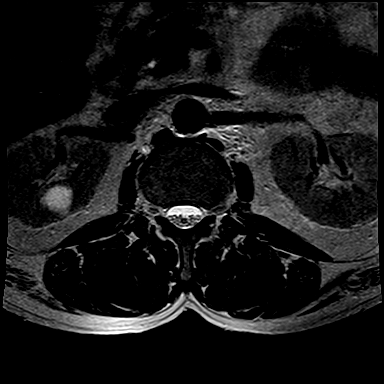
[im 15/15]
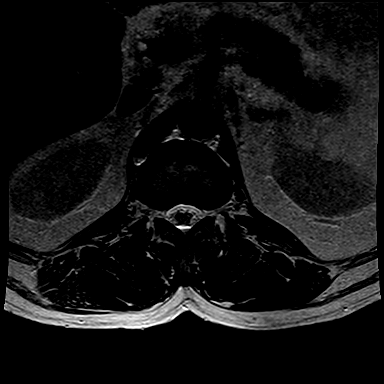

[31 of 48 positions shown; findings below may reference images not displayed]

FINDINGS: No acute bony lesions of lumbar vertebrae are seen.  The conus terminates at L1 level.

At L1-2 level, facet arthropathy is noted causing minimal compromise of both lateral recess.  No change from prior study. 

At L2-3 level, degenerative disc disease with bulging annulus and facet arthropathy are causing moderate compromise of both lateral recess and mild compromise of thecal sac with AP diameter in the midline measuring 9 mm. Finding at this level is unchanged.

At L3-4 level, degenerative disc disease and facet arthropathy are noted causing moderate compromise of both lateral recess and neural foramina and mild compromise of thecal sac with AP diameter in the midline measuring 9 mm, unchanged from previous study.

At L4-5 level, severe degenerative disc disease with asymmetric bulging annulus to the left is noted along with facet arthropathy causing severe compromise of left lateral recess and left neural foramen.  Significant compromise of thecal sac in the midline measuring 7.1 mm in AP diameter.  Finding at this level is unchanged from 03/04/2022. 

At L5-S1 level, no focal disc lesions are seen. 

Paravertebral soft tissues are unremarkable.  Signal abnormality of the subarticular aspect of femoral head at the right hip is noted as was noted on previous study.  Possible avascular necrosis.
IMPRESSION: 1. No acute bone changes of lumbar vertebrae. 

2. At L4-5 level, severe degenerative disc disease with asymmetric bulging annulus to the left is noted along with facet arthropathy causing severe compromise of left lateral recess and left neural foramen.  Significant compromise of thecal sac in the midline measuring 7.1 mm in AP diameter.  Finding at this level is unchanged from 03/04/2022. 

3. Findings at other disc levels are described above in detail.  Overall findings are unchanged from findings noted on 03/04/2022. 

4. Abnormal signal in the subarticular aspect of the femoral head at the right hip suggested, suspicious of AVN.  If patient has symptoms in the right hip area, additional evaluation with MRI is recommended.

Electronically Signed by MANZUETA, HOMMY ZAEL at [id number] [DATE]

## 2023-07-24 ENCOUNTER — Emergency Department
Admission: EM | Admit: 2023-07-24 | Discharge: 2023-07-24 | Disposition: A | Discharge status: Home / Self Care | Payer: Medicaid Other

## 2023-07-24 ENCOUNTER — Encounter (HOSPITAL_COMMUNITY): Payer: Self-pay

## 2023-07-24 ENCOUNTER — Other Ambulatory Visit: Payer: Self-pay

## 2023-07-24 ENCOUNTER — Emergency Department (HOSPITAL_COMMUNITY): Payer: Medicaid Other

## 2023-07-24 DIAGNOSIS — K529 Noninfective gastroenteritis and colitis, unspecified: Secondary | ICD-10-CM | POA: Insufficient documentation

## 2023-07-24 DIAGNOSIS — K5190 Ulcerative colitis, unspecified, without complications: Secondary | ICD-10-CM | POA: Insufficient documentation

## 2023-07-24 LAB — COMPREHENSIVE METABOLIC PANEL, NON-FASTING
ALBUMIN/GLOBULIN RATIO: 1.1 (ref 0.8–1.4)
ALBUMIN: 4.3 g/dL (ref 3.5–5.7)
ALKALINE PHOSPHATASE: 142 U/L — ABNORMAL HIGH (ref 34–104)
ALT (SGPT): 55 U/L — ABNORMAL HIGH (ref 7–52)
ANION GAP: 14 mmol/L — ABNORMAL HIGH (ref 4–13)
AST (SGOT): 42 U/L — ABNORMAL HIGH (ref 13–39)
BILIRUBIN TOTAL: 0.9 mg/dL (ref 0.3–1.0)
BUN/CREA RATIO: 17 (ref 6–22)
BUN: 13 mg/dL (ref 7–25)
CALCIUM, CORRECTED: 9.6 mg/dL (ref 8.9–10.8)
CALCIUM: 9.8 mg/dL (ref 8.6–10.3)
CHLORIDE: 95 mmol/L — ABNORMAL LOW (ref 98–107)
CO2 TOTAL: 25 mmol/L (ref 21–31)
CREATININE: 0.77 mg/dL (ref 0.60–1.30)
ESTIMATED GFR: 113 mL/min/{1.73_m2} (ref >59)
GLOBULIN: 3.9 — ABNORMAL HIGH (ref 2.0–3.5)
GLUCOSE: 129 mg/dL — ABNORMAL HIGH (ref 74–109)
OSMOLALITY, CALCULATED: 270 mosm/kg (ref 270–290)
POTASSIUM: 3.5 mmol/L (ref 3.5–5.1)
PROTEIN TOTAL: 8.2 g/dL (ref 6.4–8.9)
SODIUM: 134 mmol/L — ABNORMAL LOW (ref 136–145)

## 2023-07-24 LAB — URINALYSIS, MACROSCOPIC
BILIRUBIN: 0.5 mg/dL
BLOOD: NEGATIVE mg/dL
GLUCOSE: NEGATIVE mg/dL
LEUKOCYTES: NEGATIVE WBCs/uL
NITRITE: NEGATIVE
PH: 5.5 (ref 5.0–9.0)
PROTEIN: 70 mg/dL — AB
SPECIFIC GRAVITY: 1.029 (ref 1.002–1.030)
UROBILINOGEN: 3 mg/dL — AB

## 2023-07-24 LAB — CBC WITH DIFF
BASOPHIL #: 0 10*3/uL (ref 0.00–0.10)
BASOPHIL %: 0 % (ref 0–1)
EOSINOPHIL #: 0.2 10*3/uL (ref 0.00–0.50)
EOSINOPHIL %: 2 % (ref 1–8)
HCT: 49.8 % — ABNORMAL HIGH (ref 36.7–47.1)
HGB: 16.6 g/dL — ABNORMAL HIGH (ref 12.5–16.3)
LYMPHOCYTE #: 1.4 10*3/uL (ref 1.00–3.20)
LYMPHOCYTE %: 11 % — ABNORMAL LOW (ref 15–43)
MCH: 29.8 pg (ref 23.8–33.4)
MCHC: 33.3 g/dL (ref 32.5–36.3)
MCV: 89.5 fL (ref 73.0–96.2)
MONOCYTE #: 0.9 10*3/uL (ref 0.30–1.10)
MONOCYTE %: 7 % (ref 6–14)
MPV: 7.4 fL (ref 7.4–11.4)
NEUTROPHIL #: 10.1 10*3/uL — ABNORMAL HIGH (ref 1.70–7.60)
NEUTROPHIL %: 81 % — ABNORMAL HIGH (ref 44–74)
PLATELETS: 475 10*3/uL — ABNORMAL HIGH (ref 140–440)
RBC: 5.56 10*6/uL (ref 4.06–5.63)
RDW: 13.4 % (ref 12.1–16.2)
WBC: 12.6 10*3/uL — ABNORMAL HIGH (ref 3.6–10.2)

## 2023-07-24 LAB — BLUE TOP TUBE

## 2023-07-24 LAB — URINALYSIS, MICROSCOPIC
HYALINE CASTS: 14 /[LPF] — ABNORMAL HIGH (ref <0)
RBCS: 4 /[HPF] — ABNORMAL HIGH (ref <4)
SQUAMOUS EPITHELIAL: 1 /[HPF] (ref <28)
WBCS: 4 /[HPF] (ref <6)

## 2023-07-24 LAB — C-REACTIVE PROTEIN (CRP): C-REACTIVE PROTEIN (CRP): 5.6 mg/dL — ABNORMAL HIGH (ref 0.1–0.5)

## 2023-07-24 LAB — GRAY TOP TUBE

## 2023-07-24 LAB — LIPASE: LIPASE: 8 U/L — ABNORMAL LOW (ref 11–82)

## 2023-07-24 MED ORDER — SODIUM CHLORIDE 0.9 % IV BOLUS
1000.0000 mL | INJECTION | Status: AC
Start: 2023-07-24 — End: 2023-07-24
  Administered 2023-07-24: 1000 mL via INTRAVENOUS
  Administered 2023-07-24: 0 mL via INTRAVENOUS

## 2023-07-24 MED ORDER — ONDANSETRON 4 MG DISINTEGRATING TABLET: 4.0000 mg | ORAL_TABLET | Freq: Three times a day (TID) | ORAL | 0 refills | Status: DC | PRN

## 2023-07-24 MED ORDER — IPRATROPIUM 0.5 MG-ALBUTEROL 3 MG (2.5 MG BASE)/3 ML NEBULIZATION SOLN
3.0000 mL | INHALATION_SOLUTION | RESPIRATORY_TRACT | Status: AC
Start: 2023-07-24 — End: 2023-07-24
  Administered 2023-07-24: 3 mL via RESPIRATORY_TRACT

## 2023-07-24 MED ORDER — IOHEXOL 350 MG IODINE/ML INTRAVENOUS SOLUTION
50.0000 mL | INTRAVENOUS | Status: AC
Start: 2023-07-24 — End: 2023-07-24
  Administered 2023-07-24: 80 mL via INTRAVENOUS

## 2023-07-24 MED ORDER — METRONIDAZOLE 250 MG TABLET
500.0000 mg | ORAL_TABLET | ORAL | Status: AC
Start: 2023-07-24 — End: 2023-07-24
  Administered 2023-07-24: 500 mg via ORAL

## 2023-07-24 MED ORDER — METRONIDAZOLE 250 MG TABLET
ORAL_TABLET | ORAL | Status: AC
Start: 2023-07-24 — End: 2023-07-24
  Filled 2023-07-24: qty 2

## 2023-07-24 MED ORDER — DICYCLOMINE 10 MG CAPSULE
20.0000 mg | ORAL_CAPSULE | ORAL | Status: AC
Start: 2023-07-24 — End: 2023-07-24
  Administered 2023-07-24: 20 mg via ORAL

## 2023-07-24 MED ORDER — LEVOFLOXACIN 500 MG TABLET
ORAL_TABLET | ORAL | Status: AC
Start: 2023-07-24 — End: 2023-07-24
  Filled 2023-07-24: qty 1

## 2023-07-24 MED ORDER — LEVOFLOXACIN 500 MG TABLET
500.0000 mg | ORAL_TABLET | ORAL | Status: AC
Start: 2023-07-24 — End: 2023-07-24
  Administered 2023-07-24: 500 mg via ORAL

## 2023-07-24 MED ORDER — LEVOFLOXACIN 500 MG TABLET
500.0000 mg | ORAL_TABLET | ORAL | 0 refills | Status: AC
Start: 2023-07-24 — End: 2023-07-31

## 2023-07-24 MED ORDER — DICYCLOMINE 10 MG CAPSULE
ORAL_CAPSULE | ORAL | Status: AC
Start: 2023-07-24 — End: 2023-07-24
  Filled 2023-07-24: qty 2

## 2023-07-24 MED ORDER — METRONIDAZOLE 500 MG TABLET
500.0000 mg | ORAL_TABLET | Freq: Three times a day (TID) | ORAL | 0 refills | Status: AC
Start: 2023-07-24 — End: 2023-07-31

## 2023-07-24 NOTE — Discharge Instructions (Signed)
Continue all home medications.  Please take medications as prescribed.  Please follow up with Dr. Concha Se in the morning.  Take Zofran as needed for nausea.  Return to the ER for new or worsening symptoms.

## 2023-07-24 NOTE — ED Nurses Note (Signed)
Patients ride arrived at this time. Patient left the department ambulatory with cane without difficulty.

## 2023-07-24 NOTE — ED Triage Notes (Signed)
Abd pain since yesterday, NV, HX of crohns/UC  PRS: Zofran 4mg  OTC

## 2023-07-24 NOTE — ED Provider Notes (Signed)
[facility]  ED Primary Provider Note      Name: [name] [facility]  Age and Gender: 48 y.o. male  Date of Birth: September 03, 1975  MRN: [id number]  PCP: George Milling, DO    CC:  Chief Complaint   Patient presents with    Abdominal Pain    Nausea       HPI:  [name] is a 48 y.o. White male who presents to the ER with abdominal pain and nausea.  Patient states that he has a history of UC, yesterday when he was picking up his kids he noticed some abdominal cramping, which then turned into pain and he had multiple episodes of vomiting.  Patient reports that he was vomiting bile at home.  Patient states that he has been taking all of his UC medications.  Denies any other sick contacts.  Reports it has been able to tolerate oral fluids today without vomiting.    Below pertinent information reviewed with patient:  Past Medical History:   Diagnosis Date    Asthma     Bleeding hemorrhoid     Bleeding ulcer     Cervical herniated disc     COPD (chronic obstructive pulmonary disease) (CMS HCC)     GERD (gastroesophageal reflux disease)     Hx MRSA infection     Hyperlipidemia     Hypertension     Pre-diabetes     Rotator cuff arthropathy, right     Ulcerative colitis (CMS HCC)        Allergies   Allergen Reactions    Noctec [Chloral Hydrate] Anaphylaxis    Penicillins Anaphylaxis    Theophylline Shortness of Breath    Flexeril [Cyclobenzaprine] Mental Status Effect     Homicidal ideation    Prednisone Myalgia    Steroids [Steroid] NO Steroids unless approved by Attending Physician       Past Surgical History:   Procedure Laterality Date    EGD WITH BIOPSY N/A 09/03/2021    Performed by [employer]eds, MD at [facility]    HX CYST REMOVAL      lower back    HX HERNIA REPAIR      HX TONSILLECTOMY      SIGMOIDOSCOPY FLEXIBLE to 30 cm N/A 04/05/2023    Performed by [employer], MD at [facility]        Social History     Socioeconomic History    Marital status: Married   Tobacco Use     Smoking status: Every Day     Current packs/day: 1.00     Average packs/day: 1 pack/day for 30.0 years (30.0 ttl pk-yrs)     Types: Cigarettes    Smokeless tobacco: Never   Vaping Use    Vaping status: Never Used   Substance and Sexual Activity    Alcohol use: Yes     Alcohol/week: 8.0 standard drinks of alcohol     Types: 8 Cans of beer per week     Comment: occasional    Drug use: Never     Social Determinants of Health     Financial Resource Strain: Low Risk  (11/11/2021)    Financial Resource Strain     SDOH Financial: No   Transportation Needs: Low Risk  (11/11/2021)    Transportation Needs     SDOH Transportation: No   Social Connections: Low Risk  (04/01/2023)    Social Connections     SDOH Social  Isolation: 5 or more times a week   Intimate Partner Violence: Low Risk  (11/11/2021)    Intimate Partner Violence     SDOH Domestic Violence: No   Housing Stability: Low Risk  (11/11/2021)    Housing Stability     SDOH Housing Situation: I have housing.     SDOH Housing Worry: No       ROS:  No other overt positive review of systems are noted other than stated in the HPI.      Objective:    ED Triage Vitals [07/24/23 1346]   BP (Non-Invasive) 112/79   Heart Rate 99   Respiratory Rate 18   Temperature 36.4 C (97.6 F)   SpO2 98 %   Weight 79.8 kg (176 lb)   Height 1.676 m (5\' 6" )     Filed Vitals:    07/24/23 1900 07/24/23 1945 07/24/23 2000 07/24/23 2005   BP: 118/84 (!) 136/91 (!) 138/92    Pulse: 93 89 82    Resp: 14 16 13     Temp:    36.8 C (98.2 F)   SpO2: 96% 95% 97%        Nursing notes and vital signs reviewed.    Constitutional - No acute distress.  Alert and Active.  HEENT - Normocephalic. Atraumatic. PERRL. EOMI. Conjunctiva clear. Moist mucous membranes.   Neck - Trachea midline. No stridor. No hoarseness.  Cardiac - Regular rate and rhythm. No murmurs, rubs, or gallops. Intact distal pulses.  Respiratory/Chest - Normal respiratory effort. Clear to auscultation bilaterally. No rales, wheezes or rhonchi.  No chest tenderness.  Abdomen - Normal bowel sounds.  Distended abdomen.  Musculoskeletal - Good AROM. No muscle or joint tenderness appreciated. No clubbing, cyanosis or edema.  Skin - Warm and dry, without any rashes or other lesions.  Neuro - Alert and oriented x 3. Cranial nerves II-XII are grossly intact.  Moving all extremities symmetrically. Normal gait.  Psych - Normal mood and affect. Behavior is normal        Any pertinent labs and imaging obtained during this encounter reviewed below in MDM.    MDM/ED Course:      Medical Decision Making  Patient  presents to the ER with abdominal pain and nausea.  Patient states that he has a history of UC, yesterday when he was picking up his kids he noticed some abdominal cramping, which then turned into pain and he had multiple episodes of vomiting.  Patient reports that he was vomiting bile at home.  Patient states that he has been taking all of his UC medications.  Denies any other sick contacts.  Reports it has been able to tolerate oral fluids today without vomiting.    Differential diagnoses include:  Colitis, ulcerative colitis exacerbation, enteritis, gastroenteritis, gastritis, sepsis    Patient underwent diagnostics with results as noted in ED course.    Patient was found/suspected to have gastroenteritis and colitis.  CBC did show mildly elevated white blood cell count of 12.6, CMP showed stable liver function test.  CRP was mildly elevated 5.6, CT abdomen and pelvis showed inflammatory wall thickening which correlated with ulcerative colitis along with enteritis.  Patient did receive normal saline 1 L, DuoNeb, Bentyl and 1 dose of Levaquin and Flagyl with relief of abdominal cramping.  Spoke with [name], he reports he can see the patient in the morning.  We will send in Levaquin and Flagyl for admission to take at home.  Strict ER  return precautions given.  Patient will follow up with [name] in the morning and PCP in the next 5-7 days.  Patient  agreeable to treatment course.  All questions answered.    Patient will be discharged in stable condition at this time.    Amount and/or Complexity of Data Reviewed  Radiology: ordered. Decision-making details documented in ED Course.  ECG/medicine tests: independent interpretation performed.    Risk  Prescription drug management.        ED Course as of 07/24/23 2040   Mon Jul 24, 2023   1415 CBC/DIFF(!)  RBCs 12.6, hemoglobin 16.6, hematocrit 49.8   1500 COMPREHENSIVE METABOLIC PANEL, NON-FASTING(!)  K 3.5, BUN 13, creatinine 0.77, alk-phos 142, ALT 55, AST 42, stable   1500 LIPASE(!): 8   1500 C-REACTIVE PROTEIN (CRP)(!): 5.6   1810 CT ABDOMEN PELVIS W IV CONTRAST  Probable inflammatory wall thickening of the colon and rectum may relate to the patient's underlying diagnosis of ulcerative colitis     No definite ileitis at this time  Nonspecific small bowel appearance could indicate enteritis related to either inflammatory or infectious disease  No ascites or free air  Incidental findings as detailed above     1835 XR AP MOBILE CHEST     Heart:  The heart and mediastinum are stable.  Lungs:  The lungs are stable.  No pleural fluid.     IMPRESSION:  NO ACUTE FINDINGS.     1900 URINALYSIS, MACROSCOPIC AND MICROSCOPIC W/CULTURE REFLEX(!)  Unremarkable       Orders Placed This Encounter    CT ABDOMEN PELVIS W IV CONTRAST    XR AP MOBILE CHEST    CBC/DIFF    COMPREHENSIVE METABOLIC PANEL, NON-FASTING    C-REACTIVE PROTEIN (CRP)    LIPASE    URINALYSIS, MACROSCOPIC AND MICROSCOPIC W/CULTURE REFLEX    CBC WITH DIFF    URINALYSIS, MACROSCOPIC    URINALYSIS, MICROSCOPIC    EXTRA TUBES    BLUE TOP TUBE    GRAY TOP TUBE    INSERT & MAINTAIN PERIPHERAL IV ACCESS    iohexol (OMNIPAQUE 350) infusion    NS bolus infusion 1,000 mL    ipratropium-albuterol 0.5 mg-3 mg(2.5 mg base)/3 mL Solution for Nebulization    dicyclomine (BENTYL) capsule    metroNIDAZOLE (FLAGYL) 500 mg Oral Tablet    levoFLOXacin (LEVAQUIN) 500 mg Oral Tablet     ondansetron (ZOFRAN ODT) 4 mg Oral Tablet, Rapid Dissolve    metroNIDAZOLE (FLAGYL) tablet    levoFLOXacin (LEVAQUIN) tablet     Impression:   Clinical Impression   Gastroenteritis (Primary)   Colitis       Disposition: Discharged    / M. Toney Sang, APRN, FNP-C 07/24/2023, 18:20  [facility]  Department of Emergency Medicine  [facility]    Portions of this note may have been dictated using voice recognition software.     -----------------------  Results for orders placed or performed during the hospital encounter of 07/24/23 (from the past 12 hour(s))   COMPREHENSIVE METABOLIC PANEL, NON-FASTING   Result Value Ref Range    SODIUM 134 (L) 136 - 145 mmol/L    POTASSIUM 3.5 3.5 - 5.1 mmol/L    CHLORIDE 95 (L) 98 - 107 mmol/L    CO2 TOTAL 25 21 - 31 mmol/L    ANION GAP 14 (H) 4 - 13 mmol/L    BUN 13 7 - 25 mg/dL    CREATININE 4.01 0.27 -  1.30 mg/dL    BUN/CREA RATIO 17 6 - 22    ESTIMATED GFR 113 >59 mL/min/1.42m^2    ALBUMIN 4.3 3.5 - 5.7 g/dL    CALCIUM 9.8 8.6 - 47.8 mg/dL    GLUCOSE 295 (H) 74 - 109 mg/dL    ALKALINE PHOSPHATASE 142 (H) 34 - 104 U/L    ALT (SGPT) 55 (H) 7 - 52 U/L    AST (SGOT) 42 (H) 13 - 39 U/L    BILIRUBIN TOTAL 0.9 0.3 - 1.0 mg/dL    PROTEIN TOTAL 8.2 6.4 - 8.9 g/dL    ALBUMIN/GLOBULIN RATIO 1.1 0.8 - 1.4    OSMOLALITY, CALCULATED 270 270 - 290 mOsm/kg    CALCIUM, CORRECTED 9.6 8.9 - 10.8 mg/dL    GLOBULIN 3.9 (H) 2.0 - 3.5   C-REACTIVE PROTEIN (CRP)   Result Value Ref Range    C-REACTIVE PROTEIN (CRP) 5.6 (H) 0.1 - 0.5 mg/dL   LIPASE   Result Value Ref Range    LIPASE 8 (L) 11 - 82 U/L   CBC WITH DIFF   Result Value Ref Range    WBC 12.6 (H) 3.6 - 10.2 x10^3/uL    RBC 5.56 4.06 - 5.63 x10^6/uL    HGB 16.6 (H) 12.5 - 16.3 g/dL    HCT 62.1 (H) 30.8 - 47.1 %    MCV 89.5 73.0 - 96.2 fL    MCH 29.8 23.8 - 33.4 pg    MCHC 33.3 32.5 - 36.3 g/dL    RDW 65.7 84.6 - 96.2 %    PLATELETS 475 (H) 140 - 440 x10^3/uL    MPV 7.4 7.4 - 11.4 fL    NEUTROPHIL % 81 (H) 44 - 74 %     LYMPHOCYTE % 11 (L) 15 - 43 %    MONOCYTE % 7 6 - 14 %    EOSINOPHIL % 2 1 - 8 %    BASOPHIL % 0 0 - 1 %    NEUTROPHIL # 10.10 (H) 1.70 - 7.60 x10^3/uL    LYMPHOCYTE # 1.40 1.00 - 3.20 x10^3/uL    MONOCYTE # 0.90 0.30 - 1.10 x10^3/uL    EOSINOPHIL # 0.20 0.00 - 0.50 x10^3/uL    BASOPHIL # 0.00 0.00 - 0.10 x10^3/uL   BLUE TOP TUBE   Result Value Ref Range    RAINBOW/EXTRA TUBE AUTO RESULT Yes    GRAY TOP TUBE   Result Value Ref Range    RAINBOW/EXTRA TUBE AUTO RESULT Yes    URINALYSIS, MACROSCOPIC   Result Value Ref Range    COLOR Orange (A) Colorless, Light Yellow, Yellow    APPEARANCE Turbid (A) Clear    SPECIFIC GRAVITY 1.029 1.002 - 1.030    PH 5.5 5.0 - 9.0    LEUKOCYTES Negative Negative, 100  WBCs/uL    NITRITE Negative Negative    PROTEIN 70 (A) Negative, 10 , 20  mg/dL    GLUCOSE Negative Negative, 30  mg/dL    KETONES Trace Negative, Trace mg/dL    BILIRUBIN 0.5 Negative, 0.5 mg/dL    BLOOD Negative Negative, 0.03 mg/dL    UROBILINOGEN 3 (A) Normal mg/dL   URINALYSIS, MICROSCOPIC   Result Value Ref Range    MUCOUS Many (A) Rare, Occasional, Few /hpf    SPERMATOZOA URINE Few (A) (none) /hpf    RBCS 4 (H) <4 /hpf    WBCS 4 <6 /hpf    HYALINE CASTS 14 (H) <0 /lpf    SQUAMOUS EPITHELIAL 1 <  28 /hpf     XR AP MOBILE CHEST   Final Result   NO ACUTE FINDINGS.            Radiologist location ID: [id number]         CT ABDOMEN PELVIS W IV CONTRAST   Final Result   Probable inflammatory wall thickening of the colon and rectum may relate to the patient's underlying diagnosis of ulcerative colitis      No definite ileitis at this time   Nonspecific small bowel appearance could indicate enteritis related to either inflammatory or infectious disease   No ascites or free air   Incidental findings as detailed above         One or more dose reduction techniques were used (e.g., Automated exposure control, adjustment of the mA and/or kV according to patient size, use of iterative reconstruction technique).          Radiologist location ID: [id number]

## 2023-07-25 ENCOUNTER — Telehealth (HOSPITAL_COMMUNITY): Payer: Self-pay

## 2023-07-25 NOTE — ED Notes (Signed)
 First attempt to follow up with patient.

## 2023-08-02 NOTE — ED Notes (Signed)
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 NIAAA safe alcohol guidelines, harm reduction and long term use shared with pateint.    Prior treatment history?: No    Currently enrolled in substance use program?: No    Within the last 30 days, what substances has the patient used?: Alcohol  Patient's age at first substance use?: [age]  Drug route of administration: Oral    Has the patient ever had sustained abstinence?: No              Family, Social, Home & Safety History  Marital Status: Married            Need to improve relationships with family?: No    Social network: Immediate family, Substance using peers, Non-substance using peers/friends/other    Current living situation: Independent  Any help needed with the following?: None  Contact phone number for the patient: [phone]       Has the patient had any legal issues within the past 30 days?: None         Employment  Current employment status: Other    Needs vocational training?: No  Needs assistance with job search?: No    Engagement  Readiness ruler: 1  Summary of assessment priority areas: Other  Summary of assessment priority areas: comments: Patient drinks 8-12 beers per week, 1-2 a day and does not feel this is an issue. NIAAA safe alcohol guidelines, harm reduction and long term use shared with pateint.    Brief Intervention  Discussed plan to reduce/quit substance use?: Yes  Discussed willingness to enter treatment?: No  Indicated  patient's stage of change:: 1 - Precontemplation    Patient seen by Peer Recovery Coach and is a candidate for buprenorphine administration in the ED. Patient needs assessment for bup treatment.: No    Plan  Was the patient referred to treatment?: No    Was patient referred to physician for Buprenorphine Assessment in the ED?: No    Did patient receive Narcan in the ED?: No    Plan: Additional Comments: Patient drinks 8-12 beers per week, 1-2 a day and does not feel this is an issue. NIAAA safe alcohol guidelines, harm reduction and long term use shared with pateint.    Follow-up           Need for additional follow-up?: No       Harle Battiest, Peer Recovery Coach 08/02/2023 11:29

## 2023-09-29 ENCOUNTER — Other Ambulatory Visit (HOSPITAL_COMMUNITY): Payer: Self-pay | Admitting: Family Medicine

## 2023-09-29 DIAGNOSIS — R17 Unspecified jaundice: Secondary | ICD-10-CM

## 2023-10-03 ENCOUNTER — Encounter (HOSPITAL_COMMUNITY): Payer: Self-pay

## 2023-10-03 ENCOUNTER — Other Ambulatory Visit (HOSPITAL_COMMUNITY): Payer: Self-pay | Admitting: Family Medicine

## 2023-10-03 DIAGNOSIS — R17 Unspecified jaundice: Secondary | ICD-10-CM

## 2023-10-04 ENCOUNTER — Encounter (HOSPITAL_COMMUNITY): Payer: Self-pay

## 2023-10-05 ENCOUNTER — Emergency Department (HOSPITAL_COMMUNITY)

## 2023-10-05 ENCOUNTER — Inpatient Hospital Stay (HOSPITAL_COMMUNITY): Admitting: Internal Medicine

## 2023-10-05 ENCOUNTER — Other Ambulatory Visit: Payer: Self-pay

## 2023-10-05 ENCOUNTER — Encounter (HOSPITAL_COMMUNITY): Payer: Self-pay

## 2023-10-05 ENCOUNTER — Inpatient Hospital Stay
Admission: EM | Admit: 2023-10-05 | Discharge: 2023-10-10 | DRG: 641 | Disposition: A | Discharge status: Home / Self Care | Attending: HOSPITALIST | Admitting: HOSPITALIST

## 2023-10-05 DIAGNOSIS — G8929 Other chronic pain: Secondary | ICD-10-CM | POA: Diagnosis present

## 2023-10-05 DIAGNOSIS — F1010 Alcohol abuse, uncomplicated: Secondary | ICD-10-CM

## 2023-10-05 DIAGNOSIS — E860 Dehydration: Secondary | ICD-10-CM | POA: Diagnosis present

## 2023-10-05 DIAGNOSIS — Z8249 Family history of ischemic heart disease and other diseases of the circulatory system: Secondary | ICD-10-CM

## 2023-10-05 DIAGNOSIS — K5190 Ulcerative colitis, unspecified, without complications: Secondary | ICD-10-CM

## 2023-10-05 DIAGNOSIS — E872 Acidosis, unspecified: Secondary | ICD-10-CM | POA: Diagnosis present

## 2023-10-05 DIAGNOSIS — E119 Type 2 diabetes mellitus without complications: Secondary | ICD-10-CM

## 2023-10-05 DIAGNOSIS — K760 Fatty (change of) liver, not elsewhere classified: Secondary | ICD-10-CM | POA: Diagnosis present

## 2023-10-05 DIAGNOSIS — K2270 Barrett's esophagus without dysplasia: Secondary | ICD-10-CM | POA: Diagnosis present

## 2023-10-05 DIAGNOSIS — Z7951 Long term (current) use of inhaled steroids: Secondary | ICD-10-CM

## 2023-10-05 DIAGNOSIS — E1142 Type 2 diabetes mellitus with diabetic polyneuropathy: Secondary | ICD-10-CM

## 2023-10-05 DIAGNOSIS — Z515 Encounter for palliative care: Secondary | ICD-10-CM

## 2023-10-05 DIAGNOSIS — F17210 Nicotine dependence, cigarettes, uncomplicated: Secondary | ICD-10-CM

## 2023-10-05 DIAGNOSIS — E785 Hyperlipidemia, unspecified: Secondary | ICD-10-CM | POA: Diagnosis present

## 2023-10-05 DIAGNOSIS — I10 Essential (primary) hypertension: Secondary | ICD-10-CM

## 2023-10-05 DIAGNOSIS — K219 Gastro-esophageal reflux disease without esophagitis: Secondary | ICD-10-CM | POA: Diagnosis present

## 2023-10-05 DIAGNOSIS — J4489 Other specified chronic obstructive pulmonary disease: Secondary | ICD-10-CM | POA: Diagnosis present

## 2023-10-05 DIAGNOSIS — M87851 Other osteonecrosis, right femur: Secondary | ICD-10-CM | POA: Diagnosis present

## 2023-10-05 DIAGNOSIS — K7460 Unspecified cirrhosis of liver: Secondary | ICD-10-CM

## 2023-10-05 DIAGNOSIS — J449 Chronic obstructive pulmonary disease, unspecified: Secondary | ICD-10-CM

## 2023-10-05 DIAGNOSIS — M87852 Other osteonecrosis, left femur: Secondary | ICD-10-CM | POA: Diagnosis present

## 2023-10-05 DIAGNOSIS — E876 Hypokalemia: Principal | ICD-10-CM

## 2023-10-05 DIAGNOSIS — E1140 Type 2 diabetes mellitus with diabetic neuropathy, unspecified: Secondary | ICD-10-CM | POA: Diagnosis present

## 2023-10-05 DIAGNOSIS — Z833 Family history of diabetes mellitus: Secondary | ICD-10-CM

## 2023-10-05 DIAGNOSIS — E1165 Type 2 diabetes mellitus with hyperglycemia: Secondary | ICD-10-CM | POA: Diagnosis present

## 2023-10-05 DIAGNOSIS — H6693 Otitis media, unspecified, bilateral: Secondary | ICD-10-CM | POA: Diagnosis present

## 2023-10-05 DIAGNOSIS — K567 Ileus, unspecified: Secondary | ICD-10-CM | POA: Diagnosis not present

## 2023-10-05 DIAGNOSIS — H6093 Unspecified otitis externa, bilateral: Secondary | ICD-10-CM | POA: Diagnosis present

## 2023-10-05 DIAGNOSIS — K5090 Crohn's disease, unspecified, without complications: Secondary | ICD-10-CM | POA: Diagnosis present

## 2023-10-05 DIAGNOSIS — Z794 Long term (current) use of insulin: Secondary | ICD-10-CM

## 2023-10-05 DIAGNOSIS — Z720 Tobacco use: Secondary | ICD-10-CM | POA: Diagnosis present

## 2023-10-05 DIAGNOSIS — Z716 Tobacco abuse counseling: Secondary | ICD-10-CM

## 2023-10-05 HISTORY — DX: Type 2 diabetes mellitus without complications: E11.9

## 2023-10-05 LAB — BASIC METABOLIC PANEL
ANION GAP: 20 mmol/L — ABNORMAL HIGH (ref 4–13)
BUN/CREA RATIO: 3 — ABNORMAL LOW (ref 6–22)
BUN: 2 mg/dL — ABNORMAL LOW (ref 7–25)
CALCIUM: 9.7 mg/dL (ref 8.6–10.3)
CHLORIDE: 89 mmol/L — ABNORMAL LOW (ref 98–107)
CO2 TOTAL: 26 mmol/L (ref 21–31)
CREATININE: 0.64 mg/dL (ref 0.60–1.30)
ESTIMATED GFR: 119 mL/min/{1.73_m2} (ref >59)
GLUCOSE: 295 mg/dL — ABNORMAL HIGH (ref 74–109)
OSMOLALITY, CALCULATED: 277 mosm/kg (ref 270–290)
POTASSIUM: 2.8 mmol/L — ABNORMAL LOW (ref 3.5–5.1)
SODIUM: 135 mmol/L — ABNORMAL LOW (ref 136–145)

## 2023-10-05 LAB — URINALYSIS, MACROSCOPIC
BILIRUBIN: NEGATIVE mg/dL
BLOOD: NEGATIVE mg/dL
GLUCOSE: 500 mg/dL — AB
KETONES: NEGATIVE mg/dL
LEUKOCYTES: NEGATIVE WBCs/uL
NITRITE: NEGATIVE
PH: 6 (ref 5.0–9.0)
PROTEIN: NEGATIVE mg/dL
SPECIFIC GRAVITY: 1.003 (ref 1.002–1.030)
UROBILINOGEN: NORMAL mg/dL

## 2023-10-05 LAB — MANUAL DIFFERENTIAL
EOSINOPHIL %: 3 % (ref 0–7)
EOSINOPHIL ABSOLUTE: 0.45 10*3/uL (ref 0.00–0.80)
EOSINOPHILS MANUAL: 3
LYMPHOCYTE %: 34 % (ref 25–45)
LYMPHOCYTE ABSOLUTE: 5.07 10*3/uL — ABNORMAL HIGH (ref 1.10–5.00)
LYMPHOCYTES MANUAL: 34
MONOCYTE %: 4 % (ref 0–12)
MONOCYTE ABSOLUTE: 0.6 10*3/uL (ref 0.00–1.30)
MONOCYTES MANUAL: 4
NEUTROPHIL %: 59 % (ref 40–76)
NEUTROPHIL ABSOLUTE: 8.79 10*3/uL — ABNORMAL HIGH (ref 1.80–8.40)
NEUTROPHILS MANUAL: 59
PLATELET MORPHOLOGY COMMENT: NORMAL
TOTAL CELLS COUNTED [#] IN BLOOD: 100
WBC: 14.9 10*3/uL

## 2023-10-05 LAB — GRAY TOP TUBE

## 2023-10-05 LAB — DRUG SCREEN, NO CONFIRMATION, URINE
AMPHETAMINES URINE: NEGATIVE
BARBITURATES URINE: NEGATIVE
BENZODIAZEPINES URINE: NEGATIVE
BUPRENORPHINE URINE: NEGATIVE
CANNABINOIDS URINE: NEGATIVE
COCAINE METABOLITES URINE: NEGATIVE
FENTANYL, URINE: NEGATIVE
METHADONE URINE: NEGATIVE
OPIATES URINE: NEGATIVE
OXYCODONE URINE: NEGATIVE
PCP URINE: NEGATIVE

## 2023-10-05 LAB — GAMMA GT: GGT: 915 U/L — ABNORMAL HIGH (ref 9–64)

## 2023-10-05 LAB — TOTAL PROTEIN: PROTEIN TOTAL: 7.8 g/dL (ref 6.4–8.9)

## 2023-10-05 LAB — CBC WITH DIFF
HCT: 45.9 % (ref 36.7–47.1)
HGB: 15.3 g/dL (ref 12.5–16.3)
MCH: 31.5 pg (ref 23.8–33.4)
MCHC: 33.3 g/dL (ref 32.5–36.3)
MCV: 94.6 fL (ref 73.0–96.2)
MPV: 7.5 fL (ref 7.4–11.4)
PLATELETS: 347 10*3/uL (ref 140–440)
RBC: 4.85 10*6/uL (ref 4.06–5.63)
RDW: 19.4 % — ABNORMAL HIGH (ref 12.1–16.2)
WBC: 14.9 10*3/uL — ABNORMAL HIGH (ref 3.6–10.2)

## 2023-10-05 LAB — LACTIC ACID - FIRST REFLEX: LACTIC ACID: 6 mmol/L (ref 0.5–2.2)

## 2023-10-05 LAB — FOLATE: FOLATE: 14.2 ng/mL (ref 5.9–24.8)

## 2023-10-05 LAB — ALT (SGPT): ALT (SGPT): 65 U/L — ABNORMAL HIGH (ref 7–52)

## 2023-10-05 LAB — BETA-HYDROXYBUTYRATE: BETA-HYDROXYBUTYRATE: 0.13 mmol/L (ref 0.02–0.27)

## 2023-10-05 LAB — LIPASE: LIPASE: 22 U/L (ref 11–82)

## 2023-10-05 LAB — URINALYSIS, MICROSCOPIC
RBCS: 1 /HPF (ref <4)
WBCS: <1 /HPF (ref <6)

## 2023-10-05 LAB — PT/INR
INR: 1.06 (ref 0.84–1.10)
PROTHROMBIN TIME: 12 s (ref 9.8–12.7)

## 2023-10-05 LAB — BLOOD GAS
%FIO2 (VENOUS): 21 %
BASE EXCESS: 2 mmol/L (ref 0.0–3.0)
BICARBONATE (VENOUS): 26.5 mmol/L (ref 22.0–29.0)
O2 SATURATION (VENOUS): 99.6 % (ref 40.0–85.0)
PCO2 (VENOUS): 36 mmHg — ABNORMAL LOW (ref 41–51)
PH (VENOUS): 7.46 — ABNORMAL HIGH (ref 7.32–7.43)
PO2 (VENOUS): 165 mmHg (ref 35–50)

## 2023-10-05 LAB — PTT (PARTIAL THROMBOPLASTIN TIME): APTT: 30.9 s (ref 25.0–38.0)

## 2023-10-05 LAB — ETHANOL, SERUM/PLASMA
ETHANOL: 176 mg/dL — ABNORMAL HIGH
ETHANOL: 174 mg/dL — ABNORMAL HIGH

## 2023-10-05 LAB — AST (SGOT): AST (SGOT): 105 U/L — ABNORMAL HIGH (ref 13–39)

## 2023-10-05 LAB — LACTIC ACID LEVEL W/ REFLEX FOR LEVEL >2.0: LACTIC ACID: 5.2 mmol/L (ref 0.5–2.2)

## 2023-10-05 LAB — GOLD TOP TUBE

## 2023-10-05 LAB — BILIRUBIN TOTAL: BILIRUBIN TOTAL: 0.8 mg/dL (ref 0.3–1.0)

## 2023-10-05 LAB — MAGNESIUM: MAGNESIUM: 1.8 mg/dL — ABNORMAL LOW (ref 1.9–2.7)

## 2023-10-05 LAB — ALK PHOS (ALKALINE PHOSPHATASE): ALKALINE PHOSPHATASE: 180 U/L — ABNORMAL HIGH (ref 34–104)

## 2023-10-05 LAB — VITAMIN B12: VITAMIN B 12: 392 pg/mL (ref 180–914)

## 2023-10-05 LAB — AMMONIA: AMMONIA: 38 umol/L (ref 16–53)

## 2023-10-05 MED ORDER — MAGNESIUM SULFATE 1 GRAM/100 ML IN DEXTROSE 5 % INTRAVENOUS PIGGYBACK
INJECTION | INTRAVENOUS | Status: AC
Start: 2023-10-05 — End: 2023-10-05
  Filled 2023-10-05: qty 100

## 2023-10-05 MED ORDER — SODIUM CHLORIDE 0.9 % IV BOLUS
1000.0000 mL | INJECTION | Freq: Once | Status: AC
Start: 2023-10-06 — End: 2023-10-06
  Administered 2023-10-05: 1000 mL via INTRAVENOUS
  Administered 2023-10-06: 0 mL via INTRAVENOUS

## 2023-10-05 MED ORDER — DEXTROSE 50 % IN WATER (D50W) INTRAVENOUS SYRINGE
12.5000 g | INJECTION | INTRAVENOUS | Status: DC | PRN
Start: 2023-10-05 — End: 2023-10-10

## 2023-10-05 MED ORDER — MAGNESIUM SULFATE 1 GRAM/100 ML IN DEXTROSE 5 % INTRAVENOUS PIGGYBACK
1.0000 g | INJECTION | INTRAVENOUS | Status: AC
Start: 2023-10-05 — End: 2023-10-05
  Administered 2023-10-05: 1 g via INTRAVENOUS
  Administered 2023-10-05: 0 g via INTRAVENOUS
  Administered 2023-10-05: 1 g via INTRAVENOUS
  Administered 2023-10-05: 0 g via INTRAVENOUS

## 2023-10-05 MED ORDER — ALUMINUM-MAG HYDROXIDE-SIMETHICONE 200 MG-200 MG-20 MG/5 ML ORAL SUSP
30.0000 mL | ORAL | Status: DC | PRN
Start: 2023-10-05 — End: 2023-10-10

## 2023-10-05 MED ORDER — BUDESONIDE DR - ER 3 MG CAPSULE,DELAYED,EXTENDED RELEASE
9.0000 mg | DELAYED_RELEASE_CAPSULE | Freq: Every morning | ORAL | Status: DC
Start: 2023-10-06 — End: 2023-10-10
  Administered 2023-10-06 – 2023-10-10 (×5): 0 mg via ORAL

## 2023-10-05 MED ORDER — BUDESONIDE-FORMOTEROL HFA 160 MCG-4.5 MCG/ACTUATION AEROSOL INHALER
2.0000 | INHALATION_SPRAY | Freq: Two times a day (BID) | RESPIRATORY_TRACT | Status: DC
Start: 2023-10-06 — End: 2023-10-10
  Administered 2023-10-06: 2 via RESPIRATORY_TRACT
  Administered 2023-10-06: 0 via RESPIRATORY_TRACT
  Administered 2023-10-07 – 2023-10-10 (×7): 2 via RESPIRATORY_TRACT
  Filled 2023-10-05 (×2): qty 6

## 2023-10-05 MED ORDER — INSULIN LISPRO 100 UNIT/ML SUB-Q SSIP VIAL
1.0000 [IU] | INJECTION | Freq: Four times a day (QID) | SUBCUTANEOUS | Status: DC
Start: 2023-10-06 — End: 2023-10-10
  Administered 2023-10-06: 0 [IU] via SUBCUTANEOUS
  Administered 2023-10-06: 1 [IU] via SUBCUTANEOUS
  Administered 2023-10-06 (×2): 2 [IU] via SUBCUTANEOUS
  Administered 2023-10-07 (×2): 1 [IU] via SUBCUTANEOUS
  Administered 2023-10-07 (×2): 2 [IU] via SUBCUTANEOUS
  Administered 2023-10-08 (×3): 1 [IU] via SUBCUTANEOUS
  Administered 2023-10-08: 2 [IU] via SUBCUTANEOUS
  Administered 2023-10-09 (×3): 0 [IU] via SUBCUTANEOUS
  Administered 2023-10-09: 2 [IU] via SUBCUTANEOUS
  Administered 2023-10-10: 0 [IU] via SUBCUTANEOUS
  Administered 2023-10-10: 1 [IU] via SUBCUTANEOUS
  Filled 2023-10-05: qty 1
  Filled 2023-10-05: qty 2
  Filled 2023-10-05: qty 1
  Filled 2023-10-05: qty 2
  Filled 2023-10-05 (×3): qty 1
  Filled 2023-10-05: qty 2
  Filled 2023-10-05: qty 5
  Filled 2023-10-05: qty 1
  Filled 2023-10-05: qty 2

## 2023-10-05 MED ORDER — PANTOPRAZOLE 40 MG TABLET,DELAYED RELEASE
40.0000 mg | DELAYED_RELEASE_TABLET | Freq: Every day | ORAL | Status: DC
Start: 2023-10-06 — End: 2023-10-10
  Administered 2023-10-06 – 2023-10-10 (×5): 40 mg via ORAL
  Filled 2023-10-05 (×5): qty 1

## 2023-10-05 MED ORDER — POTASSIUM CHLORIDE 20 MEQ/100ML IN STERILE WATER INTRAVENOUS PIGGYBACK
INJECTION | INTRAVENOUS | Status: AC
Start: 2023-10-05 — End: 2023-10-05
  Filled 2023-10-05: qty 200

## 2023-10-05 MED ORDER — SODIUM CHLORIDE 0.9 % IV BOLUS
1000.0000 mL | INJECTION | Status: AC
Start: 2023-10-05 — End: 2023-10-05
  Administered 2023-10-05: 1000 mL via INTRAVENOUS
  Administered 2023-10-05: 0 mL via INTRAVENOUS

## 2023-10-05 MED ORDER — TRAZODONE 50 MG TABLET
50.0000 mg | ORAL_TABLET | Freq: Every evening | ORAL | Status: DC | PRN
Start: 2023-10-05 — End: 2023-10-10
  Administered 2023-10-05 – 2023-10-10 (×5): 50 mg via ORAL
  Filled 2023-10-05 (×4): qty 1

## 2023-10-05 MED ORDER — POTASSIUM CHLORIDE 20 MEQ/100ML IN STERILE WATER INTRAVENOUS PIGGYBACK
20.0000 meq | INJECTION | INTRAVENOUS | Status: AC
Start: 2023-10-05 — End: 2023-10-06
  Administered 2023-10-05: 20 meq via INTRAVENOUS
  Administered 2023-10-05: 0 meq via INTRAVENOUS
  Administered 2023-10-05: 20 meq via INTRAVENOUS
  Administered 2023-10-06 (×2): 0 meq via INTRAVENOUS
  Administered 2023-10-06: 20 meq via INTRAVENOUS

## 2023-10-05 MED ORDER — MESALAMINE 400 MG CAPSULE (WITH DELAYED RELEASE TABLETS INSIDE)
1200.0000 mg | ORAL_CAPSULE | Freq: Every day | ORAL | Status: DC
Start: 2023-10-06 — End: 2023-10-10
  Administered 2023-10-06 – 2023-10-10 (×5): 1200 mg via ORAL
  Filled 2023-10-05 (×6): qty 3

## 2023-10-05 MED ORDER — TRAZODONE 50 MG TABLET
ORAL_TABLET | ORAL | Status: AC
Start: 2023-10-05 — End: 2023-10-05
  Filled 2023-10-05: qty 1

## 2023-10-05 MED ORDER — GLUCAGON HCL 1 MG/ML SOLUTION FOR INJECTION
1.0000 mg | Freq: Once | INTRAMUSCULAR | Status: DC | PRN
Start: 2023-10-05 — End: 2023-10-10

## 2023-10-05 MED ORDER — SPIRONOLACTONE 25 MG TABLET
50.0000 mg | ORAL_TABLET | Freq: Every day | ORAL | Status: DC
Start: 2023-10-06 — End: 2023-10-10
  Administered 2023-10-06 – 2023-10-10 (×5): 50 mg via ORAL
  Filled 2023-10-05 (×5): qty 2

## 2023-10-05 MED ORDER — THIAMINE MONONITRATE (VITAMIN B1) 100 MG TABLET
100.0000 mg | ORAL_TABLET | Freq: Every day | ORAL | Status: DC
Start: 2023-10-06 — End: 2023-10-10
  Administered 2023-10-06 – 2023-10-10 (×5): 100 mg via ORAL
  Filled 2023-10-05 (×5): qty 1

## 2023-10-05 MED ORDER — FOLIC ACID 1 MG TABLET
1.0000 mg | ORAL_TABLET | Freq: Every day | ORAL | Status: DC
Start: 2023-10-06 — End: 2023-10-10
  Administered 2023-10-06 – 2023-10-10 (×5): 1 mg via ORAL
  Filled 2023-10-05 (×5): qty 1

## 2023-10-05 MED ORDER — PYRIDOXINE (VITAMIN B6) 50 MG TABLET
100.0000 mg | ORAL_TABLET | Freq: Every day | ORAL | Status: DC
Start: 2023-10-06 — End: 2023-10-10
  Administered 2023-10-06 – 2023-10-10 (×5): 100 mg via ORAL
  Filled 2023-10-05 (×5): qty 2

## 2023-10-05 MED ORDER — MAGNESIUM HYDROXIDE 400 MG/5 ML ORAL SUSPENSION
30.0000 mL | Freq: Every evening | ORAL | Status: DC | PRN
Start: 2023-10-05 — End: 2023-10-10

## 2023-10-05 MED ORDER — FAMOTIDINE 40 MG TABLET
40.0000 mg | ORAL_TABLET | Freq: Every day | ORAL | Status: DC
Start: 2023-10-06 — End: 2023-10-10
  Administered 2023-10-06 – 2023-10-10 (×5): 40 mg via ORAL
  Filled 2023-10-05 (×5): qty 1

## 2023-10-05 MED ORDER — LORAZEPAM 2 MG/ML INJECTION WRAPPER
1.0000 mg | INTRAMUSCULAR | Status: DC | PRN
Start: 2023-10-06 — End: 2023-10-10

## 2023-10-05 MED ORDER — IOHEXOL 350 MG IODINE/ML INTRAVENOUS SOLUTION
75.0000 mL | INTRAVENOUS | Status: AC
Start: 2023-10-05 — End: 2023-10-05
  Administered 2023-10-05: 75 mL via INTRAVENOUS

## 2023-10-05 MED ORDER — DEXTROSE 40 % ORAL GEL
15.0000 g | ORAL | Status: DC | PRN
Start: 2023-10-05 — End: 2023-10-10

## 2023-10-05 MED ORDER — CHOLESTYRAMINE (WITH SUGAR) 4 GRAM POWDER FOR SUSP IN A PACKET
1.0000 | Freq: Every day | ORAL | Status: DC
Start: 2023-10-06 — End: 2023-10-10
  Administered 2023-10-06 – 2023-10-10 (×5): 1 via ORAL
  Filled 2023-10-05 (×6): qty 1

## 2023-10-05 NOTE — H&P (Signed)
 [facility]  Admission H&P    Date of Service: 10/05/2023   [name], 48 y.o. male  Encounter Start Date:  10/05/2023  Inpatient Admission Date: 10/05/2023  Date of Birth:  Feb 11, 1976  PCP: Mariah Milling, DO      Information Obtained from: patient  Chief Complaint:  abnormal labs    HPI: [name] is a 48 y.o., White male who presents at the recommendation of his PCP for abnormal labs. He has a history of chronic back pain with significant lumbar disc disease that was worsened by a work accident in late 2023.  He had conservative therapy for over a year before having surgery at [facility] by Dr. Jennye Moccasin about a month ago.  He has been self medicating with alcohol for his back pain for over a year after previous use was occasional.  He also has ulcerative colitis and chronic obstructive pulmonary disease with smoking.  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  He has previously been told that he did not have cirrhosis just an enlarged liver, but CT changes today along with increasing transaminases show likely cirrhosis.  He sees a Publishing rights manager at Dr. Phill Mutter office and had labs done today which were abnormal and was sent here.  Potassium and magnesium were low and glucose was 295.  AST and ALT are up at 1:05 a.m. and 65 respectively with an alkaline phosphatase of 1 80.  Serum alcohol level at 6:00 p.m. was 174.  Lactic acid was 5.2 on arrival and is now 6.0.  I have written for another L of normal saline.  Urine glucose is 500.      PAST MEDICAL:   Past Medical History:   Diagnosis Date    Asthma     Bleeding hemorrhoid     Bleeding ulcer     Cervical herniated disc     COPD (chronic obstructive pulmonary disease)     GERD (gastroesophageal reflux disease)     Hx MRSA infection     Hyperlipidemia     Hypertension     Pre-diabetes     Rotator cuff arthropathy, right      Ulcerative colitis            Medications Prior to Admission       Prescriptions    albuterol sulfate (PROVENTIL OR VENTOLIN OR PROAIR) 90 mcg/actuation Inhalation oral inhaler    Take 1-2 Puffs by inhalation Every 6 hours as needed    budesonide (ENTOCORT EC) 3 mg Oral Capsule, Delayed & Ext.Release    Take 3 Capsules (9 mg total) by mouth    budesonide-formoteroL (SYMBICORT) 160-4.5 mcg/actuation Inhalation oral inhaler    Take 2 Puffs by inhalation Twice daily    cefdinir (OMNICEF) 300 mg Oral Capsule    Take 1 Capsule (300 mg total) by mouth Every 12 hours    cholestyramine-sucrose (QUESTRAN) 4 gram Oral Powder in Packet    Take 1 Packet by mouth Once a day    ciprofloxacin-dexAMETHasone (CIPRODEX) 0.3-0.1 % Otic Drops, Suspension    instill 4 drops into affected ear(s) by otic route 2 times per day for 7 days    famotidine (PEPCID) 40 mg Oral Tablet    Take 1 Tablet (40 mg total) by mouth    Mesalamine (APRISO) 0.375 gram Oral Capsule, Sust. Release 24 hr    Take 4 Capsules (1.5 g total)  by mouth Once a day    omeprazole (PRILOSEC) 40 mg Oral Capsule, Delayed Release(E.C.)    Take 1 Capsule (40 mg total) by mouth Once a day    ondansetron (ZOFRAN ODT) 4 mg Oral Tablet, Rapid Dissolve    Take 1 Tablet (4 mg total) by mouth Every 8 hours as needed for Nausea/Vomiting    spironolactone (ALDACTONE) 50 mg Oral Tablet    Take 1 Tablet (50 mg total) by mouth Once a day    tamsulosin (FLOMAX) 0.4 mg Oral Capsule    Take 1 Capsule (0.4 mg total) by mouth Every evening after dinner for 30 days          Allergies   Allergen Reactions    Noctec [Chloral Hydrate] Anaphylaxis    Penicillins Anaphylaxis    Theophylline Shortness of Breath    Flexeril [Cyclobenzaprine] Mental Status Effect     Homicidal ideation    Prednisone Myalgia    Steroids [Steroid] NO Steroids unless approved by Attending Physician     Past Surgical History:   Procedure Laterality Date    HX BACK SURGERY      L7 L8 repair    HX CYST REMOVAL      lower  back    HX HERNIA REPAIR      HX TONSILLECTOMY              Family History:  Family Medical History:       Problem Relation (Age of Onset)    Anesthesia Complications Father    Arthritis-osteo Mother, Father    Asthma Mother, Father, Brother, Paternal Grandmother, Paternal Grandfather    Blood Clots Father, Paternal Grandmother, Paternal Grandfather    Cancer Mother, Father    Congestive Heart Failure Father, Paternal Grandmother, Paternal Grandfather    Coronary Artery Disease Father, Paternal Grandmother, Paternal Grandfather    Diabetes Mother, Father    Heart Attack Father, Paternal Grandmother, Paternal Grandfather    High Cholesterol Mother, Father    Hypertension (High Blood Pressure) Mother, Father    Stroke Father, Maternal Grandmother, Maternal Grandfather, Paternal Grandmother, Paternal Grandfather               Social History:  Social History     Tobacco Use    Smoking status: Every Day     Current packs/day: 1.00     Average packs/day: 1 pack/day for 30.0 years (30.0 ttl pk-yrs)     Types: Cigarettes    Smokeless tobacco: Never   Vaping Use    Vaping status: Never Used   Substance Use Topics    Alcohol use: Yes     Alcohol/week: 35.0 standard drinks of alcohol     Types: 35 Cans of beer per week    Drug use: Never        Review of Systems:  Review of Systems   Constitutional: Positive for malaise/fatigue and weight loss.   HENT: Negative.     Eyes:         Over the last few months, patient has had decline in vision with trouble reading fine print. He has not seen an eye doctor.   Cardiovascular: Negative.    Respiratory:  Positive for wheezing.    Endocrine: Positive for polyuria.   Skin: Negative.    Musculoskeletal:  Positive for back pain, falls, joint pain, myalgias, neck pain and stiffness.   Gastrointestinal:  Positive for constipation, heartburn and nausea.   Genitourinary:  Positive for dysuria and hesitancy.  Erectile dysfunction, enlarged prostate   Neurological:  Positive for  disturbances in coordination, dizziness, numbness, paresthesias and weakness.   Psychiatric/Behavioral:  Positive for depression and substance abuse (alcohol).    Allergic/Immunologic: Negative.         Examination:  Temperature: 36.1 C (96.9 F) Heart Rate: 97 BP (Non-Invasive): (!) 137/91   Respiratory Rate: 20 SpO2: 95 %     Physical Exam  Constitutional:       Appearance: He is ill-appearing.   HENT:      Head: Normocephalic and atraumatic.      Right Ear: External ear normal.      Left Ear: External ear normal.      Nose: Nose normal.      Mouth/Throat:      Mouth: Mucous membranes are dry.      Pharynx: Oropharynx is clear.   Eyes:      Extraocular Movements: Extraocular movements intact.      Conjunctiva/sclera: Conjunctivae normal.      Pupils: Pupils are equal, round, and reactive to light.   Cardiovascular:      Rate and Rhythm: Normal rate and regular rhythm.      Pulses: Normal pulses.      Heart sounds: Normal heart sounds.   Pulmonary:      Effort: Pulmonary effort is normal.      Comments: Decreased breath sounds bilateral bases  Abdominal:      General: Bowel sounds are normal.      Palpations: Abdomen is soft.   Musculoskeletal:         General: Normal range of motion.      Cervical back: Normal range of motion and neck supple.   Skin:     General: Skin is warm.      Capillary Refill: Capillary refill takes less than 2 seconds.   Neurological:      General: No focal deficit present.      Mental Status: He is alert and oriented to person, place, and time.   Psychiatric:         Mood and Affect: Mood normal.         Behavior: Behavior normal.          Labs:    Lab Results Today:    Results for orders placed or performed during the hospital encounter of 10/05/23 (from the past 24 hours)   BASIC METABOLIC PANEL   Result Value Ref Range    SODIUM 135 (L) 136 - 145 mmol/L    POTASSIUM 2.8 (L) 3.5 - 5.1 mmol/L    CHLORIDE 89 (L) 98 - 107 mmol/L    CO2 TOTAL 26 21 - 31 mmol/L    ANION GAP 20 (H) 4 - 13  mmol/L    CALCIUM 9.7 8.6 - 10.3 mg/dL    GLUCOSE 161 (H) 74 - 109 mg/dL    BUN 2 (L) 7 - 25 mg/dL    CREATININE 0.96 0.45 - 1.30 mg/dL    BUN/CREA RATIO 3 (L) 6 - 22    ESTIMATED GFR 119 >59 mL/min/1.64m^2    OSMOLALITY, CALCULATED 277 270 - 290 mOsm/kg   AMMONIA   Result Value Ref Range    AMMONIA 38 16 - 53 umol/L   ALT (SGPT)   Result Value Ref Range    ALT (SGPT) 65 (H) 7 - 52 U/L   AST (SGOT)   Result Value Ref Range    AST (SGOT) 105 (H) 13 - 39 U/L  BILIRUBIN TOTAL   Result Value Ref Range    BILIRUBIN TOTAL 0.8 0.3 - 1.0 mg/dL   ALK PHOS (ALKALINE PHOSPHATASE)   Result Value Ref Range    ALKALINE PHOSPHATASE 180 (H) 34 - 104 U/L   GAMMA GT   Result Value Ref Range    GGT 915 (H) 9 - 64 U/L   PROTEIN TOTAL   Result Value Ref Range    PROTEIN TOTAL 7.8 6.4 - 8.9 g/dL   MAGNESIUM   Result Value Ref Range    MAGNESIUM 1.8 (L) 1.9 - 2.7 mg/dL   LIPASE   Result Value Ref Range    LIPASE 22 11 - 82 U/L   CBC WITH DIFF   Result Value Ref Range    WBC 14.9 (H) 3.6 - 10.2 x10^3/uL    RBC 4.85 4.06 - 5.63 x10^6/uL    HGB 15.3 12.5 - 16.3 g/dL    HCT 47.8 29.5 - 62.1 %    MCV 94.6 73.0 - 96.2 fL    MCH 31.5 23.8 - 33.4 pg    MCHC 33.3 32.5 - 36.3 g/dL    RDW 30.8 (H) 65.7 - 16.2 %    PLATELETS 347 140 - 440 x10^3/uL    MPV 7.5 7.4 - 11.4 fL   ETHANOL, SERUM/PLASMA   Result Value Ref Range    ETHANOL 176 (H) 0 mg/dL   MANUAL DIFFERENTIAL   Result Value Ref Range    WBC 14.9 x10^3/uL    NEUTROPHIL % 59 40 - 76 %    LYMPHOCYTE % 34 25 - 45 %    MONOCYTE % 4 0 - 12 %    EOSINOPHIL % 3 0 - 7 %    BASOPHIL %      METAMYELOCYTE %      MYELOCYTE %      PROMYELOCYTE %      BAND %      BLAST %      OTHER %      NEUTROPHIL ABSOLUTE 8.79 (H) 1.80 - 8.40 x10^3/uL    LYMPHOCYTE ABSOLUTE 5.07 (H) 1.10 - 5.00 x10^3/uL    MONOCYTE ABSOLUTE 0.60 0.00 - 1.30 x10^3/uL    EOSINOPHIL ABSOLUTE 0.45 0.00 - 0.80 x10^3/uL    BASOPHIL ABSOLUTE      METAMYELOCYTE ABSOLUTE      MYELOCYTE ABSOLUTE      PROMYELOCYTE ABSOLUTE      BLAST ABSOLUTE       OTHER CELL ABSOLUTE      ANISOCYTOSIS 1+ (10-25%)     POLYCHROMASIA      POIKILOCYTOSIS      BASOPHILIC STIPPLING      MICROCYTOSIS      MACROCYTOSIS      ROULEAUX      SCHISTOCYTES      SPHEROCYTES      TARGET CELLS      TEARDROP CELLS      OVALOCYTE (ELLIPTOCYTE)      CRENATED RED CELLS      STOMATOCYTES      ACANTHOCYTES (SPUR CELL)      ECHINOCYTE (BURR CELL)      BLISTER CELLS      RBC AGGLUTINATES      HOWELL JOLLY BODIES      ATYPICAL LYMPHOCYTES      TOXIC GRANULATION      DOHLE BODIES      TOXIC VACUOLIZATION      AUER RODS      BASKET CELLS  HYPERSEGMENTATION      LARGE PLATELETS      PLATELET CLUMPS      PLATELET MORPHOLOGY COMMENT Normal     BANDS NEUTROPHILS MANUAL      BAND ABSOLUTE      NEUTROPHILS MANUAL 59     LYMPHOCYTES MANUAL 34     MONOCYTES MANUAL 4     EOSINOPHILS MANUAL 3     BASOPHILS MANUAL      PROMYELOCYTES MANUAL      MYELOCYTES MANUAL      METAMYELOCYTES MANUAL      BLASTS MANUAL      TOTAL CELLS COUNTED [#] IN BLOOD 100     OTHER CELLS MANUAL      NUCLEATED RBC MANUAL      PLASMA CELL %      PLASMA CELL ABSOLUE      PLASMA CELLS MANUAL      HYPOCHROMASIA     ETHANOL, SERUM   Result Value Ref Range    ETHANOL 174 (H) 0 mg/dL   VITAMIN W29   Result Value Ref Range    VITAMIN B 12 392 180 - 914 pg/mL   FOLATE   Result Value Ref Range    FOLATE 14.2 5.9 - 24.8 ng/mL   BETA-HYDROXYBUTYRATE   Result Value Ref Range    BETA-HYDROXYBUTYRATE 0.13 0.02 - 0.27 mmol/L   PT/INR   Result Value Ref Range    PROTHROMBIN TIME 12.0 9.8 - 12.7 seconds    INR 1.06 0.84 - 1.10   PTT (PARTIAL THROMBOPLASTIN TIME)   Result Value Ref Range    APTT 30.9 25.0 - 38.0 seconds   GOLD TOP TUBE   Result Value Ref Range    RAINBOW/EXTRA TUBE AUTO RESULT Yes    GRAY TOP TUBE   Result Value Ref Range    RAINBOW/EXTRA TUBE AUTO RESULT Yes    URINALYSIS, MACROSCOPIC   Result Value Ref Range    COLOR Yellow Colorless, Light Yellow, Yellow    APPEARANCE Clear Clear    SPECIFIC GRAVITY 1.003 1.002 - 1.030    PH 6.0  5.0 - 9.0    LEUKOCYTES Negative Negative, 100  WBCs/uL    NITRITE Negative Negative    PROTEIN Negative Negative, 10 , 20  mg/dL    GLUCOSE 562 (A) Negative, 30  mg/dL    KETONES Negative Negative, Trace mg/dL    BILIRUBIN Negative Negative, 0.5 mg/dL    BLOOD Negative Negative, 0.03 mg/dL    UROBILINOGEN Normal Normal mg/dL   URINALYSIS, MICROSCOPIC   Result Value Ref Range    MUCOUS Rare Rare, Occasional, Few /hpf    RBCS 1 <4 /hpf    WBCS <1 <6 /hpf   URINE DRUG SCREEN   Result Value Ref Range    AMPHETAMINES URINE Negative Negative    BARBITURATES URINE Negative Negative    BENZODIAZEPINES URINE Negative Negative    BUPRENORPHINE URINE Negative Negative    CANNABINOIDS URINE Negative Negative    COCAINE METABOLITES URINE Negative Negative    FENTANYL, URINE Negative Negative    OPIATES URINE Negative Negative    OXYCODONE URINE Negative Negative    PCP URINE Negative Negative    METHADONE URINE Negative Negative   LACTIC ACID LEVEL W/ REFLEX FOR LEVEL >2.0   Result Value Ref Range    LACTIC ACID 5.2 (HH) 0.5 - 2.2 mmol/L   BLOOD GAS Venous   Result Value Ref Range    %FIO2 (VENOUS) 21.0 %  PH (VENOUS) 7.46 (H) 7.32 - 7.43    PCO2 (VENOUS) 36 (L) 41 - 51 mm/Hg    PO2 (VENOUS) 165 35 - 50 mm/Hg    BICARBONATE (VENOUS) 26.5 22.0 - 29.0 mmol/L    BASE EXCESS 2.0 0.0 - 3.0 mmol/L    O2 SATURATION (VENOUS) 99.6 40.0 - 85.0 %   LACTIC ACID - FIRST REFLEX   Result Value Ref Range    LACTIC ACID 6.0 (HH) 0.5 - 2.2 mmol/L       Imaging Studies:  No results found.    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)    Cirrhosis    Alcohol abuse    Ulcerative colitis    Continuous tobacco abuse    COPD (chronic obstructive pulmonary disease)    HTN (hypertension)    Lactic acidosis     1)Lactic acidosis  Secondary to dehydration, etoh abuse, new onset diabetes   Continue aggressive IV hydration and  follow    2)New onset diabetes mellitus  HA1C  Sliding scale insulin to determine total 24 hour need  Consult Endocrinology for initial recommendations    3)Alcohol abuse  CIWA protocol, thiamine, folate  Counseled patient at length about cessation    4)Cirrhosis  Supportive care, hydration  Follow with Concha Se as outpatient  Liver US    5)Ulcerative colitis  Chronic  Continue home medication    6)Tobacco abuse  Nicotine patch if needed  Counseled patient at length about cessation    7)COPD  Stable  Home maintenance inhaler  Prn albuterol    8)HTN  Continue home medications as approptiate    9)Hypokalemia  Secondary to ETOH abuse/poor nutrition  Replacing via IV    10)Hypomagnesemia  Replaced via IV    11)Chronic back pain  Pt needs referral to interventional pain specialist as outpatient    DVT/PE Prophylaxis: SCDs    Time spent on patient greater than one hour including extensive record review and counseling patient on medical issues, cessation of smoking and alcohol, need for ophthalmology, urology, and interventional pain specialist.    Jamal Maes, DO

## 2023-10-05 NOTE — ED APP Handoff Note (Signed)
 [facility] - Emergency Department  Emergency Department  Provider in Triage Note    Name: [name]  Age: 48 y.o.  Gender: male     Subjective:   [name] is a 48 y.o. male who presents with complaint of Abnormal Lab Result  .  Pt to ED from PCP for admission d/t elevated liver enzymes according to patient.     Objective:   Filed Vitals:    10/05/23 1759   BP: (!) 137/91   Pulse: 97   Resp: 20   Temp: 36.1 C (96.9 F)   SpO2: 95%        Plan:  Please see initial orders and work-up below.  This is to be continued with full evaluation in the main Emergency Department.     No current facility-administered medications for this encounter.     Results for orders placed or performed during the hospital encounter of 10/05/23 (from the past 24 hours)   CBC/DIFF    Narrative    The following orders were created for panel order CBC/DIFF.  Procedure                               Abnormality         Status                     ---------                               -----------         ------                     CBC WITH HYQM[[id number]]                                                                 Please view results for these tests on the individual orders.   URINALYSIS, MACROSCOPIC AND MICROSCOPIC W/CULTURE REFLEX [PRN ONLY]    Specimen: Urine, Clean Catch    Narrative    The following orders were created for panel order URINALYSIS, MACROSCOPIC AND MICROSCOPIC W/CULTURE REFLEX [PRN ONLY].  Procedure                               Abnormality         Status                     ---------                               -----------         ------                     URINALYSIS, MACROSCOPIC[[id number]]  URINALYSIS, MICROSCOPIC[[id number]]                                                       Please view results for these tests on the individual orders.

## 2023-10-05 NOTE — ED Triage Notes (Signed)
 Abn labs sent by PCP liver profile . Having abd pain

## 2023-10-05 NOTE — ED Nurses Note (Signed)
 Pt requesting something to drink. Provider ok'ed and gingerale and ice was given to pt.

## 2023-10-05 NOTE — ED Provider Notes (Signed)
 [facility] - Emergency Department  ED Primary Provider Note  History of Present Illness   Chief Complaint   Patient presents with    Abnormal Lab Result     [name] is a 48 y.o. male who had concerns including Abnormal Lab Result.  Arrival: The patient arrived by Car    Patient is a 48 year old male past medical history of alcohol abuse chronic back pain chronic obstructive pulmonary disease tobacco use presents emergency department with complaints of abnormal labs.  He went saw his primary care today and was told that his liver enzymes were abnormal.  Patient states he typically drinks 4-5 pre mixed alcoholic beverages proximally 16% alcohol every night.  He states he primarily does this to mitigate the chronic pain he has.  He has some abdominal pain today.  Denies nausea vomiting.  Denies fevers.  Drank earlier today.  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  Patient also states that his peripheral neuropathy he has been worse than usual for the past several months.      History Reviewed This Encounter: Medical History  Surgical History  Family History  Social History    Physical Exam   ED Triage Vitals [10/05/23 1759]   BP (Non-Invasive) (!) 137/91   Heart Rate 97   Respiratory Rate 20   Temperature 36.1 C (96.9 F)   SpO2 95 %   Weight 83.9 kg (185 lb)   Height 1.707 m (5' 7.2")     Physical Exam  Constitutional:       Appearance: Normal appearance.   HENT:      Head: Normocephalic.      Nose: Nose normal.      Mouth/Throat:      Mouth: Mucous membranes are moist.   Eyes:      Extraocular Movements: Extraocular movements intact.      Pupils: Pupils are equal, round, and reactive to light.   Cardiovascular:      Rate and Rhythm: Normal rate and regular rhythm.      Pulses: Normal pulses.      Heart sounds: Normal heart sounds.   Pulmonary:      Effort: Pulmonary effort is normal.      Breath sounds: Normal breath sounds.   Abdominal:      General: Abdomen is flat.  There is distension.   Musculoskeletal:      Cervical back: Normal range of motion.   Neurological:      Mental Status: He is alert.       Patient Data     Labs Ordered/Reviewed   BASIC METABOLIC PANEL - Abnormal; Notable for the following components:       Result Value    SODIUM 135 (*)     POTASSIUM 2.8 (*)     CHLORIDE 89 (*)     ANION GAP 20 (*)     GLUCOSE 295 (*)     BUN 2 (*)     BUN/CREA RATIO 3 (*)     All other components within normal limits    Narrative:     Estimated Glomerular Filtration Rate (eGFR) is calculated using the CKD-EPI (2021) equation, intended for patients 32 years of age and older. If gender is not documented or "unknown", there will be no eGFR calculation.     ALT (SGPT) - Abnormal; Notable for the following components:    ALT (SGPT) 65 (*)     All other components within normal limits  AST (SGOT) - Abnormal; Notable for the following components:    AST (SGOT) 105 (*)     All other components within normal limits   ALK PHOS (ALKALINE PHOSPHATASE) - Abnormal; Notable for the following components:    ALKALINE PHOSPHATASE 180 (*)     All other components within normal limits   GAMMA GT - Abnormal; Notable for the following components:    GGT 915 (*)     All other components within normal limits   MAGNESIUM - Abnormal; Notable for the following components:    MAGNESIUM 1.8 (*)     All other components within normal limits   CBC WITH DIFF - Abnormal; Notable for the following components:    WBC 14.9 (*)     RDW 19.4 (*)     All other components within normal limits   URINALYSIS, MACROSCOPIC - Abnormal; Notable for the following components:    GLUCOSE 500 (*)     All other components within normal limits   ETHANOL, SERUM/PLASMA - Abnormal; Notable for the following components:    ETHANOL 176 (*)     All other components within normal limits   MANUAL DIFFERENTIAL - Abnormal; Notable for the following components:    NEUTROPHIL ABSOLUTE 8.79 (*)     LYMPHOCYTE ABSOLUTE 5.07 (*)     All other  components within normal limits   ETHANOL, SERUM/PLASMA - Abnormal; Notable for the following components:    ETHANOL 174 (*)     All other components within normal limits   LACTIC ACID LEVEL W/ REFLEX FOR LEVEL >2.0 - Abnormal; Notable for the following components:    LACTIC ACID 5.2 (*)     All other components within normal limits   BLOOD GAS - Abnormal; Notable for the following components:    PH (VENOUS) 7.46 (*)     PCO2 (VENOUS) 36 (*)     All other components within normal limits    Narrative:     Manufacturer does not recommend venous sample for assessment of patient oxygenation status. A reference range for pO2, Oxyhemoglobin and Oxygen Saturation is provided but abnormal results will not flag in Epic.   AMMONIA - Normal   BILIRUBIN TOTAL - Normal   TOTAL PROTEIN - Normal   LIPASE - Normal   URINALYSIS, MICROSCOPIC - Normal   PT/INR - Normal    Narrative:     In the setting of warfarin therapy, a moderate-intensity INR goal range is 2.0 to 3.0 and a high-intensity INR goal range is 2.5 to 3.5.    INR is ONLY validated to determine the level of anticoagulation with vitamin K antagonists (warfarin). Other factors may elevate the INR including but not limited to direct oral anticoagulants (DOACs), liver dysfunction, vitamin K deficiency, DIC, factor deficiencies, and factor inhibitors.   PTT (PARTIAL THROMBOPLASTIN TIME) - Normal   DRUG SCREEN, NO CONFIRMATION, URINE - Normal    Narrative:     Any results reported as "positive" on this urine drug screen are unconfirmed screening results and should be used for medical(i.e.,treatment)purposes only. Unconfirmed screening results must not be used for non-medical purposes (e.g. employment or legal testing). Upon request, all results reported as "positive" can be sent to a reference laboratory for confirmation by GCMS.     Reporting Limits (cut-off concentrations)     Cocaine 300 ng/mL  Opiates 300 ng/mL  THC 50 ng/mL  Amphetamine 1000 ng/mL  Phencyclidine 25  ng/mL  Benzodiazepine 300 ng/mL  Barbiturates 300 ng/mL  Methadone 300 ng/mL  Oxycodone 100 ng/mL  Buprenorphine 5 ng/mL  Fentanyl 5 ng/mL     VITAMIN B12 - Normal   FOLATE - Normal   BETA-HYDROXYBUTYRATE - Normal   CBC/DIFF    Narrative:     The following orders were created for panel order CBC/DIFF.  Procedure                               Abnormality         Status                     ---------                               -----------         ------                     CBC WITH YQMV[[id number]]                Abnormal            Final result               MANUAL DIFFERENTIAL[[id number]]          Abnormal            Final result                 Please view results for these tests on the individual orders.   URINALYSIS, MACROSCOPIC AND MICROSCOPIC W/CULTURE REFLEX    Narrative:     The following orders were created for panel order URINALYSIS, MACROSCOPIC AND MICROSCOPIC W/CULTURE REFLEX [PRN ONLY].  Procedure                               Abnormality         Status                     ---------                               -----------         ------                     URINALYSIS, MACROSCOPIC[[id number]]      Abnormal            Final result               URINALYSIS, MICROSCOPIC[[id number]]      Normal              Final result                 Please view results for these tests on the individual orders.   GRAY TOP TUBE   EXTRA TUBES    Narrative:     The following orders were created for panel order EXTRA TUBES.  Procedure                               Abnormality         Status                     ---------                               -----------         ------  GOLD TOP TUBE[[id number]]                                    In process                 GRAY TOP [id number]                                    Final result                 Please view results for these tests on the individual orders.   GOLD TOP TUBE   LACTIC ACID - FIRST REFLEX   HGA1C (HEMOGLOBIN A1C WITH EST AVG GLUCOSE)     CT  ABDOMEN PELVIS W IV CONTRAST   Final Result by Edi, [facility] In (04/17 1949)   HEPATOMEGALY INCREASED COMPARED TO FEBRUARY 2025 WITH ASSOCIATED HETEROGENEOUS DECREASED BACKGROUND ATTENUATION. 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            AVN BILATERAL FEMORAL HEADS         Radiologist location ID: [id number]           Medical Decision Making        Medical Decision Making  CBC demonstrates leukocytosis white blood cell count 14.9 1000 metabolic panel demonstrates 2-1 AST to ALT ratio as well as elevated alk-phos abd gamma GT.  Normal ammonia.  Patient's metabolic panel also demonstrates new onset diabetes with blood glucose was 296 as well as hypokalemia and hypomagnesemia.  CT abdomen pelvis demonstrates hepatomegaly as well as bilateral avascular necrosis of the hips.  Patient was ordered fluid bolus potassium magnesium.  Discussed case with hospital team agreed evaluate patient for admission patient states that he has never really stopped drinking before although he has once or twice he denies any history of seizures he is unsure if he had withdrawals or not.  Discussed case with the hospital team agreed evaluate patient for admission    Risk  Prescription drug management.  Parenteral controlled substances.  Decision regarding hospitalization.                Medications Ordered/Administered in the ED   potassium chloride 20 mEq in SW 100 mL premix infusion (0 mEq Intravenous Stopped 10/05/23 2212)   NS bolus infusion 1,000 mL (0 mL Intravenous Stopped 10/05/23 2210)   magnesium sulfate 1 G in D5W 100 mL premix IVPB (0 g Intravenous Stopped 10/05/23 2117)   iohexol (OMNIPAQUE 350) infusion (75 mL Intravenous Given 10/05/23 1938)     Clinical Impression   Alcohol abuse (Primary)   Diabetes mellitus, new onset (CMS HCC)   Hypokalemia   Hypomagnesemia       Disposition: Admitted

## 2023-10-06 ENCOUNTER — Inpatient Hospital Stay (HOSPITAL_COMMUNITY)

## 2023-10-06 ENCOUNTER — Encounter (HOSPITAL_COMMUNITY): Payer: Self-pay | Admitting: Internal Medicine

## 2023-10-06 DIAGNOSIS — R21 Rash and other nonspecific skin eruption: Secondary | ICD-10-CM

## 2023-10-06 DIAGNOSIS — M549 Dorsalgia, unspecified: Secondary | ICD-10-CM

## 2023-10-06 DIAGNOSIS — R140 Abdominal distension (gaseous): Secondary | ICD-10-CM

## 2023-10-06 DIAGNOSIS — Z833 Family history of diabetes mellitus: Secondary | ICD-10-CM

## 2023-10-06 DIAGNOSIS — Z79899 Other long term (current) drug therapy: Secondary | ICD-10-CM

## 2023-10-06 DIAGNOSIS — E1165 Type 2 diabetes mellitus with hyperglycemia: Secondary | ICD-10-CM

## 2023-10-06 DIAGNOSIS — R160 Hepatomegaly, not elsewhere classified: Secondary | ICD-10-CM

## 2023-10-06 DIAGNOSIS — Z794 Long term (current) use of insulin: Secondary | ICD-10-CM

## 2023-10-06 LAB — CBC WITH DIFF
BASOPHIL #: 0.1 10*3/uL (ref 0.00–0.10)
BASOPHIL %: 1 % (ref 0–1)
EOSINOPHIL #: 0.5 10*3/uL (ref 0.00–0.50)
EOSINOPHIL %: 4 % (ref 1–8)
HCT: 41 % (ref 36.7–47.1)
HGB: 13.7 g/dL (ref 12.5–16.3)
LYMPHOCYTE #: 3.1 10*3/uL (ref 1.00–3.20)
LYMPHOCYTE %: 25 % (ref 15–43)
MCH: 31.5 pg (ref 23.8–33.4)
MCHC: 33.5 g/dL (ref 32.5–36.3)
MCV: 94.1 fL (ref 73.0–96.2)
MONOCYTE #: 1.1 10*3/uL (ref 0.30–1.10)
MONOCYTE %: 9 % (ref 6–14)
MPV: 7.7 fL (ref 7.4–11.4)
NEUTROPHIL #: 7.5 10*3/uL (ref 1.70–7.60)
NEUTROPHIL %: 61 % (ref 44–74)
PLATELETS: 303 10*3/uL (ref 140–440)
RBC: 4.35 10*6/uL (ref 4.06–5.63)
RDW: 18.7 % — ABNORMAL HIGH (ref 12.1–16.2)
WBC: 12.3 10*3/uL — ABNORMAL HIGH (ref 3.6–10.2)

## 2023-10-06 LAB — LIGHT GREEN TOP TUBE

## 2023-10-06 LAB — MAGNESIUM: MAGNESIUM: 1.9 mg/dL (ref 1.9–2.7)

## 2023-10-06 LAB — LACTIC ACID LEVEL W/ REFLEX FOR LEVEL >2.0: LACTIC ACID: 3.5 mmol/L — ABNORMAL HIGH (ref 0.5–2.2)

## 2023-10-06 LAB — LACTIC ACID - FIRST REFLEX: LACTIC ACID: 3 mmol/L — ABNORMAL HIGH (ref 0.5–2.2)

## 2023-10-06 LAB — BASIC METABOLIC PANEL
ANION GAP: 15 mmol/L — ABNORMAL HIGH (ref 4–13)
BUN/CREA RATIO: 3 — ABNORMAL LOW (ref 6–22)
BUN: 2 mg/dL — ABNORMAL LOW (ref 7–25)
CALCIUM: 8.1 mg/dL — ABNORMAL LOW (ref 8.6–10.3)
CHLORIDE: 98 mmol/L (ref 98–107)
CO2 TOTAL: 23 mmol/L (ref 21–31)
CREATININE: 0.63 mg/dL (ref 0.60–1.30)
ESTIMATED GFR: 120 mL/min/{1.73_m2} (ref >59)
GLUCOSE: 140 mg/dL — ABNORMAL HIGH (ref 74–109)
OSMOLALITY, CALCULATED: 271 mosm/kg (ref 270–290)
POTASSIUM: 3.5 mmol/L (ref 3.5–5.1)
SODIUM: 136 mmol/L (ref 136–145)

## 2023-10-06 LAB — GOLD TOP TUBE

## 2023-10-06 LAB — HGA1C (HEMOGLOBIN A1C WITH EST AVG GLUCOSE): HEMOGLOBIN A1C: 7.3 % — ABNORMAL HIGH (ref 4.0–6.0)

## 2023-10-06 LAB — POC BLOOD GLUCOSE (RESULTS)
GLUCOSE, POC: 179 mg/dL — ABNORMAL HIGH (ref 70–100)
GLUCOSE, POC: 181 mg/dL — ABNORMAL HIGH (ref 70–100)
GLUCOSE, POC: 212 mg/dL — ABNORMAL HIGH (ref 70–100)
GLUCOSE, POC: 244 mg/dL — ABNORMAL HIGH (ref 70–100)

## 2023-10-06 LAB — C-REACTIVE PROTEIN (CRP): C-REACTIVE PROTEIN (CRP): 1.5 mg/dL — ABNORMAL HIGH (ref 0.1–0.5)

## 2023-10-06 LAB — LACTIC ACID - SECOND REFLEX
LACTIC ACID: 4.6 mmol/L (ref 0.5–2.2)
LACTIC ACID: 3 mmol/L — ABNORMAL HIGH (ref 0.5–2.2)

## 2023-10-06 LAB — PHOSPHORUS: PHOSPHORUS: 3 mg/dL — ABNORMAL LOW (ref 3.7–7.2)

## 2023-10-06 LAB — LAVENDER TOP TUBE

## 2023-10-06 MED ORDER — ACETAMINOPHEN 325 MG TABLET
650.0000 mg | ORAL_TABLET | ORAL | Status: DC | PRN
Start: 2023-10-06 — End: 2023-10-07

## 2023-10-06 MED ORDER — CIPROFLOXACIN 0.3 %-DEXAMETHASONE 0.1 % EAR DROPS,SUSPENSION
4.0000 [drp] | Freq: Two times a day (BID) | OTIC | Status: DC
Start: 2023-10-06 — End: 2023-10-06
  Filled 2023-10-06: qty 7.5

## 2023-10-06 MED ORDER — CEFDINIR 300 MG CAPSULE
300.0000 mg | ORAL_CAPSULE | Freq: Two times a day (BID) | ORAL | Status: DC
Start: 2023-10-07 — End: 2023-10-10
  Administered 2023-10-07 – 2023-10-10 (×7): 300 mg via ORAL
  Filled 2023-10-06 (×7): qty 1

## 2023-10-06 MED ORDER — AEROCHAMBER W/ FLOWSIGNAL SPACER
Freq: Once | Status: AC
Start: 2023-10-06 — End: 2023-10-06

## 2023-10-06 MED ORDER — NICOTINE 21 MG/24 HR DAILY TRANSDERMAL PATCH
21.0000 mg | MEDICATED_PATCH | Freq: Every day | TRANSDERMAL | Status: DC
Start: 2023-10-06 — End: 2023-10-10
  Administered 2023-10-06 – 2023-10-10 (×5): 21 mg via TRANSDERMAL
  Filled 2023-10-06 (×5): qty 1

## 2023-10-06 MED ORDER — SODIUM CHLORIDE 0.9 % INTRAVENOUS SOLUTION
INTRAVENOUS | Status: DC
Start: 2023-10-06 — End: 2023-10-07
  Administered 2023-10-07: 0 mL via INTRAVENOUS

## 2023-10-06 MED ORDER — CEFDINIR 300 MG CAPSULE
300.0000 mg | ORAL_CAPSULE | Freq: Two times a day (BID) | ORAL | Status: DC
Start: 2023-10-06 — End: 2023-10-06
  Administered 2023-10-06: 300 mg via ORAL
  Filled 2023-10-06: qty 1

## 2023-10-06 MED ORDER — SODIUM CHLORIDE 0.9 % INTRAVENOUS SOLUTION
10.0000 mmol | Freq: Once | INTRAVENOUS | Status: DC
Start: 2023-10-06 — End: 2023-10-06
  Filled 2023-10-06: qty 3.33

## 2023-10-06 MED ORDER — OXYCODONE 5 MG TABLET
5.0000 mg | ORAL_TABLET | Freq: Four times a day (QID) | ORAL | Status: DC | PRN
Start: 2023-10-06 — End: 2023-10-10
  Administered 2023-10-06 – 2023-10-10 (×11): 5 mg via ORAL
  Filled 2023-10-06 (×11): qty 1

## 2023-10-06 MED ORDER — CIPROFLOXACIN 0.3 %-DEXAMETHASONE 0.1 % EAR DROPS,SUSPENSION
4.0000 [drp] | Freq: Two times a day (BID) | OTIC | Status: DC
Start: 2023-10-06 — End: 2023-10-10
  Administered 2023-10-06 – 2023-10-10 (×8): 4 [drp] via OTIC
  Filled 2023-10-06: qty 7.5

## 2023-10-06 MED ORDER — CEFDINIR 300 MG CAPSULE
300.0000 mg | ORAL_CAPSULE | Freq: Two times a day (BID) | ORAL | 0 refills | Status: AC
Start: 2023-10-06 — End: 2023-11-01

## 2023-10-06 MED ORDER — MULTIVITAMIN WITH FOLIC ACID 400 MCG TABLET
1.0000 | ORAL_TABLET | Freq: Every day | ORAL | Status: DC
Start: 2023-10-06 — End: 2023-10-10
  Administered 2023-10-06 – 2023-10-10 (×5): 1 via ORAL
  Filled 2023-10-06 (×5): qty 1

## 2023-10-06 NOTE — Telemedicine Consult (Signed)
 Endocrinology Initial Telemedicine Consult     Telemedicine Documentation:  Patient Location: [facility]  Patient/family aware of provider location: yes  Patient/family consent for telemedicine: yes  Examination observed and performed by: Lael Pierce, MD  Provider's Physical Location: [facility]    Today's Date:       10/06/2023  Encounter Start Date:      10/05/2023  Inpatient Admission Date:  10/05/2023  Name:             [name]  Age:             48 y.o.  Attending:             Kaur, Preetraj, MD  PCP             Quin Brush, DO    Reason for Consult: Newly-Diagnosed Diabetes Mellitus  Chief Complaint: Lactic acidosis, New-Onset Diabetes    HISTORY OF PRESENTING ILLNESS:     [name] is a 48 y.o. male with PMH of pre-diabetes, asthma, HTN, HLD, COPD, GERD, ulcerative colitis/Crohn's, Barrett's esophagus, liver cirrhosis, and alcohol use admitted for lactic acidosis secondary to dehydration, alcohol use, and new-onset diabetes. Endocrinology is consulted for new-onset diabetes mellitus. Was pre-diabetic about 1 year ago. Notes when got to hospital, sugar was 295. NPO at this time. Not currently on any medications for diabetes. Family history of type 2 DM and CAD in father, multiple people with type 2 diabetes on both sides of family. Personal history of UC. No autoimmune diseases in family. For past few years, when eats high carb meals, within 10 minutes starts falling asleep. Currently on budesonide  for UC/Crohn's. Was previously on prednisone  in the past for a few years on and off. Has damage to left hip/pelvis and will need surgery in near future. No steroid injections.      MEDICAL HISTORY:   PAST MEDICAL & SURGICAL HISTORIES:   Past Medical History:   Diagnosis Date    Asthma     Bleeding hemorrhoid     Bleeding ulcer     Cervical herniated disc     COPD (chronic obstructive pulmonary disease)     GERD (gastroesophageal reflux disease)     Hx  MRSA infection     Hyperlipidemia     Hypertension     Pre-diabetes     Rotator cuff arthropathy, right     Ulcerative colitis         Past Surgical History:   Procedure Laterality Date    HX BACK SURGERY      L7 L8 repair    HX CYST REMOVAL      lower back    HX HERNIA REPAIR      HX TONSILLECTOMY              HOME MEDICATIONS:  Outpatient Medications Marked as Taking for the 10/05/23 encounter [facility] Encounter)   Medication Sig    budesonide  (ENTOCORT EC ) 3 mg Oral Capsule, Delayed & Ext.Release Take 3 Capsules (9 mg total) by mouth    cefdinir  (OMNICEF ) 300 mg Oral Capsule Take 1 Capsule (300 mg total) by mouth Every 12 hours    ciprofloxacin -dexAMETHasone  (CIPRODEX ) 0.3-0.1 % Otic Drops, Suspension instill 4 drops into affected ear(s) by otic route 2 times per day for 7 days    famotidine  (PEPCID ) 40 mg Oral Tablet Take 1 Tablet (40 mg total) by mouth    ondansetron  (ZOFRAN  ODT) 4 mg Oral Tablet, Rapid Dissolve  Take 1 Tablet (4 mg total) by mouth Every 8 hours as needed for Nausea/Vomiting       INPATIENT MEDICATIONS:    Current Facility-Administered Medications:     aluminum -magnesium  hydroxide-simethicone  (MAG-AL PLUS) 200-200-20 mg per 5 mL oral liquid, 30 mL, Oral, Q4H PRN, Bowman, Kristin D, DO    budesonide  (ENTOCORT EC ) enteric coated capsule, 9 mg, Oral, QAM, Bowman, Kristin D, DO    budesonide -formoterol  (SYMBICORT ) 160 mcg-4.5 mcg per inhalation oral inhaler - "Respiratory to administer", 2 Puff, Inhalation, 2x/day, Bowman, Kristin D, DO    cholestyramine -sucrose (QUESTRAN ) 4 gram packet, 1 Packet, Oral, Daily, Bowman, Kristin D, DO    Correction/SSIP insulin  lispro 100 units/mL injection, 1-5 Units, Subcutaneous, 4x/day AC, Bowman, Kristin D, DO    dextrose  (GLUTOSE) 40% oral gel, 15 g, Oral, Q15 Min PRN, Bowman, Kristin D, DO    dextrose  50% (0.5 g/mL) injection - syringe, 12.5 g, Intravenous, Q15 Min PRN, Bowman, Kristin D, DO    famotidine  (PEPCID ) tablet, 40 mg, Oral, Daily, Bowman, Kristin D,  DO    folic acid  (FOLVITE ) tablet, 1 mg, Oral, Daily, Bowman, Kristin D, DO    glucagon  injection 1 mg, 1 mg, IntraMUSCULAR, Once PRN, Duwaine Gins, Kristin D, DO    LORazepam  (ATIVAN ) 2 mg/mL injection, 1 mg, Intravenous, Q2H PRN, Bowman, Kristin D, DO    magnesium  hydroxide (MILK OF MAGNESIA) 400mg  per 5mL oral liquid, 30 mL, Oral, HS PRN, Duwaine Gins, Kristin D, DO    mesalamine  (DELZICOL ) capsule with delayed release tablets inside, 1,200 mg, Oral, Daily, Bowman, Kristin D, DO    nicotine  (NICODERM CQ ) transdermal patch (mg/24 hr), 21 mg, Transdermal, Daily, Prescott, Timothy, PA-C, 21 mg at 10/06/23 0308    pantoprazole  (PROTONIX ) delayed release tablet, 40 mg, Oral, Daily, Bowman, Kristin D, DO, 40 mg at 10/06/23 8469    pyridOXINE  Vitamin B6 tablet, 100 mg, Oral, Daily, Bowman, Kristin D, DO    spironolactone  (ALDACTONE ) tablet, 50 mg, Oral, Daily, Bowman, Kristin D, DO    thiamine -vitamin B1 tablet, 100 mg, Oral, Daily, Bowman, Kristin D, DO    traZODone  (DESYREL ) tablet, 50 mg, Oral, HS PRN - MR x 1, Bowman, Kristin D, DO, 50 mg at 10/05/23 2337     ALLERGIES:  Allergies   Allergen Reactions    Noctec [Chloral Hydrate] Anaphylaxis    Penicillins Anaphylaxis    Theophylline Shortness of Breath    Flexeril [Cyclobenzaprine] Mental Status Effect     Homicidal ideation    Prednisone  Myalgia    Steroids [Steroid] NO Steroids unless approved by Attending Physician        FAMILY HISTORY:  Family Medical History:       Problem Relation (Age of Onset)    Anesthesia Complications Father    Arthritis-osteo Mother, Father    Asthma Mother, Father, Brother, Paternal Grandmother, Paternal Grandfather    Blood Clots Father, Paternal Grandmother, Paternal Grandfather    Cancer Mother, Father    Congestive Heart Failure Father, Paternal Grandmother, Paternal Grandfather    Coronary Artery Disease Father, Paternal Grandmother, Paternal Grandfather    Diabetes Mother, Father    Heart Attack Father, Paternal Grandmother, Paternal  Grandfather    High Cholesterol Mother, Father    Hypertension (High Blood Pressure) Mother, Father    Stroke Father, Maternal Grandmother, Maternal Grandfather, Paternal Grandmother, Paternal Grandfather              SOCIAL HISTORY:  Social History     Tobacco Use    Smoking  status: Every Day     Current packs/day: 1.00     Average packs/day: 1 pack/day for 30.0 years (30.0 ttl pk-yrs)     Types: Cigarettes     Passive exposure: Current    Smokeless tobacco: Never   Vaping Use    Vaping status: Never Used   Substance Use Topics    Alcohol use: Yes     Alcohol/week: 35.0 standard drinks of alcohol     Types: 35 Cans of beer per week    Drug use: Never         REVIEW OF SYSTEMS:     ROS: All systems reviewed and negative except for as above.    EXAMINATION:   Vitals:   BP (!) 162/97   Pulse (!) 101   Temp 36.9 C (98.5 F)   Resp 20   Ht 1.676 m (5\' 6" )   Wt 86.2 kg (190 lb 0.9 oz)   SpO2 98%   BMI 30.68 kg/m   Full Exam Limited as Encounter is Virtual  General: Well appearing male in no acute distress.  Psychiatric: Normal mood and affect  Neuro: Alert and oriented x 3. Speech fluent. Cranial nerves grossly II-XII intact.   HEENT: Head normocephalic, atraumatic. EOMI, conjunctiva clear. No proptosis.  Thyroid /Neck: Trachea midline.   Lungs: Normal respiratory effort.   Extremities: No edema or erythema noted.  Skin: No visible rashes.    DATA REVIEWED:   I have reviewed previous labs, tests, imaging, and notes.    No results for input(s): "GLUIP" in the last 48 hours.    Lab Results   Component Value Date    HA1C 6.0 10/31/2022        Results for orders placed or performed during the hospital encounter of 10/05/23 (from the past 24 hours)   CBC/DIFF    Narrative    The following orders were created for panel order CBC/DIFF.  Procedure                               Abnormality         Status                     ---------                               -----------         ------                     CBC WITH  [id number]]                Abnormal            Final result               MANUAL DIFFERENTIAL[[id number]]          Abnormal            Final result                 Please view results for these tests on the individual orders.   BASIC METABOLIC PANEL   Result Value Ref Range    SODIUM 135 (L) 136 - 145 mmol/L    POTASSIUM 2.8 (L) 3.5 - 5.1 mmol/L    CHLORIDE 89 (L) 98 - 107 mmol/L    CO2 TOTAL 26 21 -  31 mmol/L    ANION GAP 20 (H) 4 - 13 mmol/L    CALCIUM 9.7 8.6 - 10.3 mg/dL    GLUCOSE 161 (H) 74 - 109 mg/dL    BUN 2 (L) 7 - 25 mg/dL    CREATININE 0.96 0.45 - 1.30 mg/dL    BUN/CREA RATIO 3 (L) 6 - 22    ESTIMATED GFR 119 >59 mL/min/1.81m^2    OSMOLALITY, CALCULATED 277 270 - 290 mOsm/kg    Narrative    Estimated Glomerular Filtration Rate (eGFR) is calculated using the CKD-EPI (2021) equation, intended for patients 62 years of age and older. If gender is not documented or "unknown", there will be no eGFR calculation.     AMMONIA   Result Value Ref Range    AMMONIA 38 16 - 53 umol/L   ALT (SGPT)   Result Value Ref Range    ALT (SGPT) 65 (H) 7 - 52 U/L   AST (SGOT)   Result Value Ref Range    AST (SGOT) 105 (H) 13 - 39 U/L   BILIRUBIN TOTAL   Result Value Ref Range    BILIRUBIN TOTAL 0.8 0.3 - 1.0 mg/dL   ALK PHOS (ALKALINE PHOSPHATASE)   Result Value Ref Range    ALKALINE PHOSPHATASE 180 (H) 34 - 104 U/L   GAMMA GT   Result Value Ref Range    GGT 915 (H) 9 - 64 U/L   PROTEIN TOTAL   Result Value Ref Range    PROTEIN TOTAL 7.8 6.4 - 8.9 g/dL   MAGNESIUM    Result Value Ref Range    MAGNESIUM  1.8 (L) 1.9 - 2.7 mg/dL   LIPASE   Result Value Ref Range    LIPASE 22 11 - 82 U/L   URINALYSIS, MACROSCOPIC AND MICROSCOPIC W/CULTURE REFLEX [PRN ONLY]    Specimen: Urine, Clean Catch    Narrative    The following orders were created for panel order URINALYSIS, MACROSCOPIC AND MICROSCOPIC W/CULTURE REFLEX [PRN ONLY].  Procedure                               Abnormality         Status                     ---------                                -----------         ------                     URINALYSIS, MACROSCOPIC[[id number]]      Abnormal            Final result               URINALYSIS, MICROSCOPIC[[id number]]      Normal              Final result                 Please view results for these tests on the individual orders.   CBC WITH DIFF   Result Value Ref Range    WBC 14.9 (H) 3.6 - 10.2 x10^3/uL    RBC 4.85 4.06 - 5.63 x10^6/uL    HGB 15.3 12.5 - 16.3 g/dL    HCT 40.9 81.1 - 91.4 %  MCV 94.6 73.0 - 96.2 fL    MCH 31.5 23.8 - 33.4 pg    MCHC 33.3 32.5 - 36.3 g/dL    RDW 16.1 (H) 09.6 - 16.2 %    PLATELETS 347 140 - 440 x10^3/uL    MPV 7.5 7.4 - 11.4 fL   URINALYSIS, MACROSCOPIC   Result Value Ref Range    COLOR Yellow Colorless, Light Yellow, Yellow    APPEARANCE Clear Clear    SPECIFIC GRAVITY 1.003 1.002 - 1.030    PH 6.0 5.0 - 9.0    LEUKOCYTES Negative Negative, 100  WBCs/uL    NITRITE Negative Negative    PROTEIN Negative Negative, 10 , 20  mg/dL    GLUCOSE 045 (A) Negative, 30  mg/dL    KETONES Negative Negative, Trace mg/dL    BILIRUBIN Negative Negative, 0.5 mg/dL    BLOOD Negative Negative, 0.03 mg/dL    UROBILINOGEN Normal Normal mg/dL   URINALYSIS, MICROSCOPIC   Result Value Ref Range    MUCOUS Rare Rare, Occasional, Few /hpf    RBCS 1 <4 /hpf    WBCS <1 <6 /hpf   ETHANOL, SERUM/PLASMA   Result Value Ref Range    ETHANOL 176 (H) 0 mg/dL   MANUAL DIFFERENTIAL   Result Value Ref Range    WBC 14.9 x10^3/uL    NEUTROPHIL % 59 40 - 76 %    LYMPHOCYTE % 34 25 - 45 %    MONOCYTE % 4 0 - 12 %    EOSINOPHIL % 3 0 - 7 %    BASOPHIL %      METAMYELOCYTE %      MYELOCYTE %      PROMYELOCYTE %      BAND %      BLAST %      OTHER %      NEUTROPHIL ABSOLUTE 8.79 (H) 1.80 - 8.40 x10^3/uL    LYMPHOCYTE ABSOLUTE 5.07 (H) 1.10 - 5.00 x10^3/uL    MONOCYTE ABSOLUTE 0.60 0.00 - 1.30 x10^3/uL    EOSINOPHIL ABSOLUTE 0.45 0.00 - 0.80 x10^3/uL    BASOPHIL ABSOLUTE      METAMYELOCYTE ABSOLUTE      MYELOCYTE ABSOLUTE      PROMYELOCYTE ABSOLUTE       BLAST ABSOLUTE      OTHER CELL ABSOLUTE      ANISOCYTOSIS 1+ (10-25%)     POLYCHROMASIA      POIKILOCYTOSIS      BASOPHILIC STIPPLING      MICROCYTOSIS      MACROCYTOSIS      ROULEAUX      SCHISTOCYTES      SPHEROCYTES      TARGET CELLS      TEARDROP CELLS      OVALOCYTE (ELLIPTOCYTE)      CRENATED RED CELLS      STOMATOCYTES      ACANTHOCYTES (SPUR CELL)      ECHINOCYTE (BURR CELL)      BLISTER CELLS      RBC AGGLUTINATES      HOWELL JOLLY BODIES      ATYPICAL LYMPHOCYTES      TOXIC GRANULATION      DOHLE BODIES      TOXIC VACUOLIZATION      AUER RODS      BASKET CELLS      HYPERSEGMENTATION      LARGE PLATELETS      PLATELET CLUMPS      PLATELET MORPHOLOGY COMMENT  Normal     BANDS NEUTROPHILS MANUAL      BAND ABSOLUTE      NEUTROPHILS MANUAL 59     LYMPHOCYTES MANUAL 34     MONOCYTES MANUAL 4     EOSINOPHILS MANUAL 3     BASOPHILS MANUAL      PROMYELOCYTES MANUAL      MYELOCYTES MANUAL      METAMYELOCYTES MANUAL      BLASTS MANUAL      TOTAL CELLS COUNTED [#] IN BLOOD 100     OTHER CELLS MANUAL      NUCLEATED RBC MANUAL      PLASMA CELL %      PLASMA CELL ABSOLUE      PLASMA CELLS MANUAL      HYPOCHROMASIA     ETHANOL, SERUM   Result Value Ref Range    ETHANOL 174 (H) 0 mg/dL   PT/INR   Result Value Ref Range    PROTHROMBIN TIME 12.0 9.8 - 12.7 seconds    INR 1.06 0.84 - 1.10    Narrative    In the setting of warfarin therapy, a moderate-intensity INR goal range is 2.0 to 3.0 and a high-intensity INR goal range is 2.5 to 3.5.    INR is ONLY validated to determine the level of anticoagulation with vitamin K antagonists (warfarin). Other factors may elevate the INR including but not limited to direct oral anticoagulants (DOACs), liver dysfunction, vitamin K deficiency, DIC, factor deficiencies, and factor inhibitors.   PTT (PARTIAL THROMBOPLASTIN TIME)   Result Value Ref Range    APTT 30.9 25.0 - 38.0 seconds   URINE DRUG SCREEN   Result Value Ref Range    AMPHETAMINES URINE Negative Negative    BARBITURATES URINE  Negative Negative    BENZODIAZEPINES URINE Negative Negative    BUPRENORPHINE URINE Negative Negative    CANNABINOIDS URINE Negative Negative    COCAINE METABOLITES URINE Negative Negative    FENTANYL , URINE Negative Negative    OPIATES URINE Negative Negative    OXYCODONE  URINE Negative Negative    PCP URINE Negative Negative    METHADONE URINE Negative Negative    Narrative    Any results reported as "positive" on this urine drug screen are unconfirmed screening results and should be used for medical(i.e.,treatment)purposes only. Unconfirmed screening results must not be used for non-medical purposes (e.g. employment or legal testing). Upon request, all results reported as "positive" can be sent to a reference laboratory for confirmation by GCMS.     Reporting Limits (cut-off concentrations)     Cocaine 300 ng/mL  Opiates 300 ng/mL  THC 50 ng/mL  Amphetamine 1000 ng/mL  Phencyclidine 25 ng/mL  Benzodiazepine 300 ng/mL  Barbiturates 300 ng/mL  Methadone 300 ng/mL  Oxycodone  100 ng/mL  Buprenorphine 5 ng/mL  Fentanyl  5 ng/mL     EXTRA TUBES    Narrative    The following orders were created for panel order EXTRA TUBES.  Procedure                               Abnormality         Status                     ---------                               -----------         ------  GOLD TOP ZOXW[[id number]]                                    Final result               GRAY TOP [id number]                                    Final result                 Please view results for these tests on the individual orders.   GOLD TOP TUBE   Result Value Ref Range    RAINBOW/EXTRA TUBE AUTO RESULT Yes    GRAY TOP TUBE   Result Value Ref Range    RAINBOW/EXTRA TUBE AUTO RESULT Yes    VITAMIN B12   Result Value Ref Range    VITAMIN B 12 392 180 - 914 pg/mL   FOLATE   Result Value Ref Range    FOLATE 14.2 5.9 - 24.8 ng/mL   LACTIC ACID LEVEL W/ REFLEX FOR LEVEL >2.0   Result Value Ref Range    LACTIC ACID 5.2 (HH) 0.5 -  2.2 mmol/L   BETA-HYDROXYBUTYRATE   Result Value Ref Range    BETA-HYDROXYBUTYRATE 0.13 0.02 - 0.27 mmol/L   BLOOD GAS Venous   Result Value Ref Range    %FIO2 (VENOUS) 21.0 %    PH (VENOUS) 7.46 (H) 7.32 - 7.43    PCO2 (VENOUS) 36 (L) 41 - 51 mm/Hg    PO2 (VENOUS) 165 35 - 50 mm/Hg    BICARBONATE (VENOUS) 26.5 22.0 - 29.0 mmol/L    BASE EXCESS 2.0 0.0 - 3.0 mmol/L    O2 SATURATION (VENOUS) 99.6 40.0 - 85.0 %    Narrative    Manufacturer does not recommend venous sample for assessment of patient oxygenation status. A reference range for pO2, Oxyhemoglobin and Oxygen Saturation is provided but abnormal results will not flag in Epic.   LACTIC ACID - FIRST REFLEX   Result Value Ref Range    LACTIC ACID 6.0 (HH) 0.5 - 2.2 mmol/L   LACTIC ACID - SECOND REFLEX   Result Value Ref Range    LACTIC ACID 4.6 (HH) 0.5 - 2.2 mmol/L   CBC/DIFF    Narrative    The following orders were created for panel order CBC/DIFF.  Procedure                               Abnormality         Status                     ---------                               -----------         ------                     CBC WITH [id number]]                Abnormal            Final result  Please view results for these tests on the individual orders.   BASIC METABOLIC PANEL, NON-FASTING   Result Value Ref Range    SODIUM 136 136 - 145 mmol/L    POTASSIUM 3.5 3.5 - 5.1 mmol/L    CHLORIDE 98 98 - 107 mmol/L    CO2 TOTAL 23 21 - 31 mmol/L    ANION GAP 15 (H) 4 - 13 mmol/L    CALCIUM 8.1 (L) 8.6 - 10.3 mg/dL    GLUCOSE 161 (H) 74 - 109 mg/dL    BUN 2 (L) 7 - 25 mg/dL    CREATININE 0.96 0.45 - 1.30 mg/dL    BUN/CREA RATIO 3 (L) 6 - 22    ESTIMATED GFR 120 >59 mL/min/1.3m^2    OSMOLALITY, CALCULATED 271 270 - 290 mOsm/kg    Narrative    Estimated Glomerular Filtration Rate (eGFR) is calculated using the CKD-EPI (2021) equation, intended for patients 23 years of age and older. If gender is not documented or "unknown", there will be no eGFR  calculation.     MAGNESIUM    Result Value Ref Range    MAGNESIUM  1.9 1.9 - 2.7 mg/dL   PHOSPHORUS   Result Value Ref Range    PHOSPHORUS 3.0 (L) 3.7 - 7.2 mg/dL   CBC WITH DIFF   Result Value Ref Range    WBC 12.3 (H) 3.6 - 10.2 x10^3/uL    RBC 4.35 4.06 - 5.63 x10^6/uL    HGB 13.7 12.5 - 16.3 g/dL    HCT 40.9 81.1 - 91.4 %    MCV 94.1 73.0 - 96.2 fL    MCH 31.5 23.8 - 33.4 pg    MCHC 33.5 32.5 - 36.3 g/dL    RDW 78.2 (H) 95.6 - 16.2 %    PLATELETS 303 140 - 440 x10^3/uL    MPV 7.7 7.4 - 11.4 fL    NEUTROPHIL % 61 44 - 74 %    LYMPHOCYTE % 25 15 - 43 %    MONOCYTE % 9 6 - 14 %    EOSINOPHIL % 4 1 - 8 %    BASOPHIL % 1 0 - 1 %    NEUTROPHIL # 7.50 1.70 - 7.60 x10^3/uL    LYMPHOCYTE # 3.10 1.00 - 3.20 x10^3/uL    MONOCYTE # 1.10 0.30 - 1.10 x10^3/uL    EOSINOPHIL # 0.50 0.00 - 0.50 x10^3/uL    BASOPHIL # 0.10 0.00 - 0.10 x10^3/uL       ASSESSMENT & RECOMMENDATIONS:     Rae Sutcliffe is a 48 y.o. male with PMH of pre-diabetes, asthma, HTN, HLD, COPD, GERD, ulcerative colitis/Crohn's, Barrett's esophagus, liver cirrhosis, and alcohol use admitted for lactic acidosis secondary to dehydration, alcohol use, and new-onset diabetes. Endocrinology is consulted for new-onset diabetes mellitus.     New-Onset Diabetes Mellitus with Hyperglycemia  History of Pre-Diabetes  -Most recent HgA1C 6% in 10/2022, repeat in process  -Home regimen: None  -Current inpatient regimen: Humalog  very sensitive sliding scale 4x/day PRN  -Current diet: DIET NPO - NOW STRICT  -Suspect likely type 2 DM given history of pre-diabetes and UC/Crohn's requiring steroids on and off however given history of autoimmune disorder (ulcerative colitis/Crohn's) and young age, recommend obtaining GAD, islet cell, and zinc  transporter 8 antibodies to evaluate for type 1 DM  -Highest sugar since admission was 295 on admission but sugars mostly well controlled while NPO at this time. When given diet, will wait and see how sugars do before starting  scheduled  insulin  if needed. For now, can conitnue Humalog  very sensitive sliding scale 4x/day PRN  -Will continue to monitor glucose levels at least 4 times a day and adjust insulin  doses as needed    Recommendations discussed with primary team, PRN HOSPITALIST 3.     Thank you for this consult. Will continue to follow. Please page with questions.    On the day of the encounter, a total of 50 minutes were spent on this patient encounter including review of historical information, examination, and documentation.    Delona Ferron, MD, MPH 10/06/2023, 07:17

## 2023-10-06 NOTE — OT Evaluation (Signed)
 [facility]  [address]  [place], [address]  [phone]  (Fax) [phone]  Rehabilitation Services  Occupational Therapy Inpatient Initial Evaluation      Patient Name: [name]  Date of Birth: 12-30-1975  Height: Height: 167.6 cm (5\' 6" )  Weight: Weight: 86.2 kg (190 lb 0.9 oz)  Room/Bed: 205/A  Payor: [employer] [place] / Plan: [employer] [place] MEDICAID / Product Type: Medicaid [place] /         PMH:   Past Medical History:   Diagnosis Date    Asthma     Bleeding hemorrhoid     Bleeding ulcer     Cervical herniated disc     COPD (chronic obstructive pulmonary disease)     GERD (gastroesophageal reflux disease)     Hx MRSA infection     Hyperlipidemia     Hypertension     Pre-diabetes     Rotator cuff arthropathy, right     Ulcerative colitis    .  dy ADLs due to complaints of pain related to comorbidities in his spine where he reported that he has had previous surgeries, degenerative disc disease and has poor sleep and chronic pain.  Patient expressing he feels that if he admits that he drinks in the evening that doctor's (in general not specific to current internist) no longer listened to him and do not look deeper into his medical needs to help him with pain management and sleep.  He describes his issues as cyclic because if he can not get help for pain or sleep due to comorbidities then he drinks to help keep his pain under control.  Patient reporting he was currently receiving physical therapy near his home under worker's comp and understands he will need to call his case manager and resume services ASAP once he is medically discharged from inpatient acute care.       Discharge Needs:   Equipment Recommendation:  None    The patient presents with mobility limitations due to impaired functional activity tolerance that significantly impair/prevent patient's ability to participate in mobility-related activities of daily living (MRADLs) including  ambulation and  transfers in order to safely complete, in reasonable time. This functional mobility deficit can be sufficiently resolved with the use of a Anticipated Equipment Needs at Discharge: (P) none anticipated  in order to decrease the risk of falls, morbidity, and mortality in performance of these MRADLs.  Patient is able to safely use this assistive device.    Discharge Disposition:  Home with assist    JUSTIFICATION OF DISCHARGE RECOMMENDATION   Based on current diagnosis, functional performance prior to admission, and current functional performance, this patient requires continued OT services in Anticipated Discharge Disposition: (P) home with assist  in order to achieve significant functional improvements.    Plan:   Current Intervention:  Predicted Duration of Therapy: (P) evaluation only    To provide Occupational therapy services  Therapy Frequency: (P) Evaluation Only for duration of Predicted Duration of Therapy: (P) evaluation only  .       The risks/benefits of therapy have been discussed with the patient/caregiver and he/she is in agreement with the established plan of care.       Subjective & Objective     MEDICAL HISTORY:   Past Medical History:   Diagnosis Date    Asthma     Bleeding hemorrhoid     Bleeding ulcer     Cervical herniated disc  COPD (chronic obstructive pulmonary disease)     GERD (gastroesophageal reflux disease)     Hx MRSA infection     Hyperlipidemia     Hypertension     Pre-diabetes     Rotator cuff arthropathy, right     Ulcerative colitis          SURGICAL HISTORY:   Past Surgical History:   Procedure Laterality Date    HX BACK SURGERY      L7 L8 repair    HX CYST REMOVAL      lower back    HX HERNIA REPAIR      HX TONSILLECTOMY         ipp    INSERT FLOW SHEET     10/06/23 1137   Rehab Session   Document Type evaluation   OT Visit Date 10/06/23   Total OT Minutes: 17   Patient Effort good   General Information   Patient Profile Reviewed yes   Pertinent History of Current Functional  Problem Patient admitted to ED on 4/17 with abnormal lab results and hypokalemia. PMH of asthma, COPD, GERD, HTN, pre-diabetes, and history of MRSA. OT consulted for eval.   Medical Lines PIV Line;Telemetry   Respiratory Status room air   Existing Precautions/Restrictions no known precautions/limitations   Pre Treatment Status   Pre Treatment Patient Status Patient sitting on edge of bed;Telephone within reach;Call light within reach;Nurse approved session   Support Present Pre Treatment  None   Communication Pre Treatment  Charge Nurse   Communication Pre Treatment Comment Cleared for OT   Mutuality/Individual Preferences   Anxieties, Fears or Concerns None stated by patient   Living Environment   Lives With child(ren), dependent;spouse   Living Arrangements mobile home   Self-Care   Dominant Hand right   Vital Signs   Pre-Treatment Heart Rate (beats/min) 89   Post-treatment Heart Rate (beats/min) 86   Pre SpO2 (%) 99   O2 Delivery Pre Treatment room air   Post SpO2 (%) 98   O2 Delivery Post Treatment room air   Cognition   Behavior/Mood Observations behavior appropriate to situation, WNL/WFL   Orientation Status oriented x 4   Attention WNL/WFL   Follows Commands WFL   RUE Assessment   RUE Assessment WFL- Within Functional Limits   LUE Assessment   LUE Assessment WFL- Within Functional Limits   Trunk Assessment   Trunk Assessment WFL-Within Functional Limits   Grip Strength   Grip Left (5/5) normal, left   Right Grip (5/5) normal, right   ADL Assessment/Intervention   ADL Comments Transfers: Mod Independent. Patient independent in all other ADLs. Patient participated in Phalen's Test. Patient tested positive in less than 10 seconds.   Post Treatment Status   Post Treatment Patient Status Patient sitting on edge of bed;Call light within reach;Telephone within reach   Support Present Post Treatment  None   Communication Engineer, drilling Treatment Comment Updated about patient    Clinical Impression   Criteria for Skilled Therapeutic Interventions Met (OT) yes;meets criteria;skilled treatment is necessary   Therapy Frequency Evaluation Only   Predicted Duration of Therapy evaluation only   Anticipated Equipment Needs at Discharge none anticipated   Anticipated Discharge Disposition home with assist   Evaluation Complexity Justification   Occupational Profile Review Brief history   Performance Deficits 1-3 deficits   Clinical Decision Making Low analytic complexity   Evaluation Complexity Low  TREATMENT PLAN: ADL/IADL TRAINING  EVALUATION COMPLEXITY: CLINICAL DECISION MAKING OF LOW COMPLEXITY AS INDICATED BY PMH, OCCUPATIONAL THERAPY ASSESSMENT OF MUSCULOSKELETAL AND NEUROLOGICAL SYSTEMS AND ACTIVITY LIMITATIONS. CLINICAL PRESENTATION IS STABLE AND UNCOMPLICATED.      EVALUATION 17 minutes    Therapist:      Lennon Race, OT,10/06/2023 12:46

## 2023-10-06 NOTE — Respiratory Therapy (Signed)
 Smoking cessation education completed at this time. Left pamphlet with Pt.

## 2023-10-06 NOTE — Nurses Notes (Signed)
 New orders for Tylenol .  Informed patient that he had tylenol . He stated that like eating candy.  Press photographer and doctor notified.

## 2023-10-06 NOTE — Care Plan (Signed)
 Pt admitted with hypokalemia. Potassium replaced and now 3.5. Pt is on CIWA for a history of ETOH abuse. Labs and telemetry monitored   Problem: Electrolyte Imbalance  Goal: Electrolyte Balance  Outcome: Ongoing (see interventions/notes)     Problem: Adult Inpatient Plan of Care  Goal: Plan of Care Review  Outcome: Ongoing (see interventions/notes)  Goal: Patient-Specific Goal (Individualized)  Outcome: Ongoing (see interventions/notes)  Goal: Absence of Hospital-Acquired Illness or Injury  Outcome: Ongoing (see interventions/notes)  Goal: Optimal Comfort and Wellbeing  Outcome: Ongoing (see interventions/notes)  Goal: Rounds/Family Conference  Outcome: Ongoing (see interventions/notes)     Problem: Fall Injury Risk  Goal: Absence of Fall and Fall-Related Injury  Outcome: Ongoing (see interventions/notes)

## 2023-10-06 NOTE — Nurses Notes (Addendum)
 Patient complaining of pain all over.  (Back, knees, neck). At a 7 on pain scale. No PRN medications for pain. Press photographer and Doctor notified.  No new orders at this time.

## 2023-10-06 NOTE — Progress Notes (Signed)
 [facility]  IP PROGRESS NOTE      [name], [name]  Date of Admission:  10/05/2023  Date of Birth:  Nov 07, 1975  Date of Service:  10/06/2023    Hospital Day:  LOS: 1 day     History of Present Illness  [name] is a 48 y.o. male seen and examined in follow up for lactic acidosis, new onset diabetes, alcohol abuse, cirrhosis, chronic obstructive pulmonary disease, hypertension, hypokalemia, hypomagnesemia, dehydration, avascular necrosis of the hips, ear pain.  Patient is sitting up on side of bed.  Denies any new current concerns.  Review of Systems  Other than ROS in the HPI, all other systems were negative    Vital Signs:  Temp (24hrs) Max:36.9 C (98.5 F)      Temperature: 36.7 C (98 F)  BP (Non-Invasive): (!) 154/98  MAP (Non-Invasive): 115 mmHG  Heart Rate: 88  Respiratory Rate: 18  SpO2: 95 %    Current Medications:  acetaminophen  (TYLENOL ) tablet, 650 mg, Oral, Q4H PRN  aluminum -magnesium  hydroxide-simethicone  (MAG-AL PLUS) 200-200-20 mg per 5 mL oral liquid, 30 mL, Oral, Q4H PRN  budesonide  (ENTOCORT EC ) enteric coated capsule, 9 mg, Oral, QAM  budesonide -formoterol  (SYMBICORT ) 160 mcg-4.5 mcg per inhalation oral inhaler - "Respiratory to administer", 2 Puff, Inhalation, 2x/day  cholestyramine -sucrose (QUESTRAN ) 4 gram packet, 1 Packet, Oral, Daily  ciprofloxacin -dexAMETHasone  (CIPRODEX ) 0.3%-0.1% otic suspension, 4 Drop, Both Ears, 2x/day  Correction/SSIP insulin  lispro 100 units/mL injection, 1-5 Units, Subcutaneous, 4x/day AC  dextrose  (GLUTOSE) 40% oral gel, 15 g, Oral, Q15 Min PRN  dextrose  50% (0.5 g/mL) injection - syringe, 12.5 g, Intravenous, Q15 Min PRN  famotidine  (PEPCID ) tablet, 40 mg, Oral, Daily  folic acid  (FOLVITE ) tablet, 1 mg, Oral, Daily  glucagon  injection 1 mg, 1 mg, IntraMUSCULAR, Once PRN  LORazepam  (ATIVAN ) 2 mg/mL injection, 1 mg, Intravenous, Q2H PRN  magnesium  hydroxide (MILK OF MAGNESIA) 400mg  per 5mL oral liquid, 30 mL, Oral, HS  PRN  mesalamine  (DELZICOL ) capsule with delayed release tablets inside, 1,200 mg, Oral, Daily  multivitamin (THERA) tablet, 1 Tablet, Oral, Daily  nicotine  (NICODERM CQ ) transdermal patch (mg/24 hr), 21 mg, Transdermal, Daily  NS premix infusion, , Intravenous, Continuous  oxyCODONE  (ROXICODONE ) immediate release tablet, 5 mg, Oral, Q6H PRN  pantoprazole  (PROTONIX ) delayed release tablet, 40 mg, Oral, Daily  pyridOXINE  Vitamin B6 tablet, 100 mg, Oral, Daily  spironolactone  (ALDACTONE ) tablet, 50 mg, Oral, Daily  thiamine -vitamin B1 tablet, 100 mg, Oral, Daily  traZODone  (DESYREL ) tablet, 50 mg, Oral, HS PRN - MR x 1        Current Orders:  Active Orders   Lab    CBC     Frequency: ONE TIME     Number of Occurrences: 1 Occurrences    CBC     Frequency: ONE TIME     Number of Occurrences: 1 Occurrences    CBC     Frequency: ONE TIME     Number of Occurrences: 1 Occurrences    COMPREHENSIVE METABOLIC PANEL, NON-FASTING     Frequency: ONE TIME     Number of Occurrences: 1 Occurrences    COMPREHENSIVE METABOLIC PANEL, NON-FASTING     Frequency: ONE TIME     Number of Occurrences: 1 Occurrences    COMPREHENSIVE METABOLIC PANEL, NON-FASTING     Frequency: ONE TIME     Number of Occurrences: 1 Occurrences    GAD65 AB ASSAY, S     Frequency: 0530 - AM DRAW  Number of Occurrences: 1 Occurrences    Islet Cell Antibody Screen W/Reflex To Titer     Frequency: 0530 - AM DRAW     Number of Occurrences: 1 Occurrences    MAGNESIUM      Frequency: ONE TIME     Number of Occurrences: 1 Occurrences    MAGNESIUM      Frequency: ONE TIME     Number of Occurrences: 1 Occurrences    MAGNESIUM      Frequency: ONE TIME     Number of Occurrences: 1 Occurrences    Zinc Transporter 8 (Znt8) Antibody     Frequency: 0530 - AM DRAW     Number of Occurrences: 1 Occurrences   Diet    DIET DIABETIC Calorie amount: CC 2200; Do you want to initiate MNT Protocol? Yes     Frequency: All Meals     Number of Occurrences: 1 Occurrences   Nursing    APPLY  SEQUENTIAL COMPRESSION DEVICE     Frequency: ONE TIME     Number of Occurrences: 1 Occurrences    HYPOGLYCEMIA MANAGEMENT - CONSCIOUS PATIENT W/DIET ORDER     Frequency: UNTIL DISCONTINUED     Number of Occurrences: Until Specified    HYPOGLYCEMIA MANAGEMENT - UNCONSCIOUS/ALTERED/NPO PATIENT     Frequency: UNTIL DISCONTINUED     Number of Occurrences: Until Specified    HYPOGLYCEMIA TREATMENT ALGORITHM     Frequency: UNTIL DISCONTINUED     Number of Occurrences: Until Specified    INTAKE AND OUTPUT Q4H     Frequency: Q4H     Number of Occurrences: Until Specified    MAINTAIN SEQUENTIAL COMPRESSION DEVICE     Frequency: CONTINUOUS     Number of Occurrences: Until Specified    Notify MD Vital Signs     Frequency: PRN     Number of Occurrences: Until Specified    NURSE TO ENTER SECONDARY ORDER Other - (specify in comments) (CHEST PAIN AND/OR ARRYTHMIA)     Frequency: UNTIL DISCONTINUED     Number of Occurrences: Until Specified    PT IS INTERMEDIATE RISK FOR VENOUS THROMBOEMBOLISM     Frequency: CONTINUOUS     Number of Occurrences: Until Specified    PULSE OXIMETRY Q4H     Frequency: Q4H     Number of Occurrences: Until Specified    TELEMETRY MONITORING - Continuous     Frequency: CONTINUOUS     Number of Occurrences: Until Specified    VITAL SIGNS  Q2H     Frequency: Q2H     Number of Occurrences: 250 Occurrences   Consult    IP CONSULT TO CARE MANAGEMENT     Frequency: ONE TIME     Number of Occurrences: 1 Occurrences    IP CONSULT TO ENDOCRINE/METABOLIC - TELEMEDICINE     Frequency: ONE TIME     Number of Occurrences: 1 Occurrences    IP CONSULT TO ORTHOPEDICS On-Call Provider (nurse/clerk to determine)     Frequency: ONE TIME     Number of Occurrences: 1 Occurrences    IP CONSULT TO PATIENT EDUCATION     Frequency: ONE TIME     Number of Occurrences: 1 Occurrences   OT    OT EVAL & TREAT     Frequency: Per Therapist Discretion     Number of Occurrences: 1 Occurrences     Scheduling Instructions:               PT     PT  EVALUATE AND TREAT     Frequency: Per Therapist Discretion     Number of Occurrences: 1 Occurrences     Order Comments: If patient's O2 level drops, PT may increase O2 until sats are > 93%.       Scheduling Instructions:               [facility]    CIWA-AR ASSESSMENT     Frequency: Q2H     Number of Occurrences: Until Specified    CIWA-AR ASSESSMENT     Frequency: Q4H     Number of Occurrences: Until Specified   Point of Care Testing    PERFORM POC WHOLE BLOOD GLUCOSE     Frequency: TID AC & HS     Number of Occurrences: Until Specified   Medications    acetaminophen  (TYLENOL ) tablet     Frequency: Q4H PRN     Dose: 650 mg     Route: Oral    aluminum -magnesium  hydroxide-simethicone  (MAG-AL PLUS) 200-200-20 mg per 5 mL oral liquid     Frequency: Q4H PRN     Dose: 30 mL     Route: Oral    budesonide  (ENTOCORT EC ) enteric coated capsule     Frequency: QAM     Dose: 9 mg     Route: Oral    budesonide -formoterol  (SYMBICORT ) 160 mcg-4.5 mcg per inhalation oral inhaler - "Respiratory to administer"     Frequency: 2x/day     Dose: 2 Puff     Route: Inhalation    cholestyramine -sucrose (QUESTRAN ) 4 gram packet     Frequency: Daily     Dose: 1 Packet     Route: Oral    ciprofloxacin -dexAMETHasone  (CIPRODEX ) 0.3%-0.1% otic suspension     Frequency: 2x/day     Dose: 4 Drop     Route: Both Ears    Correction/SSIP insulin  lispro 100 units/mL injection     Frequency: 4x/day AC     Dose: 1-5 Units     Route: Subcutaneous    dextrose  (GLUTOSE) 40% oral gel     Frequency: Q15 Min PRN     Dose: 15 g     Route: Oral    dextrose  50% (0.5 g/mL) injection - syringe     Frequency: Q15 Min PRN     Dose: 12.5 g     Route: Intravenous    famotidine  (PEPCID ) tablet     Frequency: Daily     Dose: 40 mg     Route: Oral    folic acid  (FOLVITE ) tablet     Frequency: Daily     Dose: 1 mg     Route: Oral    glucagon  injection 1 mg     Frequency: Once PRN     Dose: 1 mg     Route: IntraMUSCULAR    LORazepam  (ATIVAN ) 2 mg/mL  injection     Frequency: Q2H PRN     Dose: 1 mg     Route: Intravenous    magnesium  hydroxide (MILK OF MAGNESIA) 400mg  per 5mL oral liquid     Frequency: HS PRN     Dose: 30 mL     Route: Oral    mesalamine  (DELZICOL ) capsule with delayed release tablets inside     Frequency: Daily     Dose: 1,200 mg     Route: Oral    multivitamin (THERA) tablet     Frequency: Daily     Dose: 1 Tablet  Route: Oral    nicotine  (NICODERM CQ ) transdermal patch (mg/24 hr)     Frequency: Daily     Dose: 21 mg     Route: Transdermal    NS premix infusion     Frequency: Continuous     Route: Intravenous    oxyCODONE  (ROXICODONE ) immediate release tablet     Frequency: Q6H PRN     Dose: 5 mg     Route: Oral    pantoprazole  (PROTONIX ) delayed release tablet     Frequency: Daily     Dose: 40 mg     Route: Oral    pyridOXINE  Vitamin B6 tablet     Frequency: Daily     Dose: 100 mg     Route: Oral    spironolactone  (ALDACTONE ) tablet     Frequency: Daily     Dose: 50 mg     Route: Oral    thiamine -vitamin B1 tablet     Frequency: Daily     Dose: 100 mg     Route: Oral    traZODone  (DESYREL ) tablet     Frequency: HS PRN - MR x 1     Dose: 50 mg     Route: Oral        Today's Physical Exam:  Physical Exam  HENT:      Head: Normocephalic.      Ears:      Comments: Both external canals edematous and erythematous, tympanic membranes bilaterally mildly erythematous with poor light reflex mild bulging.       Mouth/Throat:      Mouth: Mucous membranes are moist.   Cardiovascular:      Rate and Rhythm: Normal rate and regular rhythm.      Pulses: Normal pulses.      Heart sounds: Normal heart sounds.   Pulmonary:      Effort: Pulmonary effort is normal.      Breath sounds: Normal breath sounds.   Abdominal:      General: Bowel sounds are normal.      Palpations: Abdomen is soft.   Musculoskeletal:         General: Normal range of motion.   Skin:     General: Skin is warm.      Capillary Refill: Capillary refill takes less than 2 seconds.    Neurological:      Mental Status: He is alert and oriented to person, place, and time.   Psychiatric:         Behavior: Behavior normal.          Consults:  Endocrinology, Orthopedics, patient education, case management    I/O:  I/O last 24 hours:    Intake/Output Summary (Last 24 hours) at 10/06/2023 1355  Last data filed at 10/06/2023 0956  Gross per 24 hour   Intake 2500 ml   Output 500 ml   Net 2000 ml     I/O current shift:  04/18 0700 - 04/18 1859  In: -   Out: 500 [Urine:500]      Labs  Please indicate ordered or reviewed)  Reviewed: Lab Results Today:    Results for orders placed or performed during the hospital encounter of 10/05/23 (from the past 24 hours)   BASIC METABOLIC PANEL   Result Value Ref Range    SODIUM 135 (L) 136 - 145 mmol/L    POTASSIUM 2.8 (L) 3.5 - 5.1 mmol/L    CHLORIDE 89 (L) 98 - 107 mmol/L    CO2  TOTAL 26 21 - 31 mmol/L    ANION GAP 20 (H) 4 - 13 mmol/L    CALCIUM 9.7 8.6 - 10.3 mg/dL    GLUCOSE 161 (H) 74 - 109 mg/dL    BUN 2 (L) 7 - 25 mg/dL    CREATININE 0.96 0.45 - 1.30 mg/dL    BUN/CREA RATIO 3 (L) 6 - 22    ESTIMATED GFR 119 >59 mL/min/1.64m^2    OSMOLALITY, CALCULATED 277 270 - 290 mOsm/kg   AMMONIA   Result Value Ref Range    AMMONIA 38 16 - 53 umol/L   ALT (SGPT)   Result Value Ref Range    ALT (SGPT) 65 (H) 7 - 52 U/L   AST (SGOT)   Result Value Ref Range    AST (SGOT) 105 (H) 13 - 39 U/L   BILIRUBIN TOTAL   Result Value Ref Range    BILIRUBIN TOTAL 0.8 0.3 - 1.0 mg/dL   ALK PHOS (ALKALINE PHOSPHATASE)   Result Value Ref Range    ALKALINE PHOSPHATASE 180 (H) 34 - 104 U/L   GAMMA GT   Result Value Ref Range    GGT 915 (H) 9 - 64 U/L   PROTEIN TOTAL   Result Value Ref Range    PROTEIN TOTAL 7.8 6.4 - 8.9 g/dL   MAGNESIUM    Result Value Ref Range    MAGNESIUM  1.8 (L) 1.9 - 2.7 mg/dL   LIPASE   Result Value Ref Range    LIPASE 22 11 - 82 U/L   CBC WITH DIFF   Result Value Ref Range    WBC 14.9 (H) 3.6 - 10.2 x10^3/uL    RBC 4.85 4.06 - 5.63 x10^6/uL    HGB 15.3 12.5 - 16.3 g/dL     HCT 40.9 81.1 - 91.4 %    MCV 94.6 73.0 - 96.2 fL    MCH 31.5 23.8 - 33.4 pg    MCHC 33.3 32.5 - 36.3 g/dL    RDW 78.2 (H) 95.6 - 16.2 %    PLATELETS 347 140 - 440 x10^3/uL    MPV 7.5 7.4 - 11.4 fL   ETHANOL, SERUM/PLASMA   Result Value Ref Range    ETHANOL 176 (H) 0 mg/dL   MANUAL DIFFERENTIAL   Result Value Ref Range    WBC 14.9 x10^3/uL    NEUTROPHIL % 59 40 - 76 %    LYMPHOCYTE % 34 25 - 45 %    MONOCYTE % 4 0 - 12 %    EOSINOPHIL % 3 0 - 7 %    BASOPHIL %      METAMYELOCYTE %      MYELOCYTE %      PROMYELOCYTE %      BAND %      BLAST %      OTHER %      NEUTROPHIL ABSOLUTE 8.79 (H) 1.80 - 8.40 x10^3/uL    LYMPHOCYTE ABSOLUTE 5.07 (H) 1.10 - 5.00 x10^3/uL    MONOCYTE ABSOLUTE 0.60 0.00 - 1.30 x10^3/uL    EOSINOPHIL ABSOLUTE 0.45 0.00 - 0.80 x10^3/uL    BASOPHIL ABSOLUTE      METAMYELOCYTE ABSOLUTE      MYELOCYTE ABSOLUTE      PROMYELOCYTE ABSOLUTE      BLAST ABSOLUTE      OTHER CELL ABSOLUTE      ANISOCYTOSIS 1+ (10-25%)     POLYCHROMASIA      POIKILOCYTOSIS      BASOPHILIC STIPPLING  MICROCYTOSIS      MACROCYTOSIS      ROULEAUX      SCHISTOCYTES      SPHEROCYTES      TARGET CELLS      TEARDROP CELLS      OVALOCYTE (ELLIPTOCYTE)      CRENATED RED CELLS      STOMATOCYTES      ACANTHOCYTES (SPUR CELL)      ECHINOCYTE (BURR CELL)      BLISTER CELLS      RBC AGGLUTINATES      HOWELL JOLLY BODIES      ATYPICAL LYMPHOCYTES      TOXIC GRANULATION      DOHLE BODIES      TOXIC VACUOLIZATION      AUER RODS      BASKET CELLS      HYPERSEGMENTATION      LARGE PLATELETS      PLATELET CLUMPS      PLATELET MORPHOLOGY COMMENT Normal     BANDS NEUTROPHILS MANUAL      BAND ABSOLUTE      NEUTROPHILS MANUAL 59     LYMPHOCYTES MANUAL 34     MONOCYTES MANUAL 4     EOSINOPHILS MANUAL 3     BASOPHILS MANUAL      PROMYELOCYTES MANUAL      MYELOCYTES MANUAL      METAMYELOCYTES MANUAL      BLASTS MANUAL      TOTAL CELLS COUNTED [#] IN BLOOD 100     OTHER CELLS MANUAL      NUCLEATED RBC MANUAL      PLASMA CELL %      PLASMA CELL  ABSOLUE      PLASMA CELLS MANUAL      HYPOCHROMASIA     ETHANOL, SERUM   Result Value Ref Range    ETHANOL 174 (H) 0 mg/dL   VITAMIN A21   Result Value Ref Range    VITAMIN B 12 392 180 - 914 pg/mL   FOLATE   Result Value Ref Range    FOLATE 14.2 5.9 - 24.8 ng/mL   BETA-HYDROXYBUTYRATE   Result Value Ref Range    BETA-HYDROXYBUTYRATE 0.13 0.02 - 0.27 mmol/L   HGA1C (HEMOGLOBIN A1C WITH EST AVG GLUCOSE)   Result Value Ref Range    HEMOGLOBIN A1C 7.3 (H) 4.0 - 6.0 %   PT/INR   Result Value Ref Range    PROTHROMBIN TIME 12.0 9.8 - 12.7 seconds    INR 1.06 0.84 - 1.10   PTT (PARTIAL THROMBOPLASTIN TIME)   Result Value Ref Range    APTT 30.9 25.0 - 38.0 seconds   GOLD TOP TUBE   Result Value Ref Range    RAINBOW/EXTRA TUBE AUTO RESULT Yes    GRAY TOP TUBE   Result Value Ref Range    RAINBOW/EXTRA TUBE AUTO RESULT Yes    URINALYSIS, MACROSCOPIC   Result Value Ref Range    COLOR Yellow Colorless, Light Yellow, Yellow    APPEARANCE Clear Clear    SPECIFIC GRAVITY 1.003 1.002 - 1.030    PH 6.0 5.0 - 9.0    LEUKOCYTES Negative Negative, 100  WBCs/uL    NITRITE Negative Negative    PROTEIN Negative Negative, 10 , 20  mg/dL    GLUCOSE 308 (A) Negative, 30  mg/dL    KETONES Negative Negative, Trace mg/dL    BILIRUBIN Negative Negative, 0.5 mg/dL    BLOOD Negative Negative, 0.03 mg/dL  UROBILINOGEN Normal Normal mg/dL   URINALYSIS, MICROSCOPIC   Result Value Ref Range    MUCOUS Rare Rare, Occasional, Few /hpf    RBCS 1 <4 /hpf    WBCS <1 <6 /hpf   URINE DRUG SCREEN   Result Value Ref Range    AMPHETAMINES URINE Negative Negative    BARBITURATES URINE Negative Negative    BENZODIAZEPINES URINE Negative Negative    BUPRENORPHINE URINE Negative Negative    CANNABINOIDS URINE Negative Negative    COCAINE METABOLITES URINE Negative Negative    FENTANYL , URINE Negative Negative    OPIATES URINE Negative Negative    OXYCODONE  URINE Negative Negative    PCP URINE Negative Negative    METHADONE URINE Negative Negative   LACTIC ACID  LEVEL W/ REFLEX FOR LEVEL >2.0   Result Value Ref Range    LACTIC ACID 5.2 (HH) 0.5 - 2.2 mmol/L   BLOOD GAS Venous   Result Value Ref Range    %FIO2 (VENOUS) 21.0 %    PH (VENOUS) 7.46 (H) 7.32 - 7.43    PCO2 (VENOUS) 36 (L) 41 - 51 mm/Hg    PO2 (VENOUS) 165 35 - 50 mm/Hg    BICARBONATE (VENOUS) 26.5 22.0 - 29.0 mmol/L    BASE EXCESS 2.0 0.0 - 3.0 mmol/L    O2 SATURATION (VENOUS) 99.6 40.0 - 85.0 %   LACTIC ACID - FIRST REFLEX   Result Value Ref Range    LACTIC ACID 6.0 (HH) 0.5 - 2.2 mmol/L   LACTIC ACID - SECOND REFLEX   Result Value Ref Range    LACTIC ACID 4.6 (HH) 0.5 - 2.2 mmol/L   BASIC METABOLIC PANEL, NON-FASTING   Result Value Ref Range    SODIUM 136 136 - 145 mmol/L    POTASSIUM 3.5 3.5 - 5.1 mmol/L    CHLORIDE 98 98 - 107 mmol/L    CO2 TOTAL 23 21 - 31 mmol/L    ANION GAP 15 (H) 4 - 13 mmol/L    CALCIUM 8.1 (L) 8.6 - 10.3 mg/dL    GLUCOSE 161 (H) 74 - 109 mg/dL    BUN 2 (L) 7 - 25 mg/dL    CREATININE 0.96 0.45 - 1.30 mg/dL    BUN/CREA RATIO 3 (L) 6 - 22    ESTIMATED GFR 120 >59 mL/min/1.78m^2    OSMOLALITY, CALCULATED 271 270 - 290 mOsm/kg   MAGNESIUM    Result Value Ref Range    MAGNESIUM  1.9 1.9 - 2.7 mg/dL   PHOSPHORUS   Result Value Ref Range    PHOSPHORUS 3.0 (L) 3.7 - 7.2 mg/dL   CBC WITH DIFF   Result Value Ref Range    WBC 12.3 (H) 3.6 - 10.2 x10^3/uL    RBC 4.35 4.06 - 5.63 x10^6/uL    HGB 13.7 12.5 - 16.3 g/dL    HCT 40.9 81.1 - 91.4 %    MCV 94.1 73.0 - 96.2 fL    MCH 31.5 23.8 - 33.4 pg    MCHC 33.5 32.5 - 36.3 g/dL    RDW 78.2 (H) 95.6 - 16.2 %    PLATELETS 303 140 - 440 x10^3/uL    MPV 7.7 7.4 - 11.4 fL    NEUTROPHIL % 61 44 - 74 %    LYMPHOCYTE % 25 15 - 43 %    MONOCYTE % 9 6 - 14 %    EOSINOPHIL % 4 1 - 8 %    BASOPHIL % 1 0 - 1 %  NEUTROPHIL # 7.50 1.70 - 7.60 x10^3/uL    LYMPHOCYTE # 3.10 1.00 - 3.20 x10^3/uL    MONOCYTE # 1.10 0.30 - 1.10 x10^3/uL    EOSINOPHIL # 0.50 0.00 - 0.50 x10^3/uL    BASOPHIL # 0.10 0.00 - 0.10 x10^3/uL   POC BLOOD GLUCOSE (RESULTS)   Result Value Ref Range     GLUCOSE, POC 179 (H) 70 - 100 mg/dl   LACTIC ACID LEVEL W/ REFLEX FOR LEVEL >2.0   Result Value Ref Range    LACTIC ACID 3.5 (H) 0.5 - 2.2 mmol/L   LIGHT GREEN TOP TUBE   Result Value Ref Range    RAINBOW/EXTRA TUBE AUTO RESULT Yes    LAVENDER TOP TUBE   Result Value Ref Range    RAINBOW/EXTRA TUBE AUTO RESULT Yes    LACTIC ACID - FIRST REFLEX   Result Value Ref Range    LACTIC ACID 3.0 (H) 0.5 - 2.2 mmol/L   POC BLOOD GLUCOSE (RESULTS)   Result Value Ref Range    GLUCOSE, POC 181 (H) 70 - 100 mg/dl       Diagnostic Tests (Please indicate ordered or reviewed)  Reviewed: I reviewed all new diagnostic tests.    Assessment/ Plan:   Active Hospital Problems   (*Primary Problem)    Diagnosis    *Hypokalemia    Diabetes mellitus, new onset (CMS HCC)    Cirrhosis    Alcohol abuse    Ulcerative colitis    Continuous tobacco abuse    COPD (chronic obstructive pulmonary disease)    HTN (hypertension)    Lactic acidosis     Otitis media/otitis externa  Cipro  ear drops bilaterally  Omnicef  continued    Lactic acidosis  Possibly secondary to dehydration alcohol abuse and new onset diabetes  Continue IV fluids and hydration    New onset diabetes  Hemoglobin A1c 7.3  Diabetic education requested  Tele endocrinology consulted    Alcohol abuse  CIWA protocol  Thiamine  folic acid   Educated patient needs cessation of alcohol abuse    Cirrhosis  We will need outpatient follow up with Dr. Alyssa Backbone  Liver ultrasound  IV hydration    Ulcerative colitis-chronic  Continue home medication    Tobacco abuse  Nicotine  patch if needed  Advised to stop smoking    Chronic obstructive pulmonary disease  Continue p.r.n. albuterol     Hypertension  Home medications continued  Monitor on telemetry    Hypokalemia/hypomagnesemia  Possibly secondary to alcohol abuse and poor nutrition  Replacement given  Repeat labs in a.m.    Chronic back pain  We will need outpatient pain specialist    Nutrition:    DIET DIABETIC Calorie amount: CC 2200; Do you want  to initiate MNT Protocol? Yes    Additional clinical characteristics related to nutrition:    - monitor for weight changes   - monitor intake and output    - monitor bowel functions        The Hospitalist personally evaluated and examined the patient in conjunction with the MLP and agree with the assessments, treatment plan and disposition of the patient as recorded by the [facility].     Pennie Goins, FNP-BC      DVT/PE Prophylaxis: SCDs/ Venodynes/Impulse boots    Disposition Planning: Home discharge     Patient seen and examined bedside.     Patient stated he had sore throat, and infection in the year for which he followed up with PCP he also  had lumps/lymph nodes under his chin which were inflamed, patient was prescribed cefdinir  and ciprofloxacin  drops for the ears with Decadron  drops.  Patient's left nodes had markedly improved.  Patient's sore throat is resolved.  Patient is still having some pain in the ears.  Patient's abdomen is distended, patient is aware about his enlarged liver.  Patient also complained of red spots which appeared on his arms bilaterally which seem consistent with spider angiomata.  Patient drinks alcohol 4-5 beers per day for the last 15 years.     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  Did not show liver cirrhosis.  Start normal saline at 75 cc an hour.  Check CRP.  Check hepatitis panel.  Start oxycodone  5 mg prn q.6 hours.  Potassium improved.  Continue cefdinir  for history of sore throat and ciprofloxacin  and Decadron  for otitis externa.  Continue thiamine  folic acid  and multivitamin.  Continue Pepcid .  Continue Aldactone  50 mg daily.  CIWA  protocol in place.  Orthopedics consulted for AVN with fracture.  Endocrine on board for new onset diabetes mellitus.  Appreciate input.  PTOT/he has management.    Analea Muller, MD

## 2023-10-06 NOTE — PT Evaluation (Signed)
 Aaron A[facility]  [address]  [place], [address]  [phone]  (Fax) [phone]  Rehabilitation Services  Physical Therapy Inpatient Initial Evaluation    Patient Name: [name]  Date of Birth: 05-Feb-1976  Height: Height: 167.6 cm (5\' 6" )  Weight: Weight: 86.2 kg (190 lb 0.9 oz)  Room/Bed: 205/A  Payor: [employer] [place] / Plan: [employer] [place] MEDICAID / Product Type: Medicaid [place] /       PMH:  Past Medical History:   Diagnosis Date    Asthma     Bleeding hemorrhoid     Bleeding ulcer     Cervical herniated disc     COPD (chronic obstructive pulmonary disease)     GERD (gastroesophageal reflux disease)     Hx MRSA infection     Hyperlipidemia     Hypertension     Pre-diabetes     Rotator cuff arthropathy, right     Ulcerative colitis            Assessment:      (P) Pt presents with reduced activity tolerance but related to left hip pain and low back surgery about 1 month ago. Pt is able to amb with right SPC wtih some antalgic gait pattern but without LOB or increased back pain. Reviewed back/spinal precautions emphasis to no twisting. Continue using SPC, denies any new DME needs. Resume OPT once DCd.    Total Distance Ambulated: (P) 100  Independence: (P) modified independence  Assistive Device: (P) straight cane      Discharge Needs:    Equipment Recommendation: (P) none anticipated      The patient DOES NOT present with mobility limitations that significantly impair/prevent patient's ability to participate in mobility-related activities of daily living (MRADLs).     Discharge Disposition: (P) home with assist, home with outpatient services    JUSTIFICATION OF DISCHARGE RECOMMENDATION   Based on current diagnosis, functional performance prior to admission, and current functional performance, this patient DOES NOT require continued PT services at this time DURING HOSPITALIZATION. See discharge disposition above.      Plan:   Evaluation only.  Recommend continued  ambulation with Nursing supervision/Restorative Aides upon clearance by charge/attending nurse each session.    The risks/benefits of therapy have been discussed with the patient/caregiver and he/she is in agreement with the established plan of care.       Subjective & Objective     Past Medical History:   Diagnosis Date    Asthma     Bleeding hemorrhoid     Bleeding ulcer     Cervical herniated disc     COPD (chronic obstructive pulmonary disease)     GERD (gastroesophageal reflux disease)     Hx MRSA infection     Hyperlipidemia     Hypertension     Pre-diabetes     Rotator cuff arthropathy, right     Ulcerative colitis             Past Surgical History:   Procedure Laterality Date    HX BACK SURGERY      L7 L8 repair    HX CYST REMOVAL      lower back    HX HERNIA REPAIR      HX TONSILLECTOMY                   10/06/23 1540   Rehab Session   Document Type evaluation   PT Visit Date 10/06/23  General Information   Patient Profile Reviewed yes   Pertinent History of Current Functional Problem Pt is a 47-YO male ADM for hypokalemia. PMH of COPD, GERD, prediabetes, alcohol abuse, tobacco use. Relates hx of recent low back surgery in the lumbar vert about one month ago. Was getting Outpatient PT 2x a week until he got hosp.   Medical Lines Telemetry;PIV Line   Respiratory Status room air   Existing Precautions/Restrictions no known precautions/limitations   Mutuality/Individual Preferences   Anxieties, Fears or Concerns none voiced   Individualized Care Needs up to bathroom with cane   Patient-Specific Goals (Include Timeframe) DC to home when clear   Plan of Care Reviewed With patient   Living Environment   Lives With child(ren), dependent;spouse   Living Arrangements mobile home   Home Assessment: No Problems Identified   Home Accessibility no concerns;stairs to enter home   Functional Level Prior   Ambulation 1 - assistive equipment  (spc)   Transferring 1 - assistive equipment   Toileting 0 - independent    Bathing 0 - independent   Dressing 0 - independent   Eating 0 - independent   Communication 0 - understands/communicates without difficulty   Swallowing 0-->swallows foods/liquids without difficulty   Prior Functional Level Comment Pt amb with cane for his low back and left hip pains, relays he was told that 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   Pre Treatment Status   Pre Treatment Patient Status Patient sitting on edge of bed;Call light within reach   Support Present Pre Treatment  None   Communication Pre Treatment  Nurse   Communication Pre Treatment Comment clear   Cognitive Assessment/Interventions   Behavior/Mood Observations alert;behavior appropriate to situation, WNL/WFL;cooperative   Orientation Status oriented x 4   Attention WNL/WFL   Follows Commands WFL   Pre- Treatment Vital Signs   Pre-Treatment Heart Rate (beats/min) 84   Pre SpO2 (%) 96   O2 Delivery Pre Treatment room air   Pre-Treatment Pain   Pretreatment Pain Rating 7/10   Pre/Posttreatment Pain Comment does not go any lower than 7   RUE Assessment   RUE Assessment WFL- Within Functional Limits   LUE Assessment   LUE Assessment WFL- Within Functional Limits   RLE Assessment   RLE Assessment WFL- Within Functional Limits   LLE Assessment   LLE Assessment X-Exceptions   LLE Strength 4-/5   Trunk Assessment   Trunk Assessment WFL-Within Functional Limits   Mobility Assessment/Training   Additional Documentation Transfer Assessment/Treatment (Group);Gait Assessment/Treatment (Group)   Transfer Assessment/Treatment   Sit-Stand Independence modified independence   Sit-Stand-Sit, Assist Device None   Bed-Chair Independence modified independence   Bed-Chair-Bed Assist Device straight cane   Gait Assessment/Treatment   Total Distance Ambulated 100   Independence  modified independence   Assistive Device  straight cane   Gait Speed fair   Deviations  swing-to-stance ratio decreased;limb motion velocity decreased   Impairments  pain;flexibility decreased    Motor Skills/Interventions   Additional Documentation Balance Skills Training (Group)   Balance   Sitting Balance: Static normal balance   Sitting, Dynamic (Balance) good balance   Sit-to-Stand Balance good balance   Standing Balance: Static good balance   Standing Balance: Dynamic fair + balance   Post Treatment Status   Post Treatment Patient Status Patient sitting in bedside chair or w/c;Call light within reach   Support Present Post Treatment  Nurse present   Communication Post Treatement Nurse   Communication Post Treatment Comment  updates   Patient Effort good   Physical Therapy Clinical Impression   Assessment Pt presents with reduced activity tolerance but related to left hip pain and low back surgery about 1 month ago. Pt is able to amb with right SPC wtih some antalgic gait pattern but without LOB or increased back pain. Reviewed back/spinal precautions emphasis to no twisting. Continue using SPC, denies any new DME needs. Resume OPT once DCd.   Criteria for Skilled Therapeutic no problems identified which require skilled intervention   Impairments Found (describe specific impairments) gait, locomotion, and balance;joint integrity and mobility   Functional Limitations in Following  home management;community/leisure   Rehab Potential good   Predicted Duration of Therapy Intervention (days/wks) evaluation only   Anticipated Equipment Needs at Discharge (PT) none anticipated   Anticipated Discharge Disposition home with assist;home with outpatient services   Evaluation Complexity Justification   Patient History: Co-morbidity/factors that impact Plan of Care 3 or more that impact Plan of Care;Patient/family compliance concerns;Behavioral patterns;Coping style   Examination Components Balance;Transfers;Ambulation   Presentation Stable: Uncomplicated, straight-forward, problem focused   Clinical Decision Making Low complexity   Evaluation Complexity Low complexity   Planned Therapy Interventions, PT Eval    Planned Therapy Interventions (PT) patient/family education   Physical Therapy Time and Intention   Total PT Minutes: 20               INTERVENTION MINUTES: EVALUATION 20 minutes    EVALUATION COMPLEXITY : CLINICAL DECISION MAKING OF LOW COMPLEXITY AS INDICATED BY PMH, PHYSICAL THERAPY ASSESSMENT OF MUSCULOSKELETAL AND NEUROLOGICAL SYSTEMS AND ACTIVITY LIMITATIONS. CLINICAL PRESENTATION IS STABLE AND UNCOMPLICATED    Therapist:     Kacin Dancy, PT  10/06/2023, 17:37

## 2023-10-06 NOTE — Consults (Signed)
 ORTHOPAEDIC SURGERY CONSULT NOTE:    Patient Name: [name]   MRN: [id number]   Date of Birth: Jul 26, 1975  Age: 48 y.o.    Gender: male  Attending Physician: Kaur, Preetraj, MD    Date of Consultation: 10/06/2023    Reason for consult:  Avascular necrosis right and left hip     Subjective:    HPI:  [name] is a 48 y.o. male who presents to [facility] Emergency room yesterday via car secondary to abnormal labs.  He also has a history of chronic low back pain.  He had back surgery performed proximally 1 month ago by Dr. Arla Lab in [place].  He is currently taking physical therapy and rehab from low back surgery.  Patient was admitted to medical services.  CT scan showed avascular necrosis right and left hip.  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  Left hip pain is greater than right and sometimes walks with the aid of a cane.    REVIEW OF SYSTEMS  General:  fatigue, fevers, chills  Ear, nose and throat: No dysphagia or nosebleeds.   Eyes: No change in vision.   Cardiovascular: No chest pain, dyspnea on exertion. No abnormal heartbeat.    Lung: No cough or shortness of breath.   Skin: No rash.   Neurological: No seizures, tremors or headaches.   Psychiatric: No hallucinations or psychosis     PMH:  Past Medical History:   Diagnosis Date    Asthma     Bleeding hemorrhoid     Bleeding ulcer     Cervical herniated disc     COPD (chronic obstructive pulmonary disease)     GERD (gastroesophageal reflux disease)     Hx MRSA infection     Hyperlipidemia     Hypertension     Pre-diabetes     Rotator cuff arthropathy, right     Ulcerative colitis         PSH:  Past Surgical History:   Procedure Laterality Date    Hx back surgery      Hx cyst removal      Hx hernia repair      Hx tonsillectomy          ALLERGIES:  Allergies   Allergen Reactions    Noctec [Chloral Hydrate] Anaphylaxis    Penicillins Anaphylaxis    Theophylline Shortness of Breath    Flexeril  [Cyclobenzaprine] Mental Status Effect     Homicidal ideation    Prednisone  Myalgia    Steroids [Steroid] NO Steroids unless approved by Attending Physician        Social History:  Social History     Socioeconomic History    Marital status: Married   Tobacco Use    Smoking status: Every Day     Current packs/day: 1.00     Average packs/day: 1 pack/day for 30.0 years (30.0 ttl pk-yrs)     Types: Cigarettes     Passive exposure: Current    Smokeless tobacco: Never   Vaping Use    Vaping status: Never Used   Substance and Sexual Activity    Alcohol use: Yes     Alcohol/week: 35.0 standard drinks of alcohol     Types: 35 Cans of beer per week    Drug use: Never    Sexual activity: Not Currently     Social Determinants of Health     Financial Resource Strain: Low Risk  (  11/11/2021)    Financial Resource Strain     SDOH Financial: No   Transportation Needs: Low Risk  (11/11/2021)    Transportation Needs     SDOH Transportation: No   Social Connections: Low Risk  (10/06/2023)    Social Connections     SDOH Social Isolation: 5 or more times a week   Intimate Partner Violence: Low Risk  (11/11/2021)    Intimate Partner Violence     SDOH Domestic Violence: No   Housing Stability: Low Risk  (11/11/2021)    Housing Stability     SDOH Housing Situation: I have housing.     SDOH Housing Worry: No        Objective:    Last Filed Vitals:  Filed Vitals:    10/06/23 0228 10/06/23 0232 10/06/23 0617 10/06/23 0805   BP:    135/84   Pulse: (!) 101 (!) 101  89   Resp:    18   Temp:    36.7 C (98.1 F)   SpO2:   98% 96%        Constitutional: Appears comfortable, no acute distress.  Alert and oriented x 3 .  Cardiovascular: Regular rate, no peripheral edema.  Respiratory: Normal respiratory effort  Head: NCAT    Musculoskeletal Exam:     Patient seen evaluated lying supine position.  Right hip good internal and external rotation of the hip reproduces minimal pain.  Left hip little bit of restriction with internal and external rotation does  reproduce groin and some lateral hip pain.    Radiographs:   Results for orders placed or performed during the hospital encounter of 10/05/23 (from the past 72 hours)   CT ABDOMEN PELVIS W IV CONTRAST     Status: None    Narrative    SR. George ANDREW Lavalley SR.    RADIOLOGIST: Darris Emery, MD    CT ABDOMEN PELVIS W IV CONTRAST performed on 10/05/2023 7:37 PM    CLINICAL HISTORY: ruq pain  ruq pain, HERNIA REPAIR    TECHNIQUE:  Abdomen and pelvis CT with intravenous contrast.  IV CONTRAST: 75 ml of Omnipaque  350    COMPARISON:  February 2025    FINDINGS:  Lung bases: Unremarkable    Liver:   Moderately severe hepatomegaly with moderate heterogeneous decreased attenuation  No focal hepatic lesion  No intrahepatic or extrahepatic bile duct dilation      Gallbladder:   Unremarkable.    Spleen:   Unremarkable.    Pancreas:   Unremarkable.    Adrenals:   Unremarkable.    Kidneys:   Unremarkable.    Bladder:  Moderately distended  Prostate:  Unremarkable.    Bowel:   Unremarkable.    Appendix:  Normal.    Lymph nodes:  No suspicious lymph node enlargement.    Vasculature:   Mild atherosclerotic plaque aortoiliac and proximal femoral segments     Peritoneum / Retroperitoneum: No free fluid. No free air  Prior repair of periumbilical hernia    Bones:     AVN bilateral femoral heads worse on the right than the left with associated the fracture and mild collapse of the articular surface          Impression    HEPATOMEGALY INCREASED COMPARED TO FEBRUARY 2025 WITH ASSOCIATED HETEROGENEOUS DECREASED BACKGROUND ATTENUATION. 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        AVN BILATERAL FEMORAL HEADS      Radiologist location  ID: [id number]          Lab Data   Results from last 7 days   Lab Units 10/06/23  0135 10/05/23  1905 10/05/23  1807   CO2 mmol/L 23  --  26   BUN mg/dL 2*  --  2*   CREA mg/dL 0.34  --  7.42   GLUC mg/dL  --  595*  --    CA mg/dL 8.1*  --  9.7   ALKP U/L  --    --  180*   SGPT U/L  --   --  65*   SGOT U/L  --   --  105*     Lab Results   Component Value Date    WBC 12.3 (H) 10/06/2023    HGB 13.7 10/06/2023    HCT 41.0 10/06/2023     Lab Results   Component Value Date    APTT 30.9 10/05/2023    INR 1.06 10/05/2023     Lab Results   Component Value Date    TROPONINI 8 04/01/2023     No results found for: "HGBA1C"    Microbiology:      URINE CULTURE   Date Value Ref Range Status   11/11/2021 No Growth  Final     INFLUENZA VIRUS TYPE A   Date Value Ref Range Status   08/30/2021 Not Detected Not Detected Final     INFLUENZA VIRUS TYPE B   Date Value Ref Range Status   08/30/2021 Not Detected Not Detected Final           Impression and Plan:    Impression:   Avascular necrosis right and left hip     Plan:  We discussed avascular necrosis right and left hip pathology and treatment options.  Currently would recommend activity as pain allows.  Patient can follow up in office on an outpatient setting did discuss in result would be total hip arthroplasty in the future.  Patient currently has lot of medical issues and is recovering from low back surgery.      French Jester, PA-C  10/06/23 10:18  [facility]  Please call with any questions/concerns     This note has been created with voice recognition software.  Please excuse any errors in transcription.  Occasional wrong word or sound alike substitutions may have occurred due to the inherent limitations of voice recognition software.  Please read the chart carefully and recognize using context with the substitutions may have occurred.

## 2023-10-06 NOTE — Care Management Notes (Signed)
 [facility]  Care Management Initial Evaluation    Patient Name: [name]  Date of Birth: 1975-06-28  Sex: male  Date/Time of Admission: 10/05/2023  6:01 PM  Room/Bed: 205/A  Payor: [employer]tha [employer] [place] / Plan: [place] MEDICAID / Product Type: Medicaid [place] /   Primary Care Providers:  Nolan Battle, DO (General)    Pharmacy Info:   Preferred Pharmacy       [facility] - [place], [place] - [address]    [address] [place] [address]    Phone: [phone] Fax: [phone]    Hours: Not open 24 hours          Emergency Contact Info:   Extended Emergency Contact Information  Primary Emergency Contact: Kawan Valladolid  Mobile Phone: [phone]  Relation: Wife  Preferred language: English  Interpreter needed? No    History:   Correy Weidner is a 48 y.o., male, admitted to [facility] on 10/05/23, male, admitted to [facility] on 10/05/23    Height/Weight: 167.6 cm (5\' 6" ) / 86.2 kg (190 lb 0.9 oz)     LOS: 1 day   Admitting Diagnosis: Hypokalemia [E87.6]    Assessment:      10/06/23 1407   Assessment Details   Assessment Type Admission   Date of Care Management Update 10/06/23   Readmission   Is this a readmission? No   Insurance Information/Type   Insurance type Medicaid   Employment/Financial   Patient has Prescription Coverage?  Yes        Name of Insurance Coverage for Medications [employer]   Financial/Environmental Concerns none   Living Environment   Select an age group to open "lives with" row.  Adult   Lives With child(ren), dependent;spouse   Living Arrangements mobile home   Able to Return to Prior Arrangements yes   Home Safety   Home Assessment: No Problems Identified   Home Accessibility no concerns;stairs to enter home   Custody and Legal Status   Do you have a court appointed guardian/conservator? No   Are you an emancipated minor? No   Custody Issues? No   Paternity Affidavit Requested? No   Care Management Plan   Discharge Planning Status initial meeting   Discharge plan discussed with: Patient    CM will evaluate for rehabilitation potential yes   Patient choice offered to patient/family Yes   Discharge Needs Assessment   Equipment Currently Used at Home cane, straight;other (see comments)  (Patient said he "used to wear a Bipap at night because he has sleep apnea but quit weraring it and says he will not wear it again.")   Equipment Needed After Discharge none   Discharge Facility/Level of Care Needs Home (Patient/Family Member/other)(code 1)   Transportation Available family or friend will provide   Referral Information   Admission Type inpatient   Arrived From home or self-care         Discharge Plan:  Home (Patient/Family Member/other) (code 1)  CM met with patient for initial assessment of discharge needs. Patient lives with spouse and children in a mobile home with all necessary utilities and feels safe returning. Patient denies barriers in the home and drives self to medical appointments when necessary. Denies DME or HH prior to admission. Patient says he was getting PT through [facility] at [facility] on Mondays and Wednesdays prior to admission. CM discussed alcohol use and patient admits to drinking 4-5 10% alcohol beers daily for the pain in his back. CM discussed local  AA meetings and possible need for some substance use rehab but patient refused. Patient denies discharge needs and his goal is to return home upon discharge.     The patient will continue to be evaluated for developing discharge needs.     Case Manager: Rockney Cid, RN  Phone: [phone]

## 2023-10-06 NOTE — ED Nurses Note (Signed)
Report called to Scotty, nurse taking over care of pt.

## 2023-10-07 DIAGNOSIS — Z794 Long term (current) use of insulin: Secondary | ICD-10-CM

## 2023-10-07 LAB — CBC
HCT: 40.7 % (ref 36.7–47.1)
HGB: 13.6 g/dL (ref 12.5–16.3)
MCH: 31.7 pg (ref 23.8–33.4)
MCHC: 33.4 g/dL (ref 32.5–36.3)
MCV: 94.9 fL (ref 73.0–96.2)
MPV: 7.8 fL (ref 7.4–11.4)
PLATELETS: 285 10*3/uL (ref 140–440)
RBC: 4.29 10*6/uL (ref 4.06–5.63)
RDW: 19 % — ABNORMAL HIGH (ref 12.1–16.2)
WBC: 10.5 10*3/uL — ABNORMAL HIGH (ref 3.6–10.2)

## 2023-10-07 LAB — POC BLOOD GLUCOSE (RESULTS)
GLUCOSE, POC: 168 mg/dL — ABNORMAL HIGH (ref 70–100)
GLUCOSE, POC: 219 mg/dL — ABNORMAL HIGH (ref 70–100)
GLUCOSE, POC: 220 mg/dL — ABNORMAL HIGH (ref 70–100)
GLUCOSE, POC: 182 mg/dL — ABNORMAL HIGH (ref 70–100)

## 2023-10-07 LAB — COMPREHENSIVE METABOLIC PANEL, NON-FASTING
ALBUMIN/GLOBULIN RATIO: 1.1 (ref 0.8–1.4)
ALBUMIN: 3.4 g/dL — ABNORMAL LOW (ref 3.5–5.7)
ALKALINE PHOSPHATASE: 140 U/L — ABNORMAL HIGH (ref 34–104)
ALT (SGPT): 57 U/L — ABNORMAL HIGH (ref 7–52)
ANION GAP: 11 mmol/L (ref 4–13)
AST (SGOT): 95 U/L — ABNORMAL HIGH (ref 13–39)
BILIRUBIN TOTAL: 1 mg/dL (ref 0.3–1.0)
BUN/CREA RATIO: 5 — ABNORMAL LOW (ref 6–22)
BUN: 3 mg/dL — ABNORMAL LOW (ref 7–25)
CALCIUM, CORRECTED: 8.6 mg/dL — ABNORMAL LOW (ref 8.9–10.8)
CALCIUM: 8.1 mg/dL — ABNORMAL LOW (ref 8.6–10.3)
CHLORIDE: 101 mmol/L (ref 98–107)
CO2 TOTAL: 24 mmol/L (ref 21–31)
CREATININE: 0.61 mg/dL (ref 0.60–1.30)
ESTIMATED GFR: 121 mL/min/{1.73_m2} (ref >59)
GLOBULIN: 3 (ref 2.0–3.5)
GLUCOSE: 188 mg/dL — ABNORMAL HIGH (ref 74–109)
OSMOLALITY, CALCULATED: 274 mosm/kg (ref 270–290)
POTASSIUM: 3.2 mmol/L — ABNORMAL LOW (ref 3.5–5.1)
PROTEIN TOTAL: 6.4 g/dL (ref 6.4–8.9)
SODIUM: 136 mmol/L (ref 136–145)

## 2023-10-07 LAB — MAGNESIUM: MAGNESIUM: 1.8 mg/dL — ABNORMAL LOW (ref 1.9–2.7)

## 2023-10-07 LAB — HEPATITIS C ANTIBODY SCREEN WITH REFLEX TO HCV PCR: HCV ANTIBODY QUALITATIVE: NEGATIVE

## 2023-10-07 LAB — HEPATITIS B SURFACE ANTIGEN: HBV SURFACE ANTIGEN QUALITATIVE: NEGATIVE

## 2023-10-07 LAB — HEPATITIS A (HAV) IGM ANTIBODY: HAV IGM: NEGATIVE

## 2023-10-07 LAB — HEPATITIS B CORE IGM, AB: HBV CORE IGM ANTIBODY QUALITATIVE: NEGATIVE

## 2023-10-07 MED ORDER — ONDANSETRON HCL (PF) 4 MG/2 ML INJECTION SOLUTION
INTRAMUSCULAR | Status: AC
Start: 2023-10-07 — End: 2023-10-08
  Administered 2023-10-07: 4 mg via INTRAVENOUS
  Filled 2023-10-07: qty 2

## 2023-10-07 MED ORDER — MAGNESIUM SULFATE 1 GRAM/100 ML IN DEXTROSE 5 % INTRAVENOUS PIGGYBACK
1.0000 g | INJECTION | Freq: Once | INTRAVENOUS | Status: AC
Start: 2023-10-07 — End: 2023-10-07
  Administered 2023-10-07: 0 g via INTRAVENOUS
  Administered 2023-10-07: 1 g via INTRAVENOUS
  Filled 2023-10-07: qty 100

## 2023-10-07 MED ORDER — POTASSIUM CHLORIDE ER 20 MEQ TABLET,EXTENDED RELEASE(PART/CRYST)
40.0000 meq | ORAL_TABLET | Freq: Two times a day (BID) | ORAL | Status: DC
Start: 2023-10-07 — End: 2023-10-08
  Administered 2023-10-07 (×2): 40 meq via ORAL
  Filled 2023-10-07 (×2): qty 2

## 2023-10-07 MED ORDER — ONDANSETRON HCL (PF) 4 MG/2 ML INJECTION SOLUTION
4.0000 mg | Freq: Three times a day (TID) | INTRAMUSCULAR | Status: DC | PRN
Start: 2023-10-07 — End: 2023-10-10
  Administered 2023-10-07: 4 mg via INTRAVENOUS
  Filled 2023-10-07: qty 2

## 2023-10-07 NOTE — Progress Notes (Signed)
 [facility]  IP PROGRESS NOTE      [name], George Dejaynes  Date of Admission:  10/05/2023  Date of Birth:  December 12, 1975  Date of Service:  10/07/2023    Hospital Day:  LOS: 2 days     History of Present Illness  [name] is a 48 y.o. male seen and examined in follow up for lactic acidosis, new onset diabetes, alcohol abuse, cirrhosis, chronic obstructive pulmonary disease, hypertension, hypokalemia, hypomagnesemia, dehydration, avascular necrosis of the hips, ear pain.  Patient is sitting up on side of bed.  Denies any new current concerns.  Review of Systems  Other than ROS in the HPI, all other systems were negative    Vital Signs:  Temp (24hrs) Max:36.8 C (98.3 F)      Temperature: 36.5 C (97.7 F)  BP (Non-Invasive): 139/88  MAP (Non-Invasive): 100 mmHG  Heart Rate: 77  Respiratory Rate: 20  SpO2: 97 %    Current Medications:  aluminum -magnesium  hydroxide-simethicone  (MAG-AL PLUS) 200-200-20 mg per 5 mL oral liquid, 30 mL, Oral, Q4H PRN  budesonide  (ENTOCORT EC ) enteric coated capsule, 9 mg, Oral, QAM  budesonide -formoterol  (SYMBICORT ) 160 mcg-4.5 mcg per inhalation oral inhaler - "Respiratory to administer", 2 Puff, Inhalation, 2x/day  cefdinir  (OMNICEF ) capsule, 300 mg, Oral, Q12H  cholestyramine -sucrose (QUESTRAN ) 4 gram packet, 1 Packet, Oral, Daily  ciprofloxacin -dexAMETHasone  (CIPRODEX ) 0.3%-0.1% otic suspension, 4 Drop, Both Ears, 2x/day  Correction/SSIP insulin  lispro 100 units/mL injection, 1-5 Units, Subcutaneous, 4x/day AC  dextrose  (GLUTOSE) 40% oral gel, 15 g, Oral, Q15 Min PRN  dextrose  50% (0.5 g/mL) injection - syringe, 12.5 g, Intravenous, Q15 Min PRN  famotidine  (PEPCID ) tablet, 40 mg, Oral, Daily  folic acid  (FOLVITE ) tablet, 1 mg, Oral, Daily  glucagon  injection 1 mg, 1 mg, IntraMUSCULAR, Once PRN  LORazepam  (ATIVAN ) 2 mg/mL injection, 1 mg, Intravenous, Q2H PRN  magnesium  hydroxide (MILK OF MAGNESIA) 400mg  per 5mL oral liquid, 30 mL, Oral, HS PRN  mesalamine   (DELZICOL ) capsule with delayed release tablets inside, 1,200 mg, Oral, Daily  multivitamin (THERA) tablet, 1 Tablet, Oral, Daily  nicotine  (NICODERM CQ ) transdermal patch (mg/24 hr), 21 mg, Transdermal, Daily  ondansetron  (ZOFRAN ) 2 mg/mL injection, 4 mg, Intravenous, Q8H PRN  oxyCODONE  (ROXICODONE ) immediate release tablet, 5 mg, Oral, Q6H PRN  pantoprazole  (PROTONIX ) delayed release tablet, 40 mg, Oral, Daily  potassium chloride  (K-DUR) extended release tablet, 40 mEq, Oral, 2x/day-Food  pyridOXINE  Vitamin B6 tablet, 100 mg, Oral, Daily  spironolactone  (ALDACTONE ) tablet, 50 mg, Oral, Daily  thiamine -vitamin B1 tablet, 100 mg, Oral, Daily  traZODone  (DESYREL ) tablet, 50 mg, Oral, HS PRN - MR x 1        Current Orders:  Active Orders   Lab    CBC     Frequency: ONE TIME     Number of Occurrences: 1 Occurrences    CBC     Frequency: ONE TIME     Number of Occurrences: 1 Occurrences    COMPREHENSIVE METABOLIC PANEL, NON-FASTING     Frequency: ONE TIME     Number of Occurrences: 1 Occurrences    COMPREHENSIVE METABOLIC PANEL, NON-FASTING     Frequency: ONE TIME     Number of Occurrences: 1 Occurrences    MAGNESIUM      Frequency: ONE TIME     Number of Occurrences: 1 Occurrences    MAGNESIUM      Frequency: ONE TIME     Number of Occurrences: 1 Occurrences   Diet    DIET DIABETIC Calorie  amount: CC 2200; Do you want to initiate MNT Protocol? Yes     Frequency: All Meals     Number of Occurrences: 1 Occurrences   Nursing    APPLY SEQUENTIAL COMPRESSION DEVICE     Frequency: ONE TIME     Number of Occurrences: 1 Occurrences    HYPOGLYCEMIA MANAGEMENT - CONSCIOUS PATIENT W/DIET ORDER     Frequency: UNTIL DISCONTINUED     Number of Occurrences: Until Specified    HYPOGLYCEMIA MANAGEMENT - UNCONSCIOUS/ALTERED/NPO PATIENT     Frequency: UNTIL DISCONTINUED     Number of Occurrences: Until Specified    HYPOGLYCEMIA TREATMENT ALGORITHM     Frequency: UNTIL DISCONTINUED     Number of Occurrences: Until Specified    INTAKE AND  OUTPUT Q4H     Frequency: Q4H     Number of Occurrences: Until Specified    MAINTAIN SEQUENTIAL COMPRESSION DEVICE     Frequency: CONTINUOUS     Number of Occurrences: Until Specified    Notify MD Vital Signs     Frequency: PRN     Number of Occurrences: Until Specified    NURSE TO ENTER SECONDARY ORDER Other - (specify in comments) (CHEST PAIN AND/OR ARRYTHMIA)     Frequency: UNTIL DISCONTINUED     Number of Occurrences: Until Specified    PT IS INTERMEDIATE RISK FOR VENOUS THROMBOEMBOLISM     Frequency: CONTINUOUS     Number of Occurrences: Until Specified    PULSE OXIMETRY Q4H     Frequency: Q4H     Number of Occurrences: Until Specified    TELEMETRY MONITORING - Continuous     Frequency: CONTINUOUS     Number of Occurrences: Until Specified    VITAL SIGNS  Q4H     Frequency: Q4H     Number of Occurrences: 250 Occurrences   Consult    IP CONSULT TO CARE MANAGEMENT     Frequency: ONE TIME     Number of Occurrences: 1 Occurrences    IP CONSULT TO ENDOCRINE/METABOLIC - TELEMEDICINE     Frequency: ONE TIME     Number of Occurrences: 1 Occurrences    IP CONSULT TO GASTROENTEROLOGY Requested Provider; Wenceslao Haller     Frequency: ONE TIME     Number of Occurrences: 1 Occurrences    IP CONSULT TO ORTHOPEDICS On-Call Provider (nurse/clerk to determine)     Frequency: ONE TIME     Number of Occurrences: 1 Occurrences    IP CONSULT TO PATIENT EDUCATION     Frequency: ONE TIME     Number of Occurrences: 1 Occurrences   [facility]    CIWA-AR ASSESSMENT     Frequency: Q2H     Number of Occurrences: Until Specified    CIWA-AR ASSESSMENT     Frequency: Q4H     Number of Occurrences: Until Specified   Point of Care Testing    PERFORM POC WHOLE BLOOD GLUCOSE     Frequency: TID AC & HS     Number of Occurrences: Until Specified   Medications    aluminum -magnesium  hydroxide-simethicone  (MAG-AL PLUS) 200-200-20 mg per 5 mL oral liquid     Frequency: Q4H PRN     Dose: 30 mL     Route: Oral    budesonide  (ENTOCORT  EC) enteric coated capsule     Frequency: QAM     Dose: 9 mg     Route: Oral    budesonide -formoterol  (SYMBICORT ) 160 mcg-4.5 mcg per inhalation oral  inhaler - "Respiratory to administer"     Frequency: 2x/day     Dose: 2 Puff     Route: Inhalation    cefdinir  (OMNICEF ) capsule     Frequency: Q12H     Dose: 300 mg     Route: Oral    cholestyramine -sucrose (QUESTRAN ) 4 gram packet     Frequency: Daily     Dose: 1 Packet     Route: Oral    ciprofloxacin -dexAMETHasone  (CIPRODEX ) 0.3%-0.1% otic suspension     Frequency: 2x/day     Dose: 4 Drop     Route: Both Ears    Correction/SSIP insulin  lispro 100 units/mL injection     Frequency: 4x/day AC     Dose: 1-5 Units     Route: Subcutaneous    dextrose  (GLUTOSE) 40% oral gel     Frequency: Q15 Min PRN     Dose: 15 g     Route: Oral    dextrose  50% (0.5 g/mL) injection - syringe     Frequency: Q15 Min PRN     Dose: 12.5 g     Route: Intravenous    famotidine  (PEPCID ) tablet     Frequency: Daily     Dose: 40 mg     Route: Oral    folic acid  (FOLVITE ) tablet     Frequency: Daily     Dose: 1 mg     Route: Oral    glucagon  injection 1 mg     Frequency: Once PRN     Dose: 1 mg     Route: IntraMUSCULAR    LORazepam  (ATIVAN ) 2 mg/mL injection     Frequency: Q2H PRN     Dose: 1 mg     Route: Intravenous    magnesium  hydroxide (MILK OF MAGNESIA) 400mg  per 5mL oral liquid     Frequency: HS PRN     Dose: 30 mL     Route: Oral    mesalamine  (DELZICOL ) capsule with delayed release tablets inside     Frequency: Daily     Dose: 1,200 mg     Route: Oral    multivitamin (THERA) tablet     Frequency: Daily     Dose: 1 Tablet     Route: Oral    nicotine  (NICODERM CQ ) transdermal patch (mg/24 hr)     Frequency: Daily     Dose: 21 mg     Route: Transdermal    ondansetron  (ZOFRAN ) 2 mg/mL injection     Frequency: Q8H PRN     Dose: 4 mg     Route: Intravenous    oxyCODONE  (ROXICODONE ) immediate release tablet     Frequency: Q6H PRN     Dose: 5 mg     Route: Oral    pantoprazole  (PROTONIX ) delayed  release tablet     Frequency: Daily     Dose: 40 mg     Route: Oral    potassium chloride  (K-DUR) extended release tablet     Frequency: 2x/day-Food     Dose: 40 mEq     Route: Oral    pyridOXINE  Vitamin B6 tablet     Frequency: Daily     Dose: 100 mg     Route: Oral    spironolactone  (ALDACTONE ) tablet     Frequency: Daily     Dose: 50 mg     Route: Oral    thiamine -vitamin B1 tablet     Frequency: Daily     Dose: 100 mg  Route: Oral    traZODone  (DESYREL ) tablet     Frequency: HS PRN - MR x 1     Dose: 50 mg     Route: Oral        Today's Physical Exam:  Physical Exam  HENT:      Head: Normocephalic.      Ears:      Comments: Both external canals edematous and erythematous, tympanic membranes bilaterally mildly erythematous with poor light reflex mild bulging.       Mouth/Throat:      Mouth: Mucous membranes are moist.   Cardiovascular:      Rate and Rhythm: Normal rate and regular rhythm.      Pulses: Normal pulses.      Heart sounds: Normal heart sounds.   Pulmonary:      Effort: Pulmonary effort is normal.      Breath sounds: Normal breath sounds.   Abdominal:      General: Bowel sounds are normal.      Palpations: Abdomen is soft.   Musculoskeletal:         General: Normal range of motion.   Skin:     General: Skin is warm.      Capillary Refill: Capillary refill takes less than 2 seconds.   Neurological:      Mental Status: He is alert and oriented to person, place, and time.   Psychiatric:         Behavior: Behavior normal.          Consults:  Endocrinology, Orthopedics, patient education, case management    I/O:  I/O last 24 hours:    Intake/Output Summary (Last 24 hours) at 10/07/2023 1530  Last data filed at 10/07/2023 1022  Gross per 24 hour   Intake 540 ml   Output 351 ml   Net 189 ml     I/O current shift:  04/19 0700 - 04/19 1859  In: 320 [P.O.:220]  Out: -       Labs  Please indicate ordered or reviewed)  Reviewed: Lab Results Today:    Results for orders placed or performed during the hospital  encounter of 10/05/23 (from the past 24 hours)   POC BLOOD GLUCOSE (RESULTS)   Result Value Ref Range    GLUCOSE, POC 212 (H) 70 - 100 mg/dl   POC BLOOD GLUCOSE (RESULTS)   Result Value Ref Range    GLUCOSE, POC 244 (H) 70 - 100 mg/dl   CBC   Result Value Ref Range    WBC 10.5 (H) 3.6 - 10.2 x10^3/uL    RBC 4.29 4.06 - 5.63 x10^6/uL    HGB 13.6 12.5 - 16.3 g/dL    HCT 96.0 45.4 - 09.8 %    MCV 94.9 73.0 - 96.2 fL    MCH 31.7 23.8 - 33.4 pg    MCHC 33.4 32.5 - 36.3 g/dL    RDW 11.9 (H) 14.7 - 16.2 %    PLATELETS 285 140 - 440 x10^3/uL    MPV 7.8 7.4 - 11.4 fL   COMPREHENSIVE METABOLIC PANEL, NON-FASTING   Result Value Ref Range    SODIUM 136 136 - 145 mmol/L    POTASSIUM 3.2 (L) 3.5 - 5.1 mmol/L    CHLORIDE 101 98 - 107 mmol/L    CO2 TOTAL 24 21 - 31 mmol/L    ANION GAP 11 4 - 13 mmol/L    BUN 3 (L) 7 - 25 mg/dL    CREATININE 8.29 5.62 -  1.30 mg/dL    BUN/CREA RATIO 5 (L) 6 - 22    ESTIMATED GFR 121 >59 mL/min/1.50m^2    ALBUMIN 3.4 (L) 3.5 - 5.7 g/dL    CALCIUM 8.1 (L) 8.6 - 10.3 mg/dL    GLUCOSE 161 (H) 74 - 109 mg/dL    ALKALINE PHOSPHATASE 140 (H) 34 - 104 U/L    ALT (SGPT) 57 (H) 7 - 52 U/L    AST (SGOT) 95 (H) 13 - 39 U/L    BILIRUBIN TOTAL 1.0 0.3 - 1.0 mg/dL    PROTEIN TOTAL 6.4 6.4 - 8.9 g/dL    ALBUMIN/GLOBULIN RATIO 1.1 0.8 - 1.4    OSMOLALITY, CALCULATED 274 270 - 290 mOsm/kg    CALCIUM, CORRECTED 8.6 (L) 8.9 - 10.8 mg/dL    GLOBULIN 3.0 2.0 - 3.5   MAGNESIUM    Result Value Ref Range    MAGNESIUM  1.8 (L) 1.9 - 2.7 mg/dL   POC BLOOD GLUCOSE (RESULTS)   Result Value Ref Range    GLUCOSE, POC 168 (H) 70 - 100 mg/dl   POC BLOOD GLUCOSE (RESULTS)   Result Value Ref Range    GLUCOSE, POC 219 (H) 70 - 100 mg/dl       Diagnostic Tests (Please indicate ordered or reviewed)  Reviewed: I reviewed all new diagnostic tests.    Assessment/ Plan:   Active Hospital Problems   (*Primary Problem)    Diagnosis    *Hypokalemia    Diabetes mellitus, new onset (CMS HCC)    Cirrhosis    Alcohol abuse    Ulcerative colitis     Continuous tobacco abuse    COPD (chronic obstructive pulmonary disease)    HTN (hypertension)    Lactic acidosis     Otitis media/otitis externa  Cipro  ear drops bilaterally  Omnicef  continued    Lactic acidosis  Possibly secondary to dehydration alcohol abuse and new onset diabetes  Continue IV fluids and hydration    New onset diabetes  Hemoglobin A1c 7.3  Diabetic education requested  Tele endocrinology consulted  Very sensitive sliding scale    Alcohol abuse  CIWA protocol  Thiamine  folic acid   Educated patient needs cessation of alcohol abuse    Cirrhosis  We will need outpatient follow up with Dr. Alyssa Backbone  Liver ultrasound  IV hydration    Ulcerative colitis-chronic  Continue home medication    Tobacco abuse  Nicotine  patch if needed  Advised to stop smoking    Chronic obstructive pulmonary disease  Continue p.r.n. albuterol     Hypertension  Home medications continued  Monitor on telemetry    Hypokalemia/hypomagnesemia  Possibly secondary to alcohol abuse and poor nutrition  Replacement given  Repeat labs in a.m.    Chronic back pain  We will need outpatient pain specialist    Avascular necrosis of hips  Orthopedic specialist evaluated  We will need replacement and outpatient follow up with Orthopedics    Nutrition:    DIET DIABETIC Calorie amount: CC 2200; Do you want to initiate MNT Protocol? Yes    Additional clinical characteristics related to nutrition:    - monitor for weight changes   - monitor intake and output    - monitor bowel functions        Patient referral to pain specialist to her back pain.  The Hospitalist personally evaluated and examined the patient in conjunction with the MLP and agree with the assessments, treatment plan and disposition of the patient as recorded by the [facility].  Pennie Goins, FNP-BC      DVT/PE Prophylaxis: SCDs/ Venodynes/Impulse boots    Disposition Planning: Home discharge     Patient stated he had sore throat, and infection in the year for which he followed up with  PCP he also had lumps/lymph nodes under his chin which were inflamed, patient was prescribed cefdinir  and ciprofloxacin  drops for the ears with Decadron  drops.  Patient's left nodes had markedly improved.  Patient's sore throat is resolved.  Patient is still having some pain in the ears.  Patient's abdomen is distended, patient is aware about his enlarged liver.  Patient also complained of red spots which appeared on his arms bilaterally which seem consistent with spider angiomata.  Patient drinks alcohol 4-5 beers per day for the last 15 years.      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  Did not show liver cirrhosis.  Start normal saline at 75 cc an hour.  Check CRP.  Check hepatitis panel.  Start oxycodone  5 mg prn q.6 hours.  Potassium improved.  Continue cefdinir  for history of sore throat and ciprofloxacin  and Decadron  for otitis externa.  Continue thiamine  folic acid  and multivitamin.  Continue Pepcid .  Continue Aldactone  50 mg daily.  CIWA protocol in place.  Orthopedics consulted for AVN with fracture.  Endocrine on board for new onset diabetes mellitus.  Appreciate input.  PTOT/he has management.     04/19  Discussed with patient     Ultrasound findings.  GI was consulted consult.  Consult patient on alcohol cessation.  Patient needs referral for interventional pain.  Outpatient follow up with Orthopedics.  Patient had episode of nausea and vomiting in the morning.  Patient on enough to be discharged home electrolytes were replaced.  Patient will likely need to go home on metformin.  And close follow up with PCP for diabetes.  If patient feeling better patient  to be discharged tomorrow.     Samil Mecham, MD

## 2023-10-08 DIAGNOSIS — R197 Diarrhea, unspecified: Secondary | ICD-10-CM

## 2023-10-08 DIAGNOSIS — H6690 Otitis media, unspecified, unspecified ear: Secondary | ICD-10-CM

## 2023-10-08 DIAGNOSIS — R220 Localized swelling, mass and lump, head: Secondary | ICD-10-CM

## 2023-10-08 LAB — CBC
HCT: 41.9 % (ref 36.7–47.1)
HGB: 13.8 g/dL (ref 12.5–16.3)
MCH: 31.7 pg (ref 23.8–33.4)
MCHC: 33 g/dL (ref 32.5–36.3)
MCV: 95.9 fL (ref 73.0–96.2)
MPV: 7.5 fL (ref 7.4–11.4)
PLATELETS: 278 10*3/uL (ref 140–440)
RBC: 4.37 10*6/uL (ref 4.06–5.63)
RDW: 18.8 % — ABNORMAL HIGH (ref 12.1–16.2)
WBC: 11.6 10*3/uL — ABNORMAL HIGH (ref 3.6–10.2)

## 2023-10-08 LAB — COMPREHENSIVE METABOLIC PANEL, NON-FASTING
ALBUMIN/GLOBULIN RATIO: 1.2 (ref 0.8–1.4)
ALBUMIN: 3.6 g/dL (ref 3.5–5.7)
ALKALINE PHOSPHATASE: 137 U/L — ABNORMAL HIGH (ref 34–104)
ALT (SGPT): 52 U/L (ref 7–52)
ANION GAP: 8 mmol/L (ref 4–13)
AST (SGOT): 86 U/L — ABNORMAL HIGH (ref 13–39)
BILIRUBIN TOTAL: 0.7 mg/dL (ref 0.3–1.0)
BUN/CREA RATIO: 3 — ABNORMAL LOW (ref 6–22)
BUN: 2 mg/dL — ABNORMAL LOW (ref 7–25)
CALCIUM, CORRECTED: 9 mg/dL (ref 8.9–10.8)
CALCIUM: 8.7 mg/dL (ref 8.6–10.3)
CHLORIDE: 101 mmol/L (ref 98–107)
CO2 TOTAL: 26 mmol/L (ref 21–31)
CREATININE: 0.64 mg/dL (ref 0.60–1.30)
ESTIMATED GFR: 119 mL/min/{1.73_m2} (ref >59)
GLOBULIN: 3.1 (ref 2.0–3.5)
GLUCOSE: 150 mg/dL — ABNORMAL HIGH (ref 74–109)
OSMOLALITY, CALCULATED: 269 mosm/kg — ABNORMAL LOW (ref 270–290)
POTASSIUM: 4 mmol/L (ref 3.5–5.1)
PROTEIN TOTAL: 6.7 g/dL (ref 6.4–8.9)
SODIUM: 135 mmol/L — ABNORMAL LOW (ref 136–145)

## 2023-10-08 LAB — POC BLOOD GLUCOSE (RESULTS)
GLUCOSE, POC: 185 mg/dL — ABNORMAL HIGH (ref 70–100)
GLUCOSE, POC: 187 mg/dL — ABNORMAL HIGH (ref 70–100)
GLUCOSE, POC: 232 mg/dL — ABNORMAL HIGH (ref 70–100)
GLUCOSE, POC: 168 mg/dL — ABNORMAL HIGH (ref 70–100)
GLUCOSE, POC: 186 mg/dL — ABNORMAL HIGH (ref 70–100)

## 2023-10-08 LAB — MAGNESIUM: MAGNESIUM: 2 mg/dL (ref 1.9–2.7)

## 2023-10-08 MED ORDER — POTASSIUM CHLORIDE ER 20 MEQ TABLET,EXTENDED RELEASE(PART/CRYST)
20.0000 meq | ORAL_TABLET | Freq: Every morning | ORAL | Status: DC
Start: 2023-10-08 — End: 2023-10-10
  Administered 2023-10-08 – 2023-10-10 (×3): 20 meq via ORAL
  Filled 2023-10-08 (×2): qty 1

## 2023-10-08 MED ORDER — INSULIN GLARGINE 100 UNITS/ML SUBQ - CHARGE BY DOSE
10.0000 [IU] | Freq: Every day | SUBCUTANEOUS | Status: DC
Start: 2023-10-09 — End: 2023-10-10
  Administered 2023-10-09 – 2023-10-10 (×2): 10 [IU] via SUBCUTANEOUS
  Filled 2023-10-08 (×2): qty 10

## 2023-10-08 MED ORDER — CARVEDILOL 3.125 MG TABLET
3.1250 mg | ORAL_TABLET | Freq: Two times a day (BID) | ORAL | Status: DC
Start: 2023-10-08 — End: 2023-10-10
  Administered 2023-10-08 – 2023-10-10 (×5): 3.125 mg via ORAL
  Filled 2023-10-08 (×5): qty 1

## 2023-10-08 MED ORDER — LACTOBACILLUS RHAMNOSUS GG 10 BILLION CELL CAPSULE
1.0000 | ORAL_CAPSULE | Freq: Three times a day (TID) | ORAL | Status: DC
Start: 2023-10-08 — End: 2023-10-09
  Administered 2023-10-08 – 2023-10-09 (×4): 1 via ORAL
  Filled 2023-10-08 (×4): qty 1

## 2023-10-08 MED ORDER — INSULIN GLARGINE 100 UNITS/ML SUBQ - CHARGE BY DOSE
5.0000 [IU] | Freq: Every day | SUBCUTANEOUS | Status: DC
Start: 2023-10-08 — End: 2023-10-08
  Administered 2023-10-08: 5 [IU] via SUBCUTANEOUS
  Filled 2023-10-08: qty 5

## 2023-10-08 NOTE — Care Plan (Deleted)
 Patient stated he had sore throat, and infection in the year for which he followed up with PCP he also had lumps/lymph nodes under his chin which were inflamed, patient was prescribed cefdinir  and ciprofloxacin  drops for the ears with Decadron  drops.  Patient's left nodes had markedly improved.  Patient's sore throat is resolved.  Patient is still having some pain in the ears.  Patient's abdomen is distended, patient is aware about his enlarged liver.  Patient also complained of red spots which appeared on his arms bilaterally which seem consistent with spider angiomata.  Patient drinks alcohol 4-5 beers per day for the last 15 years.      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  Did not show liver cirrhosis.  Start normal saline at 75 cc an hour.  Check CRP.  Check hepatitis panel.  Start oxycodone  5 mg prn q.6 hours.  Potassium improved.  Continue cefdinir  for history of sore throat and ciprofloxacin  and Decadron  for otitis externa.  Continue thiamine  folic acid  and multivitamin.  Continue Pepcid .  Continue Aldactone  50 mg daily.  CIWA protocol in place.  Orthopedics consulted for AVN with fracture.  Endocrine on board for new onset diabetes mellitus.  Appreciate input.  PTOT/he has management.     04/19  Discussed with patient     Ultrasound findings.  GI was consulted consult.  Consult patient on alcohol cessation.  Patient needs referral for interventional pain.  Outpatient follow up with Orthopedics.  Patient had episode of nausea and vomiting in the morning.  Patient on enough to be discharged home electrolytes were replaced.  Patient will likely need to go home on  metformin.  And close follow up with PCP for diabetes.  If patient feeling better patient to be discharged tomorrow.    04/20  Patient doing well. Nausea improved. Diarrhea improved. Discharge tomorrow after final endocrinology recs. O/p f/u with orthopedics and interventional pain. Started coreg  for essential hypertension.      Shashank Kwasnik, MD

## 2023-10-08 NOTE — Respiratory Therapy (Signed)
 10/08/23 2146   Respiratory   Respiratory WDL WDL   Additional Documentation [facility]   Breath Sounds   L General Breath Sounds Diminished   R General Breath Sounds Diminished   Aerosol Therapy (SVN)   Start Time 2146   Treatment Status Given   Route (Aerosol Therapy) with spacer   $ Respiratory Treatment (Resp only) MDI (Sub)   Medications Symbicort    Signs of Intolerance (SVN) none   Respiratory Pre/Post-Treatment Assess   Pre-Treatment Heart Rate (beats/min) 88   Pre-Treatment Resp Rate (breaths/min) 20   Device (Oxygen Therapy) room air   SpO2 97 %     Tolerated tx well

## 2023-10-08 NOTE — Telemedicine Consult (Signed)
 Endocrinology Telemedicine Consult Follow-Up Note      Telemedicine Documentation:  Patient Location: [facility]  Patient/family aware of provider location: yes  Patient/family consent for telemedicine: yes  Examination observed and performed by: Sherolyn Dixon, MD  Provider's Physical Location: [facility]    Today's Date:       10/08/2023  Encounter Start Date:      10/05/2023  Inpatient Admission Date:  10/05/2023  Name:             [name]  Age:             48 y.o.  Attending:             Kaur, Preetraj, MD  PCP             Quin Brush, DO      Reason for Consult:   New diagnosis of DM   Chief Complaint:    Hypokalemia     Subjective:   Patient reports feeling well  Has been eating all his meals   Will be d/ced tomorrow per team   Current Facility-Administered Medications   Medication Dose Route Frequency Provider Last Rate Last Admin    aluminum -magnesium  hydroxide-simethicone  (MAG-AL PLUS) 200-200-20 mg per 5 mL oral liquid  30 mL Oral Q4H PRN Bowman, Kristin D, DO        budesonide  (ENTOCORT EC ) enteric coated capsule  9 mg Oral QAM Bowman, Kristin D, DO        budesonide -formoterol  (SYMBICORT ) 160 mcg-4.5 mcg per inhalation oral inhaler - "Respiratory to administer"  2 Puff Inhalation 2x/day Bowman, Kristin D, DO   2 Puff at 10/07/23 2003    carvedilol  (COREG ) tablet  3.125 mg Oral 2x/day-Food Kaur, Preetraj, MD   3.125 mg at 10/08/23 5409    cefdinir  (OMNICEF ) capsule  300 mg Oral Q12H Niki Barter, PA-C   300 mg at 10/08/23 8119    cholestyramine -sucrose (QUESTRAN ) 4 gram packet  1 Packet Oral Daily Bowman, Kristin D, DO   1 Packet at 10/08/23 1478    ciprofloxacin -dexAMETHasone  (CIPRODEX ) 0.3%-0.1% otic suspension  4 Drop Both Ears 2x/day Goins, Pennie, FNP-BC   4 Drop at 10/08/23 2956    Correction/SSIP insulin  lispro 100 units/mL injection  1-5 Units Subcutaneous 4x/day AC Bowman, Kristin D, DO   1 Units at 10/08/23 2130    dextrose  (GLUTOSE) 40% oral  gel  15 g Oral Q15 Min PRN Bowman, Kristin D, DO        dextrose  50% (0.5 g/mL) injection - syringe  12.5 g Intravenous Q15 Min PRN Duwaine Gins, Kristin D, DO        famotidine  (PEPCID ) tablet  40 mg Oral Daily Bowman, Kristin D, DO   40 mg at 10/08/23 8657    folic acid  (FOLVITE ) tablet  1 mg Oral Daily Bowman, Kristin D, DO   1 mg at 10/08/23 8469    glucagon  injection 1 mg  1 mg IntraMUSCULAR Once PRN Bowman, Kristin D, DO        insulin  glargine 100 units/mL injection  5 Units Subcutaneous Daily Legend Tumminello, MD        lactobacillus rhamnosus (CULTURELLE) active cultures capsule  1 Capsule Oral 3x/day Kaur, Preetraj, MD   1 Capsule at 10/08/23 6295    LORazepam  (ATIVAN ) 2 mg/mL injection  1 mg Intravenous Q2H PRN Bowman, Kristin D, DO        magnesium  hydroxide (MILK OF MAGNESIA) 400mg  per 5mL oral liquid  30  mL Oral HS PRN Bowman, Kristin D, DO        mesalamine  (DELZICOL ) capsule with delayed release tablets inside  1,200 mg Oral Daily Bowman, Kristin D, DO   1,200 mg at 10/08/23 1610    multivitamin (THERA) tablet  1 Tablet Oral Daily Kaur, Preetraj, MD   1 Tablet at 10/08/23 [phone]    nicotine  (NICODERM CQ ) transdermal patch (mg/24 hr)  21 mg Transdermal Daily Niki Barter, PA-C   21 mg at 10/08/23 5409    ondansetron  (ZOFRAN ) 2 mg/mL injection  4 mg Intravenous Q8H PRN Niki Barter, PA-C   4 mg at 10/07/23 8119    oxyCODONE  (ROXICODONE ) immediate release tablet  5 mg Oral Q6H PRN Kaur, Preetraj, MD   5 mg at 10/08/23 0246    pantoprazole  (PROTONIX ) delayed release tablet  40 mg Oral Daily Bowman, Kristin D, DO   40 mg at 10/08/23 1478    potassium chloride  (K-DUR) extended release tablet  20 mEq Oral Daily with Breakfast Kaur, Preetraj, MD   20 mEq at 10/08/23 2956    pyridOXINE  Vitamin B6 tablet  100 mg Oral Daily Bowman, Kristin D, DO   100 mg at 10/08/23 2130    spironolactone  (ALDACTONE ) tablet  50 mg Oral Daily Bowman, Kristin D, DO   50 mg at 10/08/23 8657    thiamine -vitamin B1 tablet  100 mg  Oral Daily Bowman, Kristin D, DO   100 mg at 10/08/23 8469    traZODone  (DESYREL ) tablet  50 mg Oral HS PRN - MR x 1 Bowman, Kristin D, DO   50 mg at 10/08/23 0002     Objective:  Temperature: 36.7 C (98.1 F)  Heart Rate: 93  BP (Non-Invasive): (!) 148/88  Respiratory Rate: 19  SpO2: 96 %  Full Exam Limited as Encounter is Virtual  BP 138/82   Pulse 79   Temp 36.5 C (97.7 F)   Resp 20   Ht 1.676 m (5\' 6" )   Wt 86.2 kg (190 lb 0.9 oz)   SpO2 98%   BMI 30.68 kg/m       General: pleasant, no acute distress, conversant   Psych: appropriate affect, AOx3, normal speech pattern  Skin: warm, no Pallor   Eyes: non-injected conjunctiva   HENT: atraumatic, oral mucosa moist and pink,   Neck: trachea midline, neck supple   Thyroid : no visible goiter  Lungs: normal respiratory effort   Musculoskeletal: normal looking joints, moves all extremities   Neurological: no tremor, speech fluent          Labs:  Reviewed  Lab Results   Component Value Date    HA1C 7.3 (H) 10/05/2023        BMP (Last 24 Hours):    Recent Results last 24 hours     10/08/23  0243   SODIUM 135*   POTASSIUM 4.0   CHLORIDE 101   CO2 26   BUN 2*   CREATININE 0.64   CALCIUM 8.7   GLUCOSENF 150*         CBC with Diff (Last 24 Hours):    Recent Results last 24 hours     10/08/23  0243   WBC 11.6*   HGB 13.8   HCT 41.9   MCV 95.9   PLTCNT 278         Recent Labs     10/06/23  1134 10/06/23  1615 10/06/23  2035 10/07/23  0726 10/07/23  1156 10/07/23  1622 10/07/23  2004  10/08/23  0758   GLUIP 181* 212* 244* 168* 219* 220* 182* 185*       Lab Results   Component Value Date    TSH 2.405 11/25/2022            Assessment/Recommendations:  [name] is a 48 y.o.  with PMH of pre-diabetes, asthma, HTN, HLD, COPD, GERD, ulcerative colitis/Crohn's, Barrett's esophagus, liver cirrhosis, and alcohol use admitted for lactic acidosis secondary to dehydration, alcohol use, and new-onset diabetes. Endocrinology is consulted for new-onset diabetes mellitus.      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 Yes     Recommend ;  Add on low dose Lantus  5 units  Follow pending labs     On d/c:  Since pending work up for T1 DM though very less likely will recommend to DC on insulin  can probably do Lantus  only as 10 units  Will need pen , pen needles and education as how to inject   Not to continue metformin since has lactic acidosis and may need to wait a bit before re challenged   Will need to follow up outpatient to adjust dose patient to follow with his PCP   Will follow pending labs and if abnormal will let him know     Recommendations discussed with primary team, PRN HOSPITALIST 3.     Thank you for this consult. Will sign off  Please page with questions.    On the day of the encounter, a total of  25  minutes were spent on this patient encounter including review of historical information, examination, and documentation.    Sherolyn Dixon , MD  Assistant Professor  Endocrinology & Metabolism  [facility]    10/08/2023 09:04

## 2023-10-08 NOTE — ED Notes (Signed)
 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  He declined treatment options and/or AA meetings.    Prior treatment history?: No    Currently enrolled in substance use program?: No    Within the last 30 days, what substances has the patient used?: Alcohol                   Family, Social, Home & Safety History  Marital Status: Married            Need to improve relationships with family?: No    Social network: Immediate family, Substance using peers, Non-substance using peers/friends/other    Current living situation: Independent  Any help needed with the following?: None  Contact phone number for the patient: [phone]  Emergency contact name and phone number: [name] (Wife)  [phone]    Has the patient had any legal issues within the past 30 days?: None         Employment            Engagement  Readiness ruler: 1    Brief Intervention  Discussed plan to reduce/quit substance use?: Yes  Discussed willingness to enter treatment?: Yes  Indicated patient's stage of change:: 1 - Precontemplation    Patient seen by Peer Recovery Coach and is a candidate for buprenorphine administration in the ED. Patient needs assessment for bup treatment.: No    Plan  Was the patient referred to treatment?: No    Was patient referred to physician for Buprenorphine Assessment in the ED?: No    Did patient receive Narcan  in the ED?: No          Follow-up           Need for additional follow-up?: No       [name], Peer Recovery Coach 10/08/2023 13:01

## 2023-10-08 NOTE — Progress Notes (Signed)
 [facility]  IP PROGRESS NOTE      [name]  Date of Admission:  10/05/2023  Date of Birth:  May 28, 1976  Date of Service:  10/08/2023    Hospital Day:  LOS: 3 days     History of Present Illness  [name] is a 48 y.o. male seen and examined in follow up for lactic acidosis, new onset diabetes, alcohol abuse, cirrhosis, chronic obstructive pulmonary disease, hypertension, hypokalemia, hypomagnesemia, dehydration, avascular necrosis of the hips, ear pain.  Patient is sitting up on side of bed.  Denies any new current concerns.  10/08/2023 Patient seen and examined at bedside. Patient requesting glucometer when he is discharged and to be referred to pain clinic. States he only drinks to ease the pain. He has not thought about alcohol the whole time he has been here because we are giving him pain medication and his pain is controlled. Endocrinology would like to keep patient another night to monitor accu checks.   Review of Systems  Other than ROS in the HPI, all other systems were negative    Vital Signs:  Temp (24hrs) Max:37.1 C (98.7 F)      Temperature: 36.7 C (98.1 F)  BP (Non-Invasive): (!) 148/88  MAP (Non-Invasive): 101 mmHG  Heart Rate: 83  Respiratory Rate: 19  SpO2: 96 %    Current Medications:  aluminum -magnesium  hydroxide-simethicone  (MAG-AL PLUS) 200-200-20 mg per 5 mL oral liquid, 30 mL, Oral, Q4H PRN  budesonide  (ENTOCORT EC ) enteric coated capsule, 9 mg, Oral, QAM  budesonide -formoterol  (SYMBICORT ) 160 mcg-4.5 mcg per inhalation oral inhaler - "Respiratory to administer", 2 Puff, Inhalation, 2x/day  carvedilol  (COREG ) tablet, 3.125 mg, Oral, 2x/day-Food  cefdinir  (OMNICEF ) capsule, 300 mg, Oral, Q12H  cholestyramine -sucrose (QUESTRAN ) 4 gram packet, 1 Packet, Oral, Daily  ciprofloxacin -dexAMETHasone  (CIPRODEX ) 0.3%-0.1% otic suspension, 4 Drop, Both Ears, 2x/day  Correction/SSIP insulin  lispro 100 units/mL injection, 1-5 Units, Subcutaneous, 4x/day  AC  dextrose  (GLUTOSE) 40% oral gel, 15 g, Oral, Q15 Min PRN  dextrose  50% (0.5 g/mL) injection - syringe, 12.5 g, Intravenous, Q15 Min PRN  famotidine  (PEPCID ) tablet, 40 mg, Oral, Daily  folic acid  (FOLVITE ) tablet, 1 mg, Oral, Daily  glucagon  injection 1 mg, 1 mg, IntraMUSCULAR, Once PRN  insulin  glargine 100 units/mL injection, 5 Units, Subcutaneous, Daily  lactobacillus rhamnosus (CULTURELLE) active cultures capsule, 1 Capsule, Oral, 3x/day  LORazepam  (ATIVAN ) 2 mg/mL injection, 1 mg, Intravenous, Q2H PRN  magnesium  hydroxide (MILK OF MAGNESIA) 400mg  per 5mL oral liquid, 30 mL, Oral, HS PRN  mesalamine  (DELZICOL ) capsule with delayed release tablets inside, 1,200 mg, Oral, Daily  multivitamin (THERA) tablet, 1 Tablet, Oral, Daily  nicotine  (NICODERM CQ ) transdermal patch (mg/24 hr), 21 mg, Transdermal, Daily  ondansetron  (ZOFRAN ) 2 mg/mL injection, 4 mg, Intravenous, Q8H PRN  oxyCODONE  (ROXICODONE ) immediate release tablet, 5 mg, Oral, Q6H PRN  pantoprazole  (PROTONIX ) delayed release tablet, 40 mg, Oral, Daily  potassium chloride  (K-DUR) extended release tablet, 20 mEq, Oral, Daily with Breakfast  pyridOXINE  Vitamin B6 tablet, 100 mg, Oral, Daily  spironolactone  (ALDACTONE ) tablet, 50 mg, Oral, Daily  thiamine -vitamin B1 tablet, 100 mg, Oral, Daily  traZODone  (DESYREL ) tablet, 50 mg, Oral, HS PRN - MR x 1        Current Orders:  Active Orders   Lab    CBC     Frequency: ONE TIME     Number of Occurrences: 1 Occurrences    COMPREHENSIVE METABOLIC PANEL, NON-FASTING     Frequency: ONE TIME  Number of Occurrences: 1 Occurrences    MAGNESIUM      Frequency: ONE TIME     Number of Occurrences: 1 Occurrences   Diet    DIET DIABETIC Calorie amount: CC 2200; Do you want to initiate MNT Protocol? Yes     Frequency: All Meals     Number of Occurrences: 1 Occurrences   Nursing    APPLY SEQUENTIAL COMPRESSION DEVICE     Frequency: ONE TIME     Number of Occurrences: 1 Occurrences    HYPOGLYCEMIA MANAGEMENT - CONSCIOUS  PATIENT W/DIET ORDER     Frequency: UNTIL DISCONTINUED     Number of Occurrences: Until Specified    HYPOGLYCEMIA MANAGEMENT - UNCONSCIOUS/ALTERED/NPO PATIENT     Frequency: UNTIL DISCONTINUED     Number of Occurrences: Until Specified    HYPOGLYCEMIA TREATMENT ALGORITHM     Frequency: UNTIL DISCONTINUED     Number of Occurrences: Until Specified    INTAKE AND OUTPUT Q4H     Frequency: Q4H     Number of Occurrences: Until Specified    MAINTAIN SEQUENTIAL COMPRESSION DEVICE     Frequency: CONTINUOUS     Number of Occurrences: Until Specified    Notify MD Vital Signs     Frequency: PRN     Number of Occurrences: Until Specified    NURSE TO ENTER SECONDARY ORDER Other - (specify in comments) (CHEST PAIN AND/OR ARRYTHMIA)     Frequency: UNTIL DISCONTINUED     Number of Occurrences: Until Specified    PT IS INTERMEDIATE RISK FOR VENOUS THROMBOEMBOLISM     Frequency: CONTINUOUS     Number of Occurrences: Until Specified    PULSE OXIMETRY Q4H     Frequency: Q4H     Number of Occurrences: Until Specified    REFERRAL TO [email or web address] - FOR ENROLLMENT IN FREE, ONLINE GROUP CLASSES FOR TOBACCO CESSATION     Frequency: ONE TIME     Number of Occurrences: 1 Occurrences     Order Comments: Visit [email or web address] to access FREE online modules to take at your own pace or group classes to help you quit tobacco.       Scheduling Instructions:      Visit [email or web address] to access FREE online modules to take at your own pace or group classes to help you quit tobacco.    TELEMETRY MONITORING - Continuous     Frequency: CONTINUOUS     Number of Occurrences: Until Specified    TOBACCO CESSATION: I HAVE DISCUSSED REFERRAL FOR TOBACCO CESSATION COUNSELING AND INITIATION OF NICOTINE  REPLACMENT THERAPY WITH THIS PATIENT.     Frequency: ONE TIME     Number of Occurrences: 1 Occurrences    VITAL SIGNS  Q4H     Frequency: Q4H     Number of Occurrences: 250 Occurrences   Consult    IP CONSULT TO CARE MANAGEMENT     Frequency: ONE TIME      Number of Occurrences: 1 Occurrences    IP CONSULT TO ENDOCRINE/METABOLIC - TELEMEDICINE     Frequency: ONE TIME     Number of Occurrences: 1 Occurrences    IP CONSULT TO GASTROENTEROLOGY Requested Provider; Wenceslao Haller     Frequency: ONE TIME     Number of Occurrences: 1 Occurrences    IP CONSULT TO ORTHOPEDICS On-Call Provider (nurse/clerk to determine)     Frequency: ONE TIME     Number of Occurrences: 1 Occurrences    IP CONSULT TO PATIENT EDUCATION  Frequency: ONE TIME     Number of Occurrences: 1 Occurrences   [facility]    CIWA-AR ASSESSMENT     Frequency: Q2H     Number of Occurrences: Until Specified    CIWA-AR ASSESSMENT     Frequency: Q4H     Number of Occurrences: Until Specified   Point of Care Testing    PERFORM POC WHOLE BLOOD GLUCOSE     Frequency: TID AC & HS     Number of Occurrences: Until Specified   Schedule Follow Up Orders    REFERRAL TO [place] QUITLINE - TOBACCO CESSATION     Frequency: ONE TIME     Number of Occurrences: 1 Occurrences   Medications    aluminum -magnesium  hydroxide-simethicone  (MAG-AL PLUS) 200-200-20 mg per 5 mL oral liquid     Frequency: Q4H PRN     Dose: 30 mL     Route: Oral    budesonide  (ENTOCORT EC ) enteric coated capsule     Frequency: QAM     Dose: 9 mg     Route: Oral    budesonide -formoterol  (SYMBICORT ) 160 mcg-4.5 mcg per inhalation oral inhaler - "Respiratory to administer"     Frequency: 2x/day     Dose: 2 Puff     Route: Inhalation    carvedilol  (COREG ) tablet     Frequency: 2x/day-Food     Dose: 3.125 mg     Route: Oral    cefdinir  (OMNICEF ) capsule     Frequency: Q12H     Dose: 300 mg     Route: Oral    cholestyramine -sucrose (QUESTRAN ) 4 gram packet     Frequency: Daily     Dose: 1 Packet     Route: Oral    ciprofloxacin -dexAMETHasone  (CIPRODEX ) 0.3%-0.1% otic suspension     Frequency: 2x/day     Dose: 4 Drop     Route: Both Ears    Correction/SSIP insulin  lispro 100 units/mL injection     Frequency: 4x/day AC     Dose: 1-5 Units     Route:  Subcutaneous    dextrose  (GLUTOSE) 40% oral gel     Frequency: Q15 Min PRN     Dose: 15 g     Route: Oral    dextrose  50% (0.5 g/mL) injection - syringe     Frequency: Q15 Min PRN     Dose: 12.5 g     Route: Intravenous    famotidine  (PEPCID ) tablet     Frequency: Daily     Dose: 40 mg     Route: Oral    folic acid  (FOLVITE ) tablet     Frequency: Daily     Dose: 1 mg     Route: Oral    glucagon  injection 1 mg     Frequency: Once PRN     Dose: 1 mg     Route: IntraMUSCULAR    insulin  glargine 100 units/mL injection     Frequency: Daily     Dose: 5 Units     Route: Subcutaneous    lactobacillus rhamnosus (CULTURELLE) active cultures capsule     Frequency: 3x/day     Dose: 1 Capsule     Route: Oral    LORazepam  (ATIVAN ) 2 mg/mL injection     Frequency: Q2H PRN     Dose: 1 mg     Route: Intravenous    magnesium  hydroxide (MILK OF MAGNESIA) 400mg  per 5mL oral liquid     Frequency: HS PRN  Dose: 30 mL     Route: Oral    mesalamine  (DELZICOL ) capsule with delayed release tablets inside     Frequency: Daily     Dose: 1,200 mg     Route: Oral    multivitamin (THERA) tablet     Frequency: Daily     Dose: 1 Tablet     Route: Oral    nicotine  (NICODERM CQ ) transdermal patch (mg/24 hr)     Frequency: Daily     Dose: 21 mg     Route: Transdermal    ondansetron  (ZOFRAN ) 2 mg/mL injection     Frequency: Q8H PRN     Dose: 4 mg     Route: Intravenous    oxyCODONE  (ROXICODONE ) immediate release tablet     Frequency: Q6H PRN     Dose: 5 mg     Route: Oral    pantoprazole  (PROTONIX ) delayed release tablet     Frequency: Daily     Dose: 40 mg     Route: Oral    potassium chloride  (K-DUR) extended release tablet     Frequency: Daily with Breakfast     Dose: 20 mEq     Route: Oral    pyridOXINE  Vitamin B6 tablet     Frequency: Daily     Dose: 100 mg     Route: Oral    spironolactone  (ALDACTONE ) tablet     Frequency: Daily     Dose: 50 mg     Route: Oral    thiamine -vitamin B1 tablet     Frequency: Daily     Dose: 100 mg     Route: Oral     traZODone  (DESYREL ) tablet     Frequency: HS PRN - MR x 1     Dose: 50 mg     Route: Oral        Today's Physical Exam:  Physical Exam  HENT:      Head: Normocephalic.      Ears:      Comments: Both external canals edematous and erythematous, tympanic membranes bilaterally mildly erythematous with poor light reflex mild bulging.       Mouth/Throat:      Mouth: Mucous membranes are moist.   Cardiovascular:      Rate and Rhythm: Normal rate and regular rhythm.      Pulses: Normal pulses.      Heart sounds: Normal heart sounds.   Pulmonary:      Effort: Pulmonary effort is normal.      Breath sounds: Normal breath sounds.   Abdominal:      General: Bowel sounds are normal.      Palpations: Abdomen is soft.   Musculoskeletal:         General: Normal range of motion.   Skin:     General: Skin is warm.      Capillary Refill: Capillary refill takes less than 2 seconds.   Neurological:      Mental Status: He is alert and oriented to person, place, and time.   Psychiatric:         Behavior: Behavior normal.          Consults:  Endocrinology, Orthopedics, patient education, case management    I/O:  I/O last 24 hours:    Intake/Output Summary (Last 24 hours) at 10/08/2023 1128  Last data filed at 10/08/2023 0630  Gross per 24 hour   Intake 440 ml   Output --   Net 440 ml  I/O current shift:  No intake/output data recorded.      Labs  Please indicate ordered or reviewed)  Reviewed: Lab Results Today:    Results for orders placed or performed during the hospital encounter of 10/05/23 (from the past 24 hours)   POC BLOOD GLUCOSE (RESULTS)   Result Value Ref Range    GLUCOSE, POC 219 (H) 70 - 100 mg/dl   POC BLOOD GLUCOSE (RESULTS)   Result Value Ref Range    GLUCOSE, POC 220 (H) 70 - 100 mg/dl   POC BLOOD GLUCOSE (RESULTS)   Result Value Ref Range    GLUCOSE, POC 182 (H) 70 - 100 mg/dl   CBC   Result Value Ref Range    WBC 11.6 (H) 3.6 - 10.2 x10^3/uL    RBC 4.37 4.06 - 5.63 x10^6/uL    HGB 13.8 12.5 - 16.3 g/dL    HCT 16.1  09.6 - 04.5 %    MCV 95.9 73.0 - 96.2 fL    MCH 31.7 23.8 - 33.4 pg    MCHC 33.0 32.5 - 36.3 g/dL    RDW 40.9 (H) 81.1 - 16.2 %    PLATELETS 278 140 - 440 x10^3/uL    MPV 7.5 7.4 - 11.4 fL   COMPREHENSIVE METABOLIC PANEL, NON-FASTING   Result Value Ref Range    SODIUM 135 (L) 136 - 145 mmol/L    POTASSIUM 4.0 3.5 - 5.1 mmol/L    CHLORIDE 101 98 - 107 mmol/L    CO2 TOTAL 26 21 - 31 mmol/L    ANION GAP 8 4 - 13 mmol/L    BUN 2 (L) 7 - 25 mg/dL    CREATININE 9.14 7.82 - 1.30 mg/dL    BUN/CREA RATIO 3 (L) 6 - 22    ESTIMATED GFR 119 >59 mL/min/1.19m^2    ALBUMIN 3.6 3.5 - 5.7 g/dL    CALCIUM 8.7 8.6 - 95.6 mg/dL    GLUCOSE 213 (H) 74 - 109 mg/dL    ALKALINE PHOSPHATASE 137 (H) 34 - 104 U/L    ALT (SGPT) 52 7 - 52 U/L    AST (SGOT) 86 (H) 13 - 39 U/L    BILIRUBIN TOTAL 0.7 0.3 - 1.0 mg/dL    PROTEIN TOTAL 6.7 6.4 - 8.9 g/dL    ALBUMIN/GLOBULIN RATIO 1.2 0.8 - 1.4    OSMOLALITY, CALCULATED 269 (L) 270 - 290 mOsm/kg    CALCIUM, CORRECTED 9.0 8.9 - 10.8 mg/dL    GLOBULIN 3.1 2.0 - 3.5   MAGNESIUM    Result Value Ref Range    MAGNESIUM  2.0 1.9 - 2.7 mg/dL   POC BLOOD GLUCOSE (RESULTS)   Result Value Ref Range    GLUCOSE, POC 185 (H) 70 - 100 mg/dl       Diagnostic Tests (Please indicate ordered or reviewed)  Reviewed: I reviewed all new diagnostic tests.    Assessment/ Plan:   Active Hospital Problems   (*Primary Problem)    Diagnosis    *Hypokalemia    Diabetes mellitus, new onset (CMS HCC)    Cirrhosis    Alcohol abuse    Ulcerative colitis    Continuous tobacco abuse    COPD (chronic obstructive pulmonary disease)    HTN (hypertension)    Lactic acidosis     Otitis media/otitis externa  Cipro  ear drops bilaterally  Omnicef  continued    Lactic acidosis  Possibly secondary to dehydration alcohol abuse and new onset diabetes  Continue IV fluids and hydration  New onset diabetes  Hemoglobin A1c 7.3  Diabetic education requested  Tele endocrinology consulted  Very sensitive sliding scale  Will need glucometer on d/c      Alcohol abuse  CIWA protocol  Thiamine  folic acid   Educated patient needs cessation of alcohol abuse    Hepatomegaly/hepatic steatosis   We will need outpatient follow up with Dr. Alyssa Backbone  Liver ultrasound: hepatic steatosis       Ulcerative colitis-chronic  Continue home medication    Tobacco abuse  Nicotine  patch if needed  Advised to stop smoking    Chronic obstructive pulmonary disease  Continue p.r.n. albuterol     Hypertension  Home medications continued  Monitor on telemetry    Hypokalemia/hypomagnesemia  Possibly secondary to alcohol abuse and poor nutrition  Replacement given  Repeat labs in a.m.    Chronic back pain  We will need outpatient pain specialist    Avascular necrosis of hips  Orthopedic specialist evaluated  We will need replacement and outpatient follow up with Orthopedics  Patient does not want to see orthopedics here , he would like to go elsewhere    Nutrition:    DIET DIABETIC Calorie amount: CC 2200; Do you want to initiate MNT Protocol? Yes    Additional clinical characteristics related to nutrition:    - monitor for weight changes   - monitor intake and output    - monitor bowel functions        Patient referral to pain specialist due to chronic back pain.  The Hospitalist personally evaluated and examined the patient in conjunction with the MLP and agree with the assessments, treatment plan and disposition of the patient as recorded by the [facility].     Melissa Lilly, PA-C       DVT/PE Prophylaxis: SCDs/ Venodynes/Impulse boots    Disposition Planning: Home discharge     Patient stated he had sore throat, and infection in the year for which he followed up with PCP he also had lumps/lymph nodes under his chin which were inflamed, patient was prescribed cefdinir  and ciprofloxacin  drops for the ears with Decadron  drops.  Patient's left nodes had markedly improved.  Patient's sore throat is resolved.  Patient is still having some pain in the ears.  Patient's abdomen is distended, patient is  aware about his enlarged liver.  Patient also complained of red spots which appeared on his arms bilaterally which seem consistent with spider angiomata.  Patient drinks alcohol 4-5 beers per day for the last 15 years.      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  Did not show liver cirrhosis.  Start normal saline at 75 cc an hour.  Check CRP.  Check hepatitis panel.  Start oxycodone  5 mg prn q.6 hours.  Potassium improved.  Continue cefdinir  for history of sore throat and ciprofloxacin  and Decadron  for otitis externa.  Continue thiamine  folic acid  and multivitamin.  Continue Pepcid .  Continue Aldactone  50 mg daily.  CIWA protocol in place.  Orthopedics consulted for AVN with fracture.  Endocrine on board for new onset diabetes mellitus.  Appreciate input.  PTOT/he has management.  04/19  Discussed with patient     Ultrasound findings.  GI was consulted consult.  Consult patient on alcohol cessation.  Patient needs referral for interventional pain.  Outpatient follow up with Orthopedics.  Patient had episode of nausea and vomiting in the morning.  Patient on enough to be discharged home electrolytes were replaced.  Patient will likely need to go home on metformin.  And close follow up with PCP for diabetes.  If patient feeling better patient to be discharged tomorrow.    04/20  Patient doing well. Nausea improved. Diarrhea improved. Discharge tomorrow after final endocrinology recs. O/p f/u with orthopedics and interventional pain. Started coreg  for essential hypertension.      Theodoro Koval, MD

## 2023-10-08 NOTE — Nurses Notes (Signed)
 Patient tolerated medications well.  Asked for something for pain.  PRN medications given.  Patient asking for regular soda.  Diet soda given.  Educated on DM and regular soda.  Up to restroom several times. Ambulating around room.  Tolerating meals.  Sitting on side of bed with call light within reach.

## 2023-10-09 ENCOUNTER — Encounter (HOSPITAL_COMMUNITY): Payer: Self-pay | Admitting: Internal Medicine

## 2023-10-09 ENCOUNTER — Inpatient Hospital Stay (HOSPITAL_COMMUNITY)

## 2023-10-09 DIAGNOSIS — M879 Osteonecrosis, unspecified: Secondary | ICD-10-CM

## 2023-10-09 DIAGNOSIS — Z8249 Family history of ischemic heart disease and other diseases of the circulatory system: Secondary | ICD-10-CM

## 2023-10-09 DIAGNOSIS — E860 Dehydration: Secondary | ICD-10-CM

## 2023-10-09 DIAGNOSIS — K760 Fatty (change of) liver, not elsewhere classified: Secondary | ICD-10-CM

## 2023-10-09 DIAGNOSIS — Z7951 Long term (current) use of inhaled steroids: Secondary | ICD-10-CM

## 2023-10-09 DIAGNOSIS — H6090 Unspecified otitis externa, unspecified ear: Secondary | ICD-10-CM

## 2023-10-09 DIAGNOSIS — Y909 Presence of alcohol in blood, level not specified: Secondary | ICD-10-CM

## 2023-10-09 DIAGNOSIS — F10139 Alcohol abuse with withdrawal, unspecified: Secondary | ICD-10-CM

## 2023-10-09 LAB — CBC
HCT: 42.5 % (ref 36.7–47.1)
HGB: 13.9 g/dL (ref 12.5–16.3)
MCH: 31.2 pg (ref 23.8–33.4)
MCHC: 32.8 g/dL (ref 32.5–36.3)
MCV: 95.1 fL (ref 73.0–96.2)
MPV: 7.9 fL (ref 7.4–11.4)
PLATELETS: 310 10*3/uL (ref 140–440)
RBC: 4.47 10*6/uL (ref 4.06–5.63)
RDW: 18.6 % — ABNORMAL HIGH (ref 12.1–16.2)
WBC: 12 10*3/uL — ABNORMAL HIGH (ref 3.6–10.2)

## 2023-10-09 LAB — COMPREHENSIVE METABOLIC PANEL, NON-FASTING
ALBUMIN/GLOBULIN RATIO: 1.1 (ref 0.8–1.4)
ALBUMIN: 3.6 g/dL (ref 3.5–5.7)
ALKALINE PHOSPHATASE: 131 U/L — ABNORMAL HIGH (ref 34–104)
ALT (SGPT): 60 U/L — ABNORMAL HIGH (ref 7–52)
ANION GAP: 8 mmol/L (ref 4–13)
AST (SGOT): 106 U/L — ABNORMAL HIGH (ref 13–39)
BILIRUBIN TOTAL: 0.8 mg/dL (ref 0.3–1.0)
BUN/CREA RATIO: 6 (ref 6–22)
BUN: 3 mg/dL — ABNORMAL LOW (ref 7–25)
CALCIUM, CORRECTED: 9.5 mg/dL (ref 8.9–10.8)
CALCIUM: 9.2 mg/dL (ref 8.6–10.3)
CHLORIDE: 101 mmol/L (ref 98–107)
CO2 TOTAL: 28 mmol/L (ref 21–31)
CREATININE: 0.53 mg/dL — ABNORMAL LOW (ref 0.60–1.30)
ESTIMATED GFR: 126 mL/min/{1.73_m2} (ref >59)
GLOBULIN: 3.2 (ref 2.0–3.5)
GLUCOSE: 137 mg/dL — ABNORMAL HIGH (ref 74–109)
OSMOLALITY, CALCULATED: 273 mosm/kg (ref 270–290)
POTASSIUM: 3.9 mmol/L (ref 3.5–5.1)
PROTEIN TOTAL: 6.8 g/dL (ref 6.4–8.9)
SODIUM: 137 mmol/L (ref 136–145)

## 2023-10-09 LAB — POC BLOOD GLUCOSE (RESULTS)
GLUCOSE, POC: 149 mg/dL — ABNORMAL HIGH (ref 70–100)
GLUCOSE, POC: 202 mg/dL — ABNORMAL HIGH (ref 70–100)
GLUCOSE, POC: 159 mg/dL — ABNORMAL HIGH (ref 70–100)
GLUCOSE, POC: 128 mg/dL — ABNORMAL HIGH (ref 70–100)

## 2023-10-09 LAB — MAGNESIUM: MAGNESIUM: 1.8 mg/dL — ABNORMAL LOW (ref 1.9–2.7)

## 2023-10-09 MED ORDER — CARVEDILOL 3.125 MG TABLET: 3.1250 mg | ORAL_TABLET | Freq: Two times a day (BID) | ORAL | 0 refills | Status: AC

## 2023-10-09 MED ORDER — MULTIVITAMIN WITH FOLIC ACID 400 MCG TABLET
1.0000 | ORAL_TABLET | Freq: Every day | ORAL | 0 refills | Status: AC
Start: 2023-10-09 — End: 2023-11-08

## 2023-10-09 MED ORDER — DICYCLOMINE 20 MG TABLET
20.0000 mg | ORAL_TABLET | Freq: Four times a day (QID) | ORAL | 0 refills | Status: AC
Start: 2023-10-09 — End: 2023-11-08

## 2023-10-09 MED ORDER — LANTUS SOLOSTAR U-100 INSULIN 100 UNIT/ML (3 ML) SUBCUTANEOUS PEN
10.0000 [IU] | PEN_INJECTOR | Freq: Every evening | SUBCUTANEOUS | 0 refills | Status: DC
Start: 2023-10-09 — End: 2024-04-03

## 2023-10-09 MED ORDER — PYRIDOXINE (VITAMIN B6) 100 MG TABLET
100.0000 mg | ORAL_TABLET | Freq: Every day | ORAL | 0 refills | Status: AC
Start: 2023-10-09 — End: 2023-11-08

## 2023-10-09 MED ORDER — THIAMINE MONONITRATE (VITAMIN B1) 100 MG TABLET
100.0000 mg | ORAL_TABLET | Freq: Every day | ORAL | 0 refills | Status: DC
Start: 2023-10-09 — End: 2023-11-01

## 2023-10-09 MED ORDER — POLYETHYLENE GLYCOL 3350 17 GRAM ORAL POWDER PACKET
17.0000 g | Freq: Every day | ORAL | Status: DC
Start: 2023-10-09 — End: 2023-10-10
  Administered 2023-10-09 – 2023-10-10 (×2): 17 g via ORAL
  Filled 2023-10-09 (×2): qty 1

## 2023-10-09 MED ORDER — FOLIC ACID 1 MG TABLET
1.0000 mg | ORAL_TABLET | Freq: Every day | ORAL | 0 refills | Status: AC
Start: 2023-10-09 — End: 2023-11-08

## 2023-10-09 MED ORDER — LACTOBACILLUS RHAMNOSUS GG 10 BILLION CELL CAPSULE
1.0000 | ORAL_CAPSULE | Freq: Three times a day (TID) | ORAL | 0 refills | Status: AC
Start: 2023-10-09 — End: 2023-11-01

## 2023-10-09 MED ORDER — DICYCLOMINE 20 MG TABLET
20.0000 mg | ORAL_TABLET | Freq: Four times a day (QID) | ORAL | Status: DC
Start: 2023-10-09 — End: 2023-10-10
  Administered 2023-10-09 – 2023-10-10 (×4): 20 mg via ORAL
  Filled 2023-10-09 (×4): qty 1

## 2023-10-09 NOTE — Care Plan (Signed)
 Problem: Adult Inpatient Plan of Care  Goal: Plan of Care Review  Outcome: Ongoing (see interventions/notes)  Goal: Patient-Specific Goal (Individualized)  Outcome: Ongoing (see interventions/notes)  Flowsheets (Taken 10/09/2023 1937)  Individualized Care Needs: monitor labs, vitals, pain  Anxieties, Fears or Concerns: wants pain medication to go home with  Goal: Absence of Hospital-Acquired Illness or Injury  Outcome: Ongoing (see interventions/notes)  Intervention: Identify and Manage Fall Risk  Recent Flowsheet Documentation  Taken 10/09/2023 1937 by Missy Amos, RN  Safety Promotion/Fall Prevention:   fall prevention program maintained   nonskid shoes/slippers when out of bed   safety round/check completed   toileting scheduled  Intervention: Prevent Skin Injury  Recent Flowsheet Documentation  Taken 10/09/2023 1937 by Missy Amos, RN  Body Position: side lying, left  Skin Protection:   adhesive use limited   tubing/devices free from skin contact  Intervention: Prevent and Manage VTE (Venous Thromboembolism) Risk  Recent Flowsheet Documentation  Taken 10/09/2023 1937 by Missy Amos, RN  VTE Prevention/Management:   ambulation promoted   anticoagulant therapy maintained   dorsiflexion/plantar flexion performed  Intervention: Prevent Infection  Recent Flowsheet Documentation  Taken 10/09/2023 1937 by Missy Amos, RN  Infection Prevention:   personal protective equipment utilized   promote handwashing   rest/sleep promoted  Goal: Optimal Comfort and Wellbeing  Outcome: Ongoing (see interventions/notes)  Intervention: Provide Person-Centered Care  Recent Flowsheet Documentation  Taken 10/09/2023 1937 by Missy Amos, RN  Trust Relationship/Rapport:   care explained   questions answered   questions encouraged  Goal: Rounds/Family Conference  Outcome: Ongoing (see interventions/notes)     Problem: Fall Injury Risk  Goal: Absence of Fall and Fall-Related Injury  Outcome: Ongoing (see interventions/notes)  Intervention: Identify and Manage  Contributors  Recent Flowsheet Documentation  Taken 10/09/2023 1937 by Missy Amos, RN  Self-Care Promotion: independence encouraged  Medication Review/Management: medications reviewed  Intervention: Promote Injury-Free Environment  Recent Flowsheet Documentation  Taken 10/09/2023 1937 by Missy Amos, RN  Safety Promotion/Fall Prevention:   fall prevention program maintained   nonskid shoes/slippers when out of bed   safety round/check completed   toileting scheduled     Problem: Electrolyte Imbalance  Goal: Electrolyte Balance  Outcome: Ongoing (see interventions/notes)

## 2023-10-09 NOTE — Care Management Notes (Addendum)
 Received a referral for a glucometer.  Provider was advised that this can be provided by his pharmacy.  She advised that a referral for the glucometer was sent to the pharmacy.  Diabetic educator advised that patient would like a CGM.  Spoke with [facility] and they advised that they are unable to bill Medicaid for a CGM.

## 2023-10-09 NOTE — Consults (Signed)
 [facility]  Palliative Care Nurse Practitioner  Consult Note    [name], [name], 48 y.o. male  Date of Birth:  1976-05-10  MRN: [id number]  Admit Date: 10/05/2023   Attending: Hospitalist  Quin Brush, DO   Reason for Consult: communication with patient/family, pain management  Requesting provider: Joline Ned, PA-C    Chief Complaint:  Abnormal labs    HPI:  [name] is a 48 y.o. male who presented to the ED at the recommendation of PCP for abnormal labs. Pt reported that his liver enzymes were elevated.  Pt has hx of chronic back pain with significant lumbar disc disease worsened by a work accident in 2023.  He had surgery about a month ago at [facility].  He has back pain and has been self medicating with alcohol.  He reports drinking 4-5 drinks nightly that have 10% alcohol.  Wbc 14.9, K+ 2.8, na 135, glucose 295, alt 65, ast 105, alk phosph 180, mag 1.8, ethanol 176, A1c 7.3, lactic acid 5.2.  CT abd/pelvis hepatomegaly increased associated with heterogeneous decreased background attenuation, may represent progressive hepatic steatosis vs nonspecific acute hepatitis, AVN bilateral femoral heads.  Liver US  hepatic steatosis.  Pt was admitted with lactic acidosis, new onset dm, alcohol abuse, COPD, hypokalemia, hypomagnesemia, chronic back pain, hepatomegaly/hepatic steatosis, AVN, otitis media/otitis externa.  Ortho was consulted for AVN with recommendations activity as pain allows, follow up as outpatient to discuss total hip arthroplasty in the future. Endocrinology was consulted and made recommendations for Lantus  at DC.  Pt being tx with inhaler, insulin , oxycodone , ciwa protocol, omnicef  & ciprodex  for otitis media/otitis externa.  Pt developed abdominal cramping this morning, had a KUB which showed mild ileus gas pattern, hospitalist ordered bentyl , Miralax , npo until another BM, KUB in am.      Subjective:  PT sitting up in bed.  He was talking to his wife on the phone.  Pt  states the abdominal cramping started this morning.  He states it has eased up.  Denied nausea/vomiting.    Review of Systems:  ROS: Other than ROS in the HPI, all other systems were negative.    Past Medical History:   Diagnosis Date    Asthma     Bleeding hemorrhoid     Bleeding ulcer     Cervical herniated disc     COPD (chronic obstructive pulmonary disease)     GERD (gastroesophageal reflux disease)     Hx MRSA infection     Hyperlipidemia     Hypertension     Rotator cuff arthropathy, right     Type 2 diabetes mellitus     Ulcerative colitis          Past Surgical History:   Procedure Laterality Date    HX BACK SURGERY      L7 L8 repair    HX CYST REMOVAL      lower back    HX HERNIA REPAIR      HX TONSILLECTOMY            Family Medical History:       Problem Relation (Age of Onset)    Anesthesia Complications Father    Arthritis-osteo Mother, Father    Asthma Mother, Father, Brother, Paternal Grandmother, Paternal Grandfather    Blood Clots Father, Paternal Grandmother, Paternal Grandfather    Cancer Mother, Father    Congestive Heart Failure Father, Paternal Grandmother, Paternal Grandfather    Coronary Artery Disease Father, Paternal Grandmother, Paternal  Grandfather    Diabetes Mother, Father    Heart Attack Father, Paternal Grandmother, Paternal Grandfather    High Cholesterol Mother, Father    Hypertension (High Blood Pressure) Mother, Father    Stroke Father, Maternal Grandmother, Maternal Grandfather, Paternal Grandmother, Paternal Grandfather            Social History     Socioeconomic History    Marital status: Married   Tobacco Use    Smoking status: Every Day     Current packs/day: 1.00     Average packs/day: 1 pack/day for 30.0 years (30.0 ttl pk-yrs)     Types: Cigarettes     Passive exposure: Current    Smokeless tobacco: Never   Vaping Use    Vaping status: Never Used   Substance and Sexual Activity    Alcohol use: Yes     Alcohol/week: 35.0 standard drinks of alcohol     Types: 35 Cans of  beer per week    Drug use: Never    Sexual activity: Not Currently     Social Determinants of Health     Financial Resource Strain: Low Risk  (11/11/2021)    Financial Resource Strain     SDOH Financial: No   Transportation Needs: Low Risk  (11/11/2021)    Transportation Needs     SDOH Transportation: No   Social Connections: Low Risk  (10/06/2023)    Social Connections     SDOH Social Isolation: 5 or more times a week   Intimate Partner Violence: Low Risk  (11/11/2021)    Intimate Partner Violence     SDOH Domestic Violence: No   Housing Stability: Low Risk  (11/11/2021)    Housing Stability     SDOH Housing Situation: I have housing.     SDOH Housing Worry: No       Current Outpatient Medications   Medication Instructions    albuterol  sulfate (PROVENTIL  OR VENTOLIN  OR PROAIR ) 90 mcg/actuation Inhalation oral inhaler 1-2 Puffs, Inhalation, EVERY 6 HOURS PRN    budesonide  (ENTOCORT EC ) 9 mg, Oral    budesonide -formoteroL  (SYMBICORT ) 160-4.5 mcg/actuation Inhalation oral inhaler 2 Puffs, Inhalation, 2 TIMES DAILY    carvediloL  (COREG ) 3.125 mg, Oral, 2 TIMES DAILY WITH FOOD    cefdinir  (OMNICEF ) 300 mg, Oral, EVERY 12 HOURS    cefdinir  (OMNICEF ) 300 mg, Oral, 2 TIMES DAILY    cholestyramine -sucrose (QUESTRAN ) 4 gram Oral Powder in Packet 1 Packet, Oral, Daily    ciprofloxacin -dexAMETHasone  (CIPRODEX ) 0.3-0.1 % Otic Drops, Suspension instill 4 drops into affected ear(s) by otic route 2 times per day for 7 days    dicyclomine  (BENTYL ) 20 mg, Oral, 4 TIMES DAILY    famotidine  (PEPCID ) 40 mg, Oral    folic acid  (FOLVITE ) 1 mg, Oral, Daily    lactobacillus rhamnosus, GG, (CULTURELLE) 10 billion cell Oral Capsule 1 Capsule, Oral, 3 TIMES DAILY    Lantus  Solostar U-100 Insulin  10 Units, Subcutaneous, NIGHTLY    Mesalamine  (APRISO ) 1,500 mg, Oral, Daily    multivitamin with folic acid  (THERA) 400 mcg Oral Tablet 1 Tablet, Oral, Daily    omeprazole (PRILOSEC) 40 mg, Oral, Daily    ondansetron  (ZOFRAN  ODT) 4 mg, Oral, EVERY 8 HOURS  PRN    pyridoxine  (VITAMIN B6) 100 mg, Oral, Daily    spironolactone  (ALDACTONE ) 50 mg Oral Tablet 1 Tablet, Oral, Daily    tamsulosin  (FLOMAX ) 0.4 mg, Oral, EVERY EVENING AFTER DINNER    thiamine  mononitrate 100 mg, Oral, Daily  Allergies   Allergen Reactions    Noctec [Chloral Hydrate] Anaphylaxis    Penicillins Anaphylaxis    Theophylline Shortness of Breath    Flexeril [Cyclobenzaprine] Mental Status Effect     Homicidal ideation    Prednisone  Myalgia    Steroids [Steroid] NO Steroids unless approved by Attending Physician        Physical Exam:  Constitutional: no distress  Respiratory: Clear to auscultation bilaterally.   Cardiovascular: S1, S2 normal  Gastrointestinal: non-distended, Soft, non-tender, Bowel sounds normal  Extremities: no edema  Integumentary:  Skin warm and dry  Neurologic: Alert and oriented x3    BP 131/80   Pulse 75   Temp 36.4 C (97.5 F)   Resp 20   Ht 1.676 m (5\' 6" )   Wt 86.2 kg (190 lb 0.9 oz)   SpO2 97%   BMI 30.68 kg/m        Pain: Numeric 7     Labs:  Lab Results Today:    Results for orders placed or performed during the hospital encounter of 10/05/23 (from the past 24 hours)   POC BLOOD GLUCOSE (RESULTS)   Result Value Ref Range    GLUCOSE, POC 232 (H) 70 - 100 mg/dl   POC BLOOD GLUCOSE (RESULTS)   Result Value Ref Range    GLUCOSE, POC 168 (H) 70 - 100 mg/dl   POC BLOOD GLUCOSE (RESULTS)   Result Value Ref Range    GLUCOSE, POC 186 (H) 70 - 100 mg/dl   CBC   Result Value Ref Range    WBC 12.0 (H) 3.6 - 10.2 x10^3/uL    RBC 4.47 4.06 - 5.63 x10^6/uL    HGB 13.9 12.5 - 16.3 g/dL    HCT 13.0 86.5 - 78.4 %    MCV 95.1 73.0 - 96.2 fL    MCH 31.2 23.8 - 33.4 pg    MCHC 32.8 32.5 - 36.3 g/dL    RDW 69.6 (H) 29.5 - 16.2 %    PLATELETS 310 140 - 440 x10^3/uL    MPV 7.9 7.4 - 11.4 fL   COMPREHENSIVE METABOLIC PANEL, NON-FASTING   Result Value Ref Range    SODIUM 137 136 - 145 mmol/L    POTASSIUM 3.9 3.5 - 5.1 mmol/L    CHLORIDE 101 98 - 107 mmol/L    CO2 TOTAL 28 21 - 31  mmol/L    ANION GAP 8 4 - 13 mmol/L    BUN 3 (L) 7 - 25 mg/dL    CREATININE 2.84 (L) 0.60 - 1.30 mg/dL    BUN/CREA RATIO 6 6 - 22    ESTIMATED GFR 126 >59 mL/min/1.45m^2    ALBUMIN 3.6 3.5 - 5.7 g/dL    CALCIUM 9.2 8.6 - 13.2 mg/dL    GLUCOSE 440 (H) 74 - 109 mg/dL    ALKALINE PHOSPHATASE 131 (H) 34 - 104 U/L    ALT (SGPT) 60 (H) 7 - 52 U/L    AST (SGOT) 106 (H) 13 - 39 U/L    BILIRUBIN TOTAL 0.8 0.3 - 1.0 mg/dL    PROTEIN TOTAL 6.8 6.4 - 8.9 g/dL    ALBUMIN/GLOBULIN RATIO 1.1 0.8 - 1.4    OSMOLALITY, CALCULATED 273 270 - 290 mOsm/kg    CALCIUM, CORRECTED 9.5 8.9 - 10.8 mg/dL    GLOBULIN 3.2 2.0 - 3.5   MAGNESIUM    Result Value Ref Range    MAGNESIUM  1.8 (L) 1.9 - 2.7 mg/dL   POC BLOOD GLUCOSE (RESULTS)  Result Value Ref Range    GLUCOSE, POC 149 (H) 70 - 100 mg/dl   POC BLOOD GLUCOSE (RESULTS)   Result Value Ref Range    GLUCOSE, POC 202 (H) 70 - 100 mg/dl       Imaging Studies:    Images and Reports reviewed to current date.      ACP:  Advance directives discussed and information provided.    Code Status:  Full Code    GOC:  Patient resides at home with his wife.  We reviewed his current condition and plan of care.  Pt states he had been told he was prediabetic but diabetes is a new diagnosis for him.  Education on DM, complications, and nutrition discussed with patient.  Pt also has a consult to dietician and diabetic educator nurse for further education.  Pt states he had a family member that was a diabetic and knows about the complications and has watched him give insulin  and feels he will be able to do that once taught.  Pt drinks 4-5 10% alcohol content drinks a day.  He reports he does this for pain management for his back.  Pt has dx of hepatic steatosis and we discussed how alcohol and diabetes impacts this.  He states that he has to quit drinking and is needing pain management.  Palliative care was consulted for pain management and I explained to the patient that [facility] does not provide  pain management services but that his insurance is also out of network and is unable to have community palliative services and that he would need to talk to his PCP and get a referral to a pain clinic.  He states that he has talked to his PCP and they are supposed to be working on this but he hasn't heard anything.  I encouraged patient to continue to reach out to his PCP for referral to pain clinic, he voiced understanding.  Pt also has dx of COPD.  He states that he doesn't wear oxygen at home but has an inhaler if he needs it but rarely uses it.  He reports he has a pulmonologist but hasn't seen him for about a year.  He states he had a CPAP at one time for sleep apnea but the DME company took it back because he wasn't wearing it like they wanted him to.  We discussed potential complications that untreated sleep apnea could cause and encouraged patient to follow back up with his pulmonologist.  Education on the disease trajectories of chronic health conditions and impact on overall health discussed, patient voiced understanding and had no questions.       PPS prior to hospitalization: 60%    PPS currently: 60%      Persons present and participating in discussion: Patient      Was the conversation voluntary? Yes      Patient Disposition/Discharge needs:  Home    ACP/GOC time:     20 minutes    Total visit time:     50 minutes including ACP/GOC time, Face to face, reviewing medical records, and documentation time.    Thank you for this consult.    Elisabeth Guild, APRN,NP-C  Palliative Care Nurse Practitioner    This note may have been partially generated using [employer] Fluency Direct system and there may be some incorrect words, spellings, and punctuation that were not noted in checking the note before saving.

## 2023-10-09 NOTE — Progress Notes (Signed)
 [facility]  IP PROGRESS NOTE      George, [name]  Date of Admission:  10/05/2023  Date of Birth:  [birth year]  Date of Service:  10/09/2023    Hospital Day:  LOS: 4 days     History of Present Illness  [name] is a 48 y.o. male seen and examined in follow up for lactic acidosis, new onset diabetes, alcohol abuse, cirrhosis, chronic obstructive pulmonary disease, hypertension, hypokalemia, hypomagnesemia, dehydration, avascular necrosis of the hips, ear pain.  Patient is sitting up on side of bed.  Denies any new current concerns.  10/08/2023 Patient seen and examined at bedside. Patient requesting glucometer when he is discharged and to be referred to pain clinic. States he only drinks to ease the pain. He has not thought about alcohol the whole time he has been here because we are giving him pain medication and his pain is controlled. Endocrinology would like to keep patient another night to monitor accu checks.   10/09/2023 Patient seen and examined. Complaining of abdominal cramping that just started this am. Had a BM. No n/v.  Eating.   No other complaints voiced.   Review of Systems  Other than ROS in the HPI, all other systems were negative    Vital Signs:  Temp (24hrs) Max:37 C (98.6 F)      Temperature: 36.4 C (97.5 F)  BP (Non-Invasive): (!) 150/98  MAP (Non-Invasive): 83 mmHG  Heart Rate: 75  Respiratory Rate: 20  SpO2: 97 %    Current Medications:  aluminum -magnesium  hydroxide-simethicone  (MAG-AL PLUS) 200-200-20 mg per 5 mL oral liquid, 30 mL, Oral, Q4H PRN  budesonide  (ENTOCORT EC ) enteric coated capsule, 9 mg, Oral, QAM  budesonide -formoterol  (SYMBICORT ) 160 mcg-4.5 mcg per inhalation oral inhaler - "Respiratory to administer", 2 Puff, Inhalation, 2x/day  carvedilol  (COREG ) tablet, 3.125 mg, Oral, 2x/day-Food  cefdinir  (OMNICEF ) capsule, 300 mg, Oral, Q12H  cholestyramine -sucrose (QUESTRAN ) 4 gram packet, 1 Packet, Oral, Daily  ciprofloxacin -dexAMETHasone   (CIPRODEX ) 0.3%-0.1% otic suspension, 4 Drop, Both Ears, 2x/day  Correction/SSIP insulin  lispro 100 units/mL injection, 1-5 Units, Subcutaneous, 4x/day AC  dextrose  (GLUTOSE) 40% oral gel, 15 g, Oral, Q15 Min PRN  dextrose  50% (0.5 g/mL) injection - syringe, 12.5 g, Intravenous, Q15 Min PRN  dicyclomine  (BENTYL ) tablet, 20 mg, Oral, 4x/day  famotidine  (PEPCID ) tablet, 40 mg, Oral, Daily  folic acid  (FOLVITE ) tablet, 1 mg, Oral, Daily  glucagon  injection 1 mg, 1 mg, IntraMUSCULAR, Once PRN  insulin  glargine 100 units/mL injection, 10 Units, Subcutaneous, Daily  LORazepam  (ATIVAN ) 2 mg/mL injection, 1 mg, Intravenous, Q2H PRN  magnesium  hydroxide (MILK OF MAGNESIA) 400mg  per 5mL oral liquid, 30 mL, Oral, HS PRN  mesalamine  (DELZICOL ) capsule with delayed release tablets inside, 1,200 mg, Oral, Daily  multivitamin (THERA) tablet, 1 Tablet, Oral, Daily  nicotine  (NICODERM CQ ) transdermal patch (mg/24 hr), 21 mg, Transdermal, Daily  ondansetron  (ZOFRAN ) 2 mg/mL injection, 4 mg, Intravenous, Q8H PRN  oxyCODONE  (ROXICODONE ) immediate release tablet, 5 mg, Oral, Q6H PRN  pantoprazole  (PROTONIX ) delayed release tablet, 40 mg, Oral, Daily  polyethylene glycol (MIRALAX ) oral packet, 17 g, Oral, Daily  potassium chloride  (K-DUR) extended release tablet, 20 mEq, Oral, Daily with Breakfast  pyridOXINE  Vitamin B6 tablet, 100 mg, Oral, Daily  spironolactone  (ALDACTONE ) tablet, 50 mg, Oral, Daily  thiamine -vitamin B1 tablet, 100 mg, Oral, Daily  traZODone  (DESYREL ) tablet, 50 mg, Oral, HS PRN - MR x 1        Current Orders:  Active Orders  Imaging    XR KUB AND UPRIGHT ABDOMEN     Frequency: ONE TIME     Number of Occurrences: 1 Occurrences     Scheduling Instructions:               Diet    DIET NPO - NOW EXCEPT ALL MEDS WITH SIPS OF WATER      Frequency: All Meals     Number of Occurrences: 1 Occurrences   Nursing    APPLY SEQUENTIAL COMPRESSION DEVICE     Frequency: ONE TIME     Number of Occurrences: 1 Occurrences    HYPOGLYCEMIA  MANAGEMENT - CONSCIOUS PATIENT W/DIET ORDER     Frequency: UNTIL DISCONTINUED     Number of Occurrences: Until Specified    HYPOGLYCEMIA MANAGEMENT - UNCONSCIOUS/ALTERED/NPO PATIENT     Frequency: UNTIL DISCONTINUED     Number of Occurrences: Until Specified    HYPOGLYCEMIA TREATMENT ALGORITHM     Frequency: UNTIL DISCONTINUED     Number of Occurrences: Until Specified    INTAKE AND OUTPUT Q4H     Frequency: Q4H     Number of Occurrences: Until Specified    MAINTAIN SEQUENTIAL COMPRESSION DEVICE     Frequency: CONTINUOUS     Number of Occurrences: Until Specified    Notify MD Vital Signs     Frequency: PRN     Number of Occurrences: Until Specified    NURSE TO ENTER SECONDARY ORDER Other - (specify in comments) (CHEST PAIN AND/OR ARRYTHMIA)     Frequency: UNTIL DISCONTINUED     Number of Occurrences: Until Specified    PT IS INTERMEDIATE RISK FOR VENOUS THROMBOEMBOLISM     Frequency: CONTINUOUS     Number of Occurrences: Until Specified    PULSE OXIMETRY Q4H     Frequency: Q4H     Number of Occurrences: Until Specified    REFERRAL TO [email or web address] - FOR ENROLLMENT IN FREE, ONLINE GROUP CLASSES FOR TOBACCO CESSATION     Frequency: ONE TIME     Number of Occurrences: 1 Occurrences     Order Comments: Visit [email or web address] to access FREE online modules to take at your own pace or group classes to help you quit tobacco.       Scheduling Instructions:      Visit [email or web address] to access FREE online modules to take at your own pace or group classes to help you quit tobacco.    TELEMETRY MONITORING - Continuous     Frequency: CONTINUOUS     Number of Occurrences: Until Specified    TOBACCO CESSATION: I HAVE DISCUSSED REFERRAL FOR TOBACCO CESSATION COUNSELING AND INITIATION OF NICOTINE  REPLACMENT THERAPY WITH THIS PATIENT.     Frequency: ONE TIME     Number of Occurrences: 1 Occurrences    VITAL SIGNS  Q4H     Frequency: Q4H     Number of Occurrences: 250 Occurrences   Consult    IP CONSULT TO CARE MANAGEMENT      Frequency: ONE TIME     Number of Occurrences: 1 Occurrences    IP CONSULT TO ENDOCRINE/METABOLIC - TELEMEDICINE     Frequency: ONE TIME     Number of Occurrences: 1 Occurrences    IP CONSULT TO GASTROENTEROLOGY Requested Provider; Wenceslao Haller     Frequency: ONE TIME     Number of Occurrences: 1 Occurrences    IP CONSULT TO NUTRITION SERVICES Reason for Consult: DIET EDUCATION (must specify what in comments); Other: New Type 2 DM diagnosis  Frequency: ONE TIME     Number of Occurrences: 1 Occurrences    IP CONSULT TO ORTHOPEDICS On-Call Provider (nurse/clerk to determine)     Frequency: ONE TIME     Number of Occurrences: 1 Occurrences    IP CONSULT TO PALLIATIVE CARE     Frequency: ONE TIME     Number of Occurrences: 1 Occurrences   [facility] Services    CIWA-AR ASSESSMENT     Frequency: Q2H     Number of Occurrences: Until Specified    CIWA-AR ASSESSMENT     Frequency: Q4H     Number of Occurrences: Until Specified   Point of Care Testing    PERFORM POC WHOLE BLOOD GLUCOSE     Frequency: TID AC & HS     Number of Occurrences: Until Specified   Schedule Follow Up Orders    REFERRAL TO  QUITLINE - TOBACCO CESSATION     Frequency: ONE TIME     Number of Occurrences: 1 Occurrences   Medications    aluminum -magnesium  hydroxide-simethicone  (MAG-AL PLUS) 200-200-20 mg per 5 mL oral liquid     Frequency: Q4H PRN     Dose: 30 mL     Route: Oral    budesonide  (ENTOCORT EC ) enteric coated capsule     Frequency: QAM     Dose: 9 mg     Route: Oral    budesonide -formoterol  (SYMBICORT ) 160 mcg-4.5 mcg per inhalation oral inhaler - "Respiratory to administer"     Frequency: 2x/day     Dose: 2 Puff     Route: Inhalation    carvedilol  (COREG ) tablet     Frequency: 2x/day-Food     Dose: 3.125 mg     Route: Oral    cefdinir  (OMNICEF ) capsule     Frequency: Q12H     Dose: 300 mg     Route: Oral    cholestyramine -sucrose (QUESTRAN ) 4 gram packet     Frequency: Daily     Dose: 1 Packet     Route: Oral     ciprofloxacin -dexAMETHasone  (CIPRODEX ) 0.3%-0.1% otic suspension     Frequency: 2x/day     Dose: 4 Drop     Route: Both Ears    Correction/SSIP insulin  lispro 100 units/mL injection     Frequency: 4x/day AC     Dose: 1-5 Units     Route: Subcutaneous    dextrose  (GLUTOSE) 40% oral gel     Frequency: Q15 Min PRN     Dose: 15 g     Route: Oral    dextrose  50% (0.5 g/mL) injection - syringe     Frequency: Q15 Min PRN     Dose: 12.5 g     Route: Intravenous    dicyclomine  (BENTYL ) tablet     Frequency: 4x/day     Dose: 20 mg     Route: Oral    famotidine  (PEPCID ) tablet     Frequency: Daily     Dose: 40 mg     Route: Oral    folic acid  (FOLVITE ) tablet     Frequency: Daily     Dose: 1 mg     Route: Oral    glucagon  injection 1 mg     Frequency: Once PRN     Dose: 1 mg     Route: IntraMUSCULAR    insulin  glargine 100 units/mL injection     Frequency: Daily     Dose: 10 Units     Route: Subcutaneous  LORazepam  (ATIVAN ) 2 mg/mL injection     Frequency: Q2H PRN     Dose: 1 mg     Route: Intravenous    magnesium  hydroxide (MILK OF MAGNESIA) 400mg  per 5mL oral liquid     Frequency: HS PRN     Dose: 30 mL     Route: Oral    mesalamine  (DELZICOL ) capsule with delayed release tablets inside     Frequency: Daily     Dose: 1,200 mg     Route: Oral    multivitamin (THERA) tablet     Frequency: Daily     Dose: 1 Tablet     Route: Oral    nicotine  (NICODERM CQ ) transdermal patch (mg/24 hr)     Frequency: Daily     Dose: 21 mg     Route: Transdermal    ondansetron  (ZOFRAN ) 2 mg/mL injection     Frequency: Q8H PRN     Dose: 4 mg     Route: Intravenous    oxyCODONE  (ROXICODONE ) immediate release tablet     Frequency: Q6H PRN     Dose: 5 mg     Route: Oral    pantoprazole  (PROTONIX ) delayed release tablet     Frequency: Daily     Dose: 40 mg     Route: Oral    polyethylene glycol (MIRALAX ) oral packet     Frequency: Daily     Dose: 17 g     Route: Oral    potassium chloride  (K-DUR) extended release tablet     Frequency: Daily with  Breakfast     Dose: 20 mEq     Route: Oral    pyridOXINE  Vitamin B6 tablet     Frequency: Daily     Dose: 100 mg     Route: Oral    spironolactone  (ALDACTONE ) tablet     Frequency: Daily     Dose: 50 mg     Route: Oral    thiamine -vitamin B1 tablet     Frequency: Daily     Dose: 100 mg     Route: Oral    traZODone  (DESYREL ) tablet     Frequency: HS PRN - MR x 1     Dose: 50 mg     Route: Oral        Today's Physical Exam:  Physical Exam  HENT:      Head: Normocephalic.      Ears:      Comments: Both external canals edematous and erythematous, tympanic membranes bilaterally mildly erythematous with poor light reflex mild bulging.       Mouth/Throat:      Mouth: Mucous membranes are moist.   Cardiovascular:      Rate and Rhythm: Normal rate and regular rhythm.      Pulses: Normal pulses.      Heart sounds: Normal heart sounds.   Pulmonary:      Effort: Pulmonary effort is normal.      Breath sounds: Normal breath sounds.   Abdominal:      General: There is distension.      Palpations: Abdomen is soft.   Musculoskeletal:         General: Normal range of motion.   Skin:     General: Skin is warm.      Capillary Refill: Capillary refill takes less than 2 seconds.   Neurological:      Mental Status: He is alert and oriented to person, place, and time.   Psychiatric:  Behavior: Behavior normal.          Consults:  Endocrinology, Orthopedics, patient education, case management    I/O:  I/O last 24 hours:    Intake/Output Summary (Last 24 hours) at 10/09/2023 1258  Last data filed at 10/09/2023 0931  Gross per 24 hour   Intake 680 ml   Output --   Net 680 ml     I/O current shift:  04/21 0700 - 04/21 1859  In: 120 [P.O.:120]  Out: -       Labs  Please indicate ordered or reviewed)  Reviewed: Lab Results Today:    Results for orders placed or performed during the hospital encounter of 10/05/23 (from the past 24 hours)   POC BLOOD GLUCOSE (RESULTS)   Result Value Ref Range    GLUCOSE, POC 232 (H) 70 - 100 mg/dl   POC  BLOOD GLUCOSE (RESULTS)   Result Value Ref Range    GLUCOSE, POC 168 (H) 70 - 100 mg/dl   POC BLOOD GLUCOSE (RESULTS)   Result Value Ref Range    GLUCOSE, POC 186 (H) 70 - 100 mg/dl   CBC   Result Value Ref Range    WBC 12.0 (H) 3.6 - 10.2 x10^3/uL    RBC 4.47 4.06 - 5.63 x10^6/uL    HGB 13.9 12.5 - 16.3 g/dL    HCT 38.7 56.4 - 33.2 %    MCV 95.1 73.0 - 96.2 fL    MCH 31.2 23.8 - 33.4 pg    MCHC 32.8 32.5 - 36.3 g/dL    RDW 95.1 (H) 88.4 - 16.2 %    PLATELETS 310 140 - 440 x10^3/uL    MPV 7.9 7.4 - 11.4 fL   COMPREHENSIVE METABOLIC PANEL, NON-FASTING   Result Value Ref Range    SODIUM 137 136 - 145 mmol/L    POTASSIUM 3.9 3.5 - 5.1 mmol/L    CHLORIDE 101 98 - 107 mmol/L    CO2 TOTAL 28 21 - 31 mmol/L    ANION GAP 8 4 - 13 mmol/L    BUN 3 (L) 7 - 25 mg/dL    CREATININE 1.66 (L) 0.60 - 1.30 mg/dL    BUN/CREA RATIO 6 6 - 22    ESTIMATED GFR 126 >59 mL/min/1.71m^2    ALBUMIN 3.6 3.5 - 5.7 g/dL    CALCIUM 9.2 8.6 - 06.3 mg/dL    GLUCOSE 016 (H) 74 - 109 mg/dL    ALKALINE PHOSPHATASE 131 (H) 34 - 104 U/L    ALT (SGPT) 60 (H) 7 - 52 U/L    AST (SGOT) 106 (H) 13 - 39 U/L    BILIRUBIN TOTAL 0.8 0.3 - 1.0 mg/dL    PROTEIN TOTAL 6.8 6.4 - 8.9 g/dL    ALBUMIN/GLOBULIN RATIO 1.1 0.8 - 1.4    OSMOLALITY, CALCULATED 273 270 - 290 mOsm/kg    CALCIUM, CORRECTED 9.5 8.9 - 10.8 mg/dL    GLOBULIN 3.2 2.0 - 3.5   MAGNESIUM    Result Value Ref Range    MAGNESIUM  1.8 (L) 1.9 - 2.7 mg/dL   POC BLOOD GLUCOSE (RESULTS)   Result Value Ref Range    GLUCOSE, POC 149 (H) 70 - 100 mg/dl   POC BLOOD GLUCOSE (RESULTS)   Result Value Ref Range    GLUCOSE, POC 202 (H) 70 - 100 mg/dl       Diagnostic Tests (Please indicate ordered or reviewed)  Reviewed: I reviewed all new diagnostic tests.    Assessment/ Plan:  Active Hospital Problems   (*Primary Problem)    Diagnosis    *Hypokalemia    Diabetes mellitus, new onset (CMS HCC)    Cirrhosis    Alcohol abuse    Ulcerative colitis    Continuous tobacco abuse    COPD (chronic obstructive pulmonary  disease)    HTN (hypertension)    Lactic acidosis     Otitis media/otitis externa  Cipro  ear drops bilaterally  Omnicef  continued    Lactic acidosis  Possibly secondary to dehydration alcohol abuse and new onset diabetes  Continue IV fluids and hydration    New onset diabetes  Hemoglobin A1c 7.3  Diabetic education requested  Tele endocrinology consulted  Very sensitive sliding scale  Will need glucometer on d/c     Alcohol abuse  CIWA protocol  Thiamine  folic acid   Educated patient needs cessation of alcohol abuse    Hepatomegaly/hepatic steatosis   We will need outpatient follow up with Dr. Alyssa Backbone  Liver ultrasound: hepatic steatosis       Ulcerative colitis-chronic  Continue home medication    Tobacco abuse  Nicotine  patch if needed  Advised to stop smoking    Chronic obstructive pulmonary disease  Continue p.r.n. albuterol     Hypertension  Home medications continued  Monitor on telemetry    Hypokalemia/hypomagnesemia  Possibly secondary to alcohol abuse and poor nutrition  Replacement given  Repeat labs in a.m.    Chronic back pain  We will need outpatient pain specialist  Palliative care consulted     Avascular necrosis of hips  Orthopedic specialist evaluated  We will need replacement and outpatient follow up with Orthopedics  Patient does not want to see orthopedics here , he would like to go elsewhere    Abdominal pain/cramping   -? Mild ileus on KUB   -bentyl , Miralax   -NPO for now until has another BM   -repeat KUB in am     Nutrition:    DIET NPO - NOW EXCEPT ALL MEDS WITH SIPS OF WATER     Additional clinical characteristics related to nutrition:    - monitor for weight changes   - monitor intake and output    - monitor bowel functions        Patient referral to pain specialist due to chronic back pain.  The Hospitalist personally evaluated and examined the patient in conjunction with the MLP and agree with the assessments, treatment plan and disposition of the patient as recorded by the [facility].     Melissa  Lilly, PA-C       DVT/PE Prophylaxis: SCDs/ Venodynes/Impulse boots    Disposition Planning: Home discharge     Patient stated he had sore throat, and infection in the year for which he followed up with PCP he also had lumps/lymph nodes under his chin which were inflamed, patient was prescribed cefdinir  and ciprofloxacin  drops for the ears with Decadron  drops.  Patient's left nodes had markedly improved.  Patient's sore throat is resolved.  Patient is still having some pain in the ears.  Patient's abdomen is distended, patient is aware about his enlarged liver.  Patient also complained of red spots which appeared on his arms bilaterally which seem consistent with spider angiomata.  Patient drinks alcohol 4-5 beers per day for the last 15 years.      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  Did not show liver cirrhosis.  Start normal saline at 75 cc an hour.  Check CRP.  Check hepatitis panel.  Start oxycodone  5 mg prn q.6 hours.  Potassium improved.  Continue cefdinir  for history of sore throat and ciprofloxacin  and Decadron  for otitis externa.  Continue thiamine  folic acid  and multivitamin.  Continue Pepcid .  Continue Aldactone  50 mg daily.  CIWA protocol in place.  Orthopedics consulted for AVN with fracture.  Endocrine on board for new onset diabetes mellitus.  Appreciate input.  PTOT/he has management.     04/19  Discussed with patient     Ultrasound findings.  GI was consulted consult.  Consult patient on alcohol cessation.  Patient needs referral for interventional pain.  Outpatient follow up with Orthopedics.  Patient had episode of nausea and vomiting in the  morning.  Patient on enough to be discharged home electrolytes were replaced.  Patient will likely need to go home on metformin.  And close follow up with PCP for diabetes.  If patient feeling better patient to be discharged tomorrow.    04/20  Patient doing well. Nausea improved. Diarrhea improved. Discharge tomorrow after final endocrinology recs. O/p f/u with orthopedics and interventional pain. Started coreg  for essential hypertension.     04/21  Patient having cramping in abdomen. Xray kub showed mild ileus. NPO. Did have bowel movement this morning. Miralax . Npo with meds. Xray kub in am.     Tolbert Fothergill, MD

## 2023-10-09 NOTE — Nurses Notes (Signed)
 Diabetes Education Note    Patient:  [name]. [name]   Date of Service:  10/09/2023  Location:  [address]    Referral for:  Question Answer Comment   Reasons: DIABETIC EDUCATION    Reason for Consult NEW ONSET DIABETES        Descriptive: Pt sitting up on side of bed.   Has no complaints at this time.   Pt is aware of Type 2 DM diagnosis and A1C of  7.3 and serum glucose on admission 295 .  Pt is open to education on diabetes.      Disease Process:  Pt admitted on  10/05/2023  with Hypokalemia  .      Disease Management:    Gave and reviewed with the pt the BD-Getting started Your guide to understanding diabetes, developing good treatment habits and adopting a healthy lifestyle booklet.  Discussed with the pt the types of DM and the new diagnosis of T2DM.  Reviewed the complications and risk factors related to DM.  Talked about the s/s and treatments for hyperglycemia and hypoglycemia.   Explained to the pt about healthy eating, monitoring and blood glucose logs, exercise benefits and weight loss, medication actions and compliance.   Reviewed how uncontrolled DM affects blood vessels.  Discussed foot, gum and eye care.  Defined A1C and importance of management of blood pressure, cholesterol and A1C to assist with the regulation of DM.  Encouraged pt to limit fast food, process foods and sodium intake.     Insulin  Teaching:  Instructions and return demo of insulin  readings on on a insulin  demo pen.   Pt did well with insulin  readings with several different markings.     Pt able to do the insulin  injections with the insulin  pen  on the cushion model.   Instructions to the pt on injections sites and injection rotation.       Pt does not have a DM meter for home use.   Left a note in the Discharge To Do List that a prescription will be needed on discharge for the DM meter and supplies.  Provided pt with information on CGM with criteria to obtain CGM sensor. Case Management notified of Pt request for CGM.       Instructions  to the pt in use of the DM meter with written instructions given. Provided pt with information on our diabetes care/telemedicine clinic referrals.  Gave and encouraged pt to f/u with the  diabetes education class with the schedule given.  Answered all questions and concerns expressed by the pt.   Supplied my contact information for any other questions that may arise once discharged.

## 2023-10-09 NOTE — Care Plan (Signed)
 Problem: Adult Inpatient Plan of Care  Goal: Plan of Care Review  Outcome: Ongoing (see interventions/notes)  Goal: Patient-Specific Goal (Individualized)  Outcome: Ongoing (see interventions/notes)  Flowsheets (Taken 10/09/2023 0800)  Individualized Care Needs: KUB ordered.  Anxieties, Fears or Concerns: "My stomach is cramping really bad."  Goal: Absence of Hospital-Acquired Illness or Injury  Outcome: Ongoing (see interventions/notes)  Intervention: Identify and Manage Fall Risk  Recent Flowsheet Documentation  Taken 10/09/2023 0800 by Mia Adam, RN  Safety Promotion/Fall Prevention:   activity supervised   fall prevention program maintained   motion sensor pad activated   muscle strengthening facilitated   nonskid shoes/slippers when out of bed   safety round/check completed  Intervention: Prevent Skin Injury  Recent Flowsheet Documentation  Taken 10/09/2023 0800 by Mia Adam, RN  Skin Protection:   adhesive use limited   tubing/devices free from skin contact  Intervention: Prevent and Manage VTE (Venous Thromboembolism) Risk  Recent Flowsheet Documentation  Taken 10/09/2023 0800 by Mia Adam, RN  VTE Prevention/Management:   ambulation promoted   anticoagulant therapy maintained  Goal: Optimal Comfort and Wellbeing  Outcome: Ongoing (see interventions/notes)  Intervention: Provide Person-Centered Care  Recent Flowsheet Documentation  Taken 10/09/2023 0800 by Mia Adam, RN  Trust Relationship/Rapport:   care explained   choices provided   emotional support provided   empathic listening provided   questions answered   questions encouraged   thoughts/feelings acknowledged   reassurance provided  Goal: Rounds/Family Conference  Outcome: Ongoing (see interventions/notes)     Problem: Fall Injury Risk  Goal: Absence of Fall and Fall-Related Injury  Outcome: Ongoing (see interventions/notes)  Intervention: Identify and Manage Contributors  Recent Flowsheet Documentation  Taken 10/09/2023 0800 by Mia Adam, RN  Self-Care Promotion:  independence encouraged  Medication Review/Management: medications reviewed  Intervention: Promote Injury-Free Environment  Recent Flowsheet Documentation  Taken 10/09/2023 0800 by Mia Adam, RN  Safety Promotion/Fall Prevention:   activity supervised   fall prevention program maintained   motion sensor pad activated   muscle strengthening facilitated   nonskid shoes/slippers when out of bed   safety round/check completed     Problem: Electrolyte Imbalance  Goal: Electrolyte Balance  Outcome: Ongoing (see interventions/notes)

## 2023-10-09 NOTE — Nurses Notes (Signed)
 [facility] Medicine [facility]  [address]  [place], [address]  [phone]  (Fax) [phone]  Rehabilitation Department  Physical Therapy    Restorative Aide Note    Date: 10/09/2023  Patient's Name: [name]  Date of Birth: Mar 16, 1976  Height: Height: 167.6 cm (5\' 6" )  Weight: Weight: 86.2 kg (190 lb 0.9 oz)      Total minutes: 15    Patient effort: good    Patient profile reviewed: yes    Medical Lines: TELEMETRY    Respiratory Status:ROOM AIR    Existing precautions/restrictions:FALL PRECAUTIONS    Pre-treatment status: PATIENT SUPINE IN BED    Communication pre-treatment: CHARGE NURSE    Attention/Behavior: WNL/WFL    Pre-treatment HR: not on monitor    Post-treatment HR: not on monitor    PreSpO2: not on monitor    O2 delivery pre-treatment: n/a    Post SpO2: not on monitor    Pre-treatment pain rating: 0/7    Post-treatment pain rating: 0/7    Location of pain: back and legs    Bed mobility (roll, bridge, scoot, supine to sit): self to roll     Sitting assistance:  SBA/CGA    Sit to stand: SBA/CGA    Gait: SBA/CGA ambulated patient 200 feet with one assist, gait belt and cane       Post treatment status: patient on eob with call bell and needs in reach           [employer], PAT CARE [facility]  10/09/2023, 11:38

## 2023-10-09 NOTE — Respiratory Therapy (Signed)
 10/09/23 1949   Aerosol Therapy (SVN)   Start Time 1949   Treatment Status Given   Route (Aerosol Therapy) with spacer;mouth piece   $ Respiratory Treatment (Resp only) MDI (Sub)   Medications Symbicort    Signs of Intolerance (SVN) none   Respiratory Pre/Post-Treatment Assess   Pre-Treatment Heart Rate (beats/min) 79   Pre-Treatment Resp Rate (breaths/min) 16   SpO2 98 %     Pt has previous experience with mdi spacer. Tolerated treatment well

## 2023-10-10 ENCOUNTER — Inpatient Hospital Stay (HOSPITAL_COMMUNITY)

## 2023-10-10 DIAGNOSIS — Y906 Blood alcohol level of 120-199 mg/100 ml: Secondary | ICD-10-CM

## 2023-10-10 DIAGNOSIS — R109 Unspecified abdominal pain: Secondary | ICD-10-CM

## 2023-10-10 LAB — POC BLOOD GLUCOSE (RESULTS)
GLUCOSE, POC: 164 mg/dL — ABNORMAL HIGH (ref 70–100)
GLUCOSE, POC: 154 mg/dL — ABNORMAL HIGH (ref 70–100)

## 2023-10-10 LAB — GAD65 AB ASSAY, S: GAD-65 ANTIBODY: <5 [IU]/mL (ref <5)

## 2023-10-10 NOTE — Care Plan (Signed)
 Medical Nutrition Therapy Assessment    Reason for assessment: DM    SUBJECTIVE : RD met with patient to discuss therapeutic diet recommendations. A1c reviewed.   He has a good appetite.  Admits he has been consuming alcohol to help control the pain.  He is aware of need to avoid sugar sweetened beverages (drinks 2 bottles of regular soda daily).   We discussed food tips for better meal tolerance - he reports UC and Crohns.  Encouraged to increase protein and vegetables to help with satiety.    OBJECTIVE: A1c 7.3    PMH includes:  DM, GERD, UC    Current Diet Order/Nutrition Support:  DIET DIABETIC Calorie amount: CC 2200; Do you want to initiate MNT Protocol? Yes                             Comments: compliance encourage  He is less active due to recent back surgery and bad hip       Plan/Interventions : CC meal plan    Goal: improved A1c        Rona Cobia, RDLD

## 2023-10-10 NOTE — Nurses Notes (Signed)
 [facility] Medicine [facility]  [address]  [place], [address]  [phone]  (Fax) [phone]  Rehabilitation Department  Physical Therapy    Restorative Aide Note    Date: 10/10/2023  Patient's Name: [name]  Date of Birth: 1976/05/26  Height: Height: 167.6 cm (5\' 6" )  Weight: Weight: 86.2 kg (190 lb 0.9 oz)      Total minutes: 10    Patient effort: good    Patient profile reviewed: yes    Medical Lines: TELEMETRY    Respiratory Status:ROOM AIR    Existing precautions/restrictions:FALL PRECAUTIONS    Pre-treatment status: PATIENT SUPINE IN BED    Communication pre-treatment: CHARGE NURSE    Attention/Behavior: WNL/WFL    Pre-treatment HR: not on monitor    Post-treatment HR: not on monitor    PreSpO2: not on monitor    O2 delivery pre-treatment: n/a    Post SpO2: not on monitor    Pre-treatment pain rating: 0/10    Post-treatment pain rating: 0/10    Location of pain: n/a    Bed mobility (roll, bridge, scoot, supine to sit): patient sitting on eob    Sitting assistance:  SBA/CGA    Sit to stand: SBA/CGA    Gait: SBA/CGA ambulated patient 200 feet with gait belt, one assit and straight cane    Post treatment status: patient sitting on eob with call bell and needs in reach          [employer], PAT CARE [facility]  10/10/2023, 11:42

## 2023-10-10 NOTE — Discharge Summary (Signed)
 [facility] MEDIC[facility]     DISCHARGE SUMMARY      PATIENT NAME:  [name]   MRN:  [id number]  DOB:  Aug 04, 1975    INPATIENT ADMISSION DATE: 10/05/2023   DATE OF DISCHARGE:  10/10/23     ATTENDING PHYSICIAN: Lewayne Records, PA-C     PRIMARY CARE PHYSICIAN: Quin Brush, DO     HOSPITAL PRESENTATION:    Please see full admission H&P for details.      As per HPI:    [name] is a 48 y.o., White male who presents at the recommendation of his PCP for abnormal labs. He has a history of chronic back pain with significant lumbar disc disease that was worsened by a work accident in late 2023.  He had conservative therapy for over a year before having surgery at [facility] by Dr. Bethanie Brooking about a month ago.  He has been self medicating with alcohol for his back pain for over a year after previous use was occasional.  He also has ulcerative colitis and chronic obstructive pulmonary disease with smoking.  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  He has previously been told that he did not have cirrhosis just an enlarged liver, but CT changes today along with increasing transaminases show likely cirrhosis.  He sees a Publishing rights manager at Dr. Reynold Caves office and had labs done today which were abnormal and was sent here.  Potassium and magnesium  were low and glucose was 295.  AST and ALT are up at 1:05 a.m. and 65 respectively with an alkaline phosphatase of 1 80.  Serum alcohol level at 6:00 p.m. was 174.  Lactic acid was 5.2 on arrival and is now 6.0.  I have written for another L of normal saline.  Urine glucose is 500.           FURTHER HOSPITAL COURSE WITH DISCHARGE DIAGNOSES:  Patient admitted  with IV hydration, endocrinology consultation. Electrolytes were replaced. 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 Patient to avoid steroids and alcohol and will most  likely need surgery as an OP once other medical conditions improve. Endocrinology has started patient on Lantus  as T1 DM workup pending.  HgbA1c  7.3.     Cefdinir  continued for otitis media. He was seen by peer recovery for alcohol abuse. States if someone would give him pain medications for his chronic low back pain he wouldn't have to drink. Diabetic educator consulted. Palliative care consulted. Patient overall doing well. BP on higher side and coreg  initiated.  Patient did have abdominal cramping yesterday. KUB showed possible mild ileus.   Miralax  given with good results. Repeat KUB WNL this am Patient can be discharged home to f/u with [facility], Fairy Homer for sleep study, his PCP to refer to pain clinic.  The Hospitalist personally evaluated and examined the patient in conjunction with the MLP and agree with the assessments, treatment plan and disposition of the patient as recorded by the [facility].          Problem List:  Active Hospital Problems   (*Primary Problem)    Diagnosis    *Hypokalemia    Diabetes mellitus, new onset (CMS HCC)    Cirrhosis    Alcohol abuse    Ulcerative colitis    Continuous tobacco abuse    COPD (chronic obstructive pulmonary disease)    HTN (hypertension)  Lactic acidosis        Avascular necrosis bilateral hips     Nutrition:    DIET DIABETIC Calorie amount: CC 2200; Do you want to initiate MNT Protocol? Yes    Additional clinical characteristics related to nutrition:    - monitor for weight changes   - monitor intake and output    - monitor bowel functions                    PHYSICAL EXAM   DAY OF DISCHARGE:    BP 124/82   Pulse 78   Temp 36.7 C (98 F)   Resp 20   Ht 1.676 m (5\' 6" )   Wt 86.2 kg (190 lb 0.9 oz)   SpO2 98%   BMI 30.68 kg/m          General:  Patient is resting in bed, no acute distress, alert and oriented x3   Eyes:  PERRL, no scleral icterus   HENT:  Normocephalic, atraumatic, oral mucosa is moist and pink, no nasal discharge   Heart:  RRR, S1  and S2 auscultated, no murmurs appreciated   Lungs:  Unlabored respirations.  Lungs are clear to auscultation bilaterally, no wheezes, no rales  Abdomen:  Soft, active bowel sounds, non-tender to palpation, non-distended  Extremities: .  No edema in lower extremities bilaterally   Skin:  Warm and dry.  Not diaphoretic  Neuro:  A&O x 3.  No focal deficits.  Speech intact.  Not tremulous  Psych:  Cooperative, not agitated    LABS:  CBC with Diff (Last 48 Hours):    Recent Results in last 48 hours     10/09/23  0548   WBC 12.0*   HGB 13.9   HCT 42.5   MCV 95.1   PLTCNT 310          Last BMP  (Last result in the past 48 hours)        Na   K   Cl   CO2   BUN   Cr   Calcium   Glucose   Glucose-Fasting        10/09/23 0548 137   3.9   101   28   3   0.53   9.2   137               Last Hepatic Panel  (Last result in the past 48 hours)        Albumin   Total PTN   Total Bili   Direct Bili   Ast/SGOT   Alt/SGPT   Alk Phos        10/09/23 0548 3.6   6.8   0.8     106   60   131                BMP (Last 48 Hours):    Recent Results in last 48 hours     10/09/23  0548   SODIUM 137   POTASSIUM 3.9   CHLORIDE 101   CO2 28   BUN 3*   CREATININE 0.53*   CALCIUM 9.2   GLUCOSENF 137*          DISCHARGE MEDICATIONS:     Current Discharge Medication List        START taking these medications.        Details   carvediloL  3.125 mg Tablet  Commonly known as: COREG    3.125 mg, Oral, 2 TIMES  DAILY WITH FOOD  Qty: 60 Tablet  Refills: 0     dicyclomine  20 mg Tablet  Commonly known as: BENTYL    20 mg, Oral, 4 TIMES DAILY  Qty: 120 Tablet  Refills: 0     folic acid  1 mg Tablet  Commonly known as: FOLVITE    1 mg, Oral, Daily  Qty: 30 Tablet  Refills: 0     lactobacillus rhamnosus (GG) 10 billion cell Capsule  Commonly known as: CULTURELLE   1 Capsule, Oral, 3 TIMES DAILY  Qty: 21 Capsule  Refills: 0     Lantus  Solostar U-100 Insulin  100 unit/mL (3 mL) Insulin  Pen  Generic drug: insulin  glargine   10 Units, Subcutaneous, NIGHTLY  Qty: 3  mL  Refills: 0     multivitamin with folic acid  400 mcg Tablet  Commonly known as: THERA   1 Tablet, Oral, Daily  Qty: 30 Tablet  Refills: 0     pyridoxine  100 mg Tablet  Commonly known as: VITAMIN B6   100 mg, Oral, Daily  Qty: 30 Tablet  Refills: 0     thiamine  mononitrate 100 mg Tablet   100 mg, Oral, Daily  Qty: 30 Tablet  Refills: 0            CONTINUE these medications which have CHANGED during your visit.        Details   cefdinir  300 mg Capsule  Commonly known as: OMNICEF   What changed: when to take this   300 mg, Oral, 2 TIMES DAILY  Qty: 14 Capsule  Refills: 0            CONTINUE these medications - NO CHANGES were made during your visit.        Details   albuterol  sulfate 90 mcg/actuation oral inhaler  Commonly known as: PROVENTIL  or VENTOLIN  or PROAIR    1-2 Puffs, Inhalation, EVERY 6 HOURS PRN  Refills: 0     Apriso  0.375 gram Capsule, Sust. Release 24 hr  Generic drug: Mesalamine    1,500 mg, Oral, Daily  Refills: 0     budesonide  3 mg Capsule, Delayed & Ext.Release  Commonly known as: ENTOCORT EC    9 mg, Oral  Refills: 0     budesonide -formoteroL  160-4.5 mcg/actuation oral inhaler  Commonly known as: SYMBICORT    2 Puffs, Inhalation, 2 TIMES DAILY  Refills: 0     cholestyramine -sucrose 4 gram Powder in Packet  Commonly known as: QUESTRAN    1 Packet, Oral, Daily  Refills: 0     ciprofloxacin -dexAMETHasone  0.3-0.1 % Drops, Suspension  Commonly known as: CIPRODEX    instill 4 drops into affected ear(s) by otic route 2 times per day for 7 days  Refills: 0     famotidine  40 mg Tablet  Commonly known as: PEPCID    40 mg, Oral  Refills: 0     omeprazole 40 mg Capsule, Delayed Release(E.C.)  Commonly known as: PRILOSEC   40 mg, Oral, Daily  Refills: 0     ondansetron  4 mg Tablet, Rapid Dissolve  Commonly known as: ZOFRAN  ODT   4 mg, Oral, EVERY 8 HOURS PRN  Qty: 12 Tablet  Refills: 0     spironolactone  50 mg Tablet  Commonly known as: ALDACTONE    1 Tablet, Oral, Daily  Refills: 0     tamsulosin  0.4 mg  Capsule  Commonly known as: FLOMAX    0.4 mg, Oral, EVERY EVENING AFTER DINNER  Qty: 30 Capsule  Refills: 0  DISCHARGE DISPOSITION:  home     DISCHARGE INSTRUCTIONS:    Follow-up Information       Quin Brush, DO. Schedule an appointment as soon as possible for a visit in 1 week(s).    Specialty: FAMILY MEDICINE  Why: f/u after hospitalization   new onset dm   avascular necrosis    Appointment 10/17/23 @145    Dr Onesimo Bijou will refer to a pain clinic please bring this up at your appointment  Contact information:  [address]  [place] [address]  [phone]               Fairy Homer, FNP-BC. Schedule an appointment as soon as possible for a visit in 1 month(s).    Specialties: SLEEP MEDICINE, PULMONARY DISEASE  Why: sleep study    Appointment 6 17 25  @ 530  Office is in [facility] 2nd floor near DaySurgery  Contact information:  [address]  [place] [address]  [phone]               Wardell Guys, FNP-C Follow up.    Specialty: FAMILY NURSE PRACTITIONER  Why: Appointment 11/01/23 @ 1045   Endocrinology for tele endo   new onset DM  Contact information:  [address]  [place] [address]  [phone]               Nori Beat, DO Follow up.    Specialty: ORTHOPAEDIC SURGERY  Why: avascular necrosis bilateral hips    Appointment 10/20/23 @850   Contact information:  [address]  [place] [address]  [phone]               Wenceslao Haller, MD Follow up.    Specialty: GASTROENTEROLOGY  Why: Liver failure    Appointment 12/07/23 @ 230  Contact information:  [address]  [place] [address]  [phone]                              DME - GLUCOMETER    Will need lancets and strips for 1 month taking glucose ac and hs   Will also need alcohol pads     Freedom of Choice: I have informed patient of their freedom of choice with respect to DME providers       Follow-up Information       Quin Brush, DO. Schedule an appointment as soon as possible for a visit in 1 week(s).    Specialty:  FAMILY MEDICINE  Why: f/u after hospitalization   new onset dm   avascular necrosis    Appointment 10/17/23 @145    Dr Onesimo Bijou will refer to a pain clinic please bring this up at your appointment  Contact information:  [address]  [place] [address]  [phone]               Fairy Homer, FNP-BC. Schedule an appointment as soon as possible for a visit in 1 month(s).    Specialties: SLEEP MEDICINE, PULMONARY DISEASE  Why: sleep study    Appointment 6 17 25  @ 530  Office is in [facility] 2nd floor near DaySurgery  Contact information:  [address]  [place] [address]  [phone]               Wardell Guys, FNP-C Follow up.    Specialty: FAMILY NURSE PRACTITIONER  Why: Appointment 11/01/23 @ 1045   Endocrinology for tele endo   new onset  DM  Contact information:  [address]  [place] [address]  [phone]               Nori Beat, DO Follow up.    Specialty: ORTHOPAEDIC SURGERY  Why: avascular necrosis bilateral hips    Appointment 10/20/23 @850   Contact information:  [address]  [place] [address]  [phone]               Wenceslao Haller, MD Follow up.    Specialty: GASTROENTEROLOGY  Why: Liver failure    Appointment 12/07/23 @ 230  Contact information:  [address]  [place] [address]  [phone]                                    Copies sent to Care Team         Relationship Specialty Notifications Start End    Peters, Jana, DO PCP - General FAMILY PRACTICE  09/16/20     Phone: [phone] Fax: [phone]         [address] [place] [address]            >30 minutes total were spent coordinating discharge day today      Kito Cuffe D Steffan Caniglia, PA-C  [place] MEDICINE HOSPITALIST

## 2023-10-10 NOTE — Nurses Notes (Signed)
 Pt is alert and oriented x4, pt is concerned about pain management at home as he uses alcohol to decrease the sensation of pain. Pt participates with physical therapy at this time. Pt teaching reinforced by nutrition as well as nursing.

## 2023-10-11 ENCOUNTER — Telehealth (HOSPITAL_COMMUNITY): Payer: Self-pay | Admitting: Family Medicine

## 2023-10-11 ENCOUNTER — Ambulatory Visit (HOSPITAL_COMMUNITY): Payer: Self-pay | Admitting: INTERNAL MEDICINE-ENDOCRINOLOGY-DIABETES AND METABOLISM

## 2023-10-11 NOTE — Telephone Encounter (Signed)
 Transition of Care Contact Information  Discharge Date: 10/10/2023  Transition Facility Type--Hospital (Inpatient or Observation)  Facility Name--[facility]  Interactive Contact(s): Completed or attempted contact indicated by Date/Time  Completed Contact: 10/11/2023 12:27 PM  Contact Method(s)-- Patient/Caregiver Telephone, MyChart Patient Portal  Clinical Staff Name/Role who contacted--Reynoldo Mainer, RN  Transition Assessment  Discharge Summary obtained?--Yes  How are you recovering?--Improving  Discharge Meds obtained?--Yes  Discharge medication changes reviewed?--Yes  Full Medication Reconciliation Completed?--No  Medication understanding --knows new medication(s)--knows purpose of medication--has assistance managing medications  Medication Concerns?--Yes  Have everything needed for recovery?--Yes  Care Coordination:   Patient has transition follow-up appointment date and time?--Yes  Follow up appointment date:--10/17/2023  Specialist Transition Visit planned?--Yes  Specialist Transition Visit date:--10/02/2023  Patient/caregiver plans to attend transition visit?--Yes  Primary Follow-up Barrier  Interventions provided --reinforced discharge instructions--follow-up appointment date/time reinforced--patient expresses understanding of follow-up plan  Home Health or DME ordered at discharge?--Yes  Agency has contacted patient to initiate services?  Home Health or DME Agency:--DME: glucometer  Clinician/Team notified?--No  Primary reason clinician notified?  Transition Note:Instructions/Plan/Review:     Patient requests transportation referral for follow up appointment?No  Reviewed After Visit [place] (AVS) and/or discharge summary with patient? Yes   Educated patient to contact PCP for non-acute symptoms during office business hours.  PCP number provided/reinforced. Yes   Nurse Navigator toll free number, resources available, and 24/7 hours of operation provided to patient and/or caregiver. Yes    Referrals to population health? No  Additional information/assessment:    [name] states that he is doing okay, but he is overwhelmed with multiple health issues concerns. AVS reviewed. Rx's have been obtained with the exception of B6 and MVI, which I advised him that these are OTC. Patient has glucometer. He is recording results. Blood sugars 132 this morning, and 116 within the last few minutes. Patient has multiple follow up appointments. Encouraged patient to keep follow up appointments. Return to ED precautions given. 24/7 Nurse Navigator toll free number, resources available, and hours of operation provided to patient and/or caregiver. At the end of the call patient voiced concerns about how he was treated by a staff member. I apologized for his experience, and recommended that he speak with the patient advocate at [facility] phone number provided.       Joann Mu, RN    [image]         Joann Mu, RN

## 2023-10-12 ENCOUNTER — Encounter (HOSPITAL_COMMUNITY): Payer: Self-pay

## 2023-10-15 LAB — ISLET CELL ANTIBODY SCREEN W/REFLEX TO TITER: ISLET CELL ANTIBODY SCREEN: NEGATIVE

## 2023-10-17 LAB — ZINC TRANSPORTER 8 (ZNT8) ANTIBODY: ZNT8 AB: <10 U/mL (ref <15)

## 2023-10-31 NOTE — Progress Notes (Addendum)
 [name]        TELEMEDICINE DOCUMENTATION:  Patient Location:  [facility], [address], [address], [place], [place] [address]  Patient/family aware of provider location:  Yes  Patient/family consent for telemedicine:  Yes  Examination observed and performed by:  Wardell Guys, APRN,FNP-C    Chief Complaint:  No chief complaint on file.    Referring Provider: Quin Brush, DO  PCP: Quin Brush, DO    HPI:   [name] is a 48 y.o. male with PMH of pre-diabetes, HTN, HLD, COPD, Crohn's, ulcerative colitis, Barrett's esophagus, liver cirrhosis, and alcohol use who presents as a referral for evaluation of type *** diabetes mellitus.    -Diagnosed: 09/2023  -History of DKA: ***  -Most recent HgA1C:   Lab Results   Component Value Date    HA1C 7.3 (H) 10/05/2023      -Current Diabetes Medications: ***    -Past Diabetes Medications: ***      I uploaded, reviewed, interpreted, and made recommendations based on patient's CGM and pump data.  -In range ***%, high ***%, very high ***%, low ***%, very low ***%    -Checks sugars *** times a day:   --Morning: ***  --Before Lunch: ***  --Before Dinner: ***  --Bedtime: ***    HYPOGLYCEMIA EVALUATION:  Is it occurring? Symptoms?    Hypoglycemic Event: (Date/Time)    Treatment:    Assisted or Unassisted:    Hypoglycemic unawareness?    -Carries glucose tablets/candy/snacks: ***  -Glucagon  kit: ***    DIABETIC COMPLICATIONS:  Microvascular Complications:   Peripheral/Autonomic Neuropathy: ***; Wounds? ***  Retinopathy: Last eye exam ***  Renal:  Most recent GFR:   Lab Results   Component Value Date    GFR 126 10/09/2023     Most recent microalbumin/Cr ratio:   Urine Microalb/cr ratio   No results found for: "MICALBCRERAT"     On ACEI/ARB: ***    Macrovascular Complications:  Hx of CAD: ***  Hx of statin/ASA ***; On SGLT2 or GLP-1 therapy? ***  Hx of Stroke: ***  Hx of HTN: ***  Hx of HLD: ***    -History of pancreatitis:  ***  -Frequent/recurrent UTI/yeast infections: ***  -Personal or family history of thyroid  cancer: ***    Past Medical History:   Diagnosis Date    Asthma     Bleeding hemorrhoid     Bleeding ulcer     Cervical herniated disc     COPD (chronic obstructive pulmonary disease)     GERD (gastroesophageal reflux disease)     Hx MRSA infection     Hyperlipidemia     Hypertension     Rotator cuff arthropathy, right     Type 2 diabetes mellitus     Ulcerative colitis          Past Surgical History:   Procedure Laterality Date    HX BACK SURGERY      L7 L8 repair    HX CYST REMOVAL      lower back    HX HERNIA REPAIR      HX TONSILLECTOMY           Social History     Tobacco Use    Smoking status: Every Day     Current packs/day: 1.00     Average packs/day: 1 pack/day for 30.0 years (30.0 ttl pk-yrs)     Types: Cigarettes     Passive exposure: Current    Smokeless  tobacco: Never   Vaping Use    Vaping status: Never Used   Substance Use Topics    Alcohol use: Yes     Alcohol/week: 35.0 standard drinks of alcohol     Types: 35 Cans of beer per week    Drug use: Never      Family Medical History:       Problem Relation (Age of Onset)    Anesthesia Complications Father    Arthritis-osteo Mother, Father    Asthma Mother, Father, Brother, Paternal Grandmother, Paternal Grandfather    Blood Clots Father, Paternal Grandmother, Paternal Grandfather    Cancer Mother, Father    Congestive Heart Failure Father, Paternal Grandmother, Paternal Grandfather    Coronary Artery Disease Father, Paternal Grandmother, Paternal Grandfather    Diabetes Mother, Father    Heart Attack Father, Paternal Grandmother, Paternal Grandfather    High Cholesterol Mother, Father    Hypertension (High Blood Pressure) Mother, Father    Stroke Father, Maternal Grandmother, Maternal Grandfather, Paternal Grandmother, Paternal Grandfather            Current Outpatient Medications   Medication Sig    albuterol  sulfate (PROVENTIL  OR VENTOLIN  OR PROAIR ) 90  mcg/actuation Inhalation oral inhaler Take 1-2 Puffs by inhalation Every 6 hours as needed    budesonide  (ENTOCORT EC ) 3 mg Oral Capsule, Delayed & Ext.Release Take 3 Capsules (9 mg total) by mouth    budesonide -formoteroL  (SYMBICORT ) 160-4.5 mcg/actuation Inhalation oral inhaler Take 2 Puffs by inhalation Twice daily    carvediloL  (COREG ) 3.125 mg Oral Tablet Take 1 Tablet (3.125 mg total) by mouth Twice daily with food for 30 days    cholestyramine -sucrose (QUESTRAN ) 4 gram Oral Powder in Packet Take 1 Packet by mouth Daily    ciprofloxacin -dexAMETHasone  (CIPRODEX ) 0.3-0.1 % Otic Drops, Suspension instill 4 drops into affected ear(s) by otic route 2 times per day for 7 days    dicyclomine  (BENTYL ) 20 mg Oral Tablet Take 1 Tablet (20 mg total) by mouth Four times a day for 30 days    famotidine  (PEPCID ) 40 mg Oral Tablet Take 1 Tablet (40 mg total) by mouth    folic acid  (FOLVITE ) 1 mg Oral Tablet Take 1 Tablet (1 mg total) by mouth Daily for 30 days    insulin  glargine (LANTUS  SOLOSTAR U-100 INSULIN ) 100 unit/mL Subcutaneous Insulin  Pen Inject 10 Units under the skin Every night for 30 days    Mesalamine  (APRISO ) 0.375 gram Oral Capsule, Sust. Release 24 hr Take 4 Capsules (1.5 g total) by mouth Daily    multivitamin with folic acid  (THERA) 400 mcg Oral Tablet Take 1 Tablet by mouth Daily for 30 days    omeprazole (PRILOSEC) 40 mg Oral Capsule, Delayed Release(E.C.) Take 1 Capsule (40 mg total) by mouth Daily    ondansetron  (ZOFRAN  ODT) 4 mg Oral Tablet, Rapid Dissolve Take 1 Tablet (4 mg total) by mouth Every 8 hours as needed for Nausea/Vomiting    pyridoxine , vitamin B6, (VITAMIN B6) 100 mg Oral Tablet Take 1 Tablet (100 mg total) by mouth Daily for 30 days    spironolactone  (ALDACTONE ) 50 mg Oral Tablet Take 1 Tablet (50 mg total) by mouth Daily    tamsulosin  (FLOMAX ) 0.4 mg Oral Capsule Take 1 Capsule (0.4 mg total) by mouth Every evening after dinner for 30 days    thiamine  mononitrate 100 mg Oral Tablet  Take 1 Tablet (100 mg total) by mouth Daily for 30 days     Allergies   Allergen Reactions  Noctec [Chloral Hydrate] Anaphylaxis    Penicillins Anaphylaxis    Theophylline Shortness of Breath    Flexeril [Cyclobenzaprine] Mental Status Effect     Homicidal ideation    Prednisone  Myalgia    Steroids [Steroid] NO Steroids unless approved by Attending Physician       PHYSICAL EXAM:   There were no vitals taken for this visit.        Physical Exam  Diabetic foot exam:  {EXAM[facility] DIABETIC FOOT:[id number]}          LABS:  Lab Results   Component Value Date/Time    HA1C 7.3 (H) 10/05/2023 06:07 PM    SODIUM 137 10/09/2023 05:48 AM    POTASSIUM 3.9 10/09/2023 05:48 AM    CHLORIDE 101 10/09/2023 05:48 AM    CO2 28 10/09/2023 05:48 AM    BUN 3 (L) 10/09/2023 05:48 AM    CREATININE 0.53 (L) 10/09/2023 05:48 AM    GLUCOSE 500 (A) 10/05/2023 07:05 PM    AST 106 (H) 10/09/2023 05:48 AM    ALT 60 (H) 10/09/2023 05:48 AM         Assessment and Plan:  Type *** Diabetes Mellitus complicated by ***  -Most recent HgA1C ***% in ***/20***, will repeat in *** months. Urine microalbumin/creatinine ratio ***, will repeat yearly. On ACEi/ARB: ***  -Most recent annual diabetes foot exam ***. Following with Podiatry***  -Most recent annual diabetic eye exam ***.   -Reviewed blood sugar logs. AM sugars ***. Mealtime sugars running in *** to *** range.  -Hypoglycemia ***occurring/not currently occurring***. Has glucagon  kit/glucose tablets***  -Continue ***  -Has test strips, lancets, glucometer, pen needles (or syringes)***  -Continue *** for peripheral neuropathy  -Advised patient to continue checking sugars 3-4x/day and to bring sugar logs or glucometer to clinic appointments  -I spoke today with {DM Educators:[id number]}, our DM educator, regarding patient.    Return to diabetic education   HgA1c Goal <7%  Yearly eye and daily foot exams discussed  Symptoms of potential hypoglycemia discussed and treatment of hypoglycemia discussed  Return visit  in *** months or earlier if needed     Labs today:   No orders of the defined types were placed in this encounter.      The patient was seen as part of a collaborative telemedicine service with *** who participated in the encounter by active presence via approved video/audio means for portions of the encounter.  *** and Wardell Guys, FNP-C discussed the physical assessment, reviewed and interpreted all studies and the plan was mutually developed.      Wardell Guys, FNP-C  Advanced Practice Provider  [facility]

## 2023-11-01 ENCOUNTER — Ambulatory Visit (INDEPENDENT_AMBULATORY_CARE_PROVIDER_SITE_OTHER): Payer: Self-pay | Admitting: NURSE PRACTITIONER

## 2023-11-01 ENCOUNTER — Other Ambulatory Visit: Payer: Self-pay

## 2023-11-01 ENCOUNTER — Encounter (INDEPENDENT_AMBULATORY_CARE_PROVIDER_SITE_OTHER): Payer: Self-pay | Admitting: NURSE PRACTITIONER

## 2023-11-01 VITALS — BP 151/90 | HR 95 | Temp 98.4°F | Resp 18 | Ht 66.0 in | Wt 181.6 lb

## 2023-11-01 DIAGNOSIS — E119 Type 2 diabetes mellitus without complications: Secondary | ICD-10-CM

## 2023-11-01 DIAGNOSIS — E1165 Type 2 diabetes mellitus with hyperglycemia: Secondary | ICD-10-CM

## 2023-11-01 DIAGNOSIS — Z794 Long term (current) use of insulin: Secondary | ICD-10-CM

## 2023-11-01 DIAGNOSIS — I10 Essential (primary) hypertension: Secondary | ICD-10-CM

## 2023-11-01 MED ORDER — TRUE METRIX GLUCOSE TEST STRIP
ORAL_STRIP | 3 refills | Status: DC
Start: 2023-11-01 — End: 2023-11-22

## 2023-11-01 MED ORDER — TRUEPLUS LANCETS 28 GAUGE
3 refills | Status: DC
Start: 2023-11-01 — End: 2023-11-22

## 2023-11-01 NOTE — Nursing Note (Signed)
 11/01/23 1100   Urine Test   Performed Status: Automated   Time collected 1112   Manufacturer Siemens   Urine Test - Siemens   Color (Ref Range: Yellow) (!) Dark Yellow   Clarity (Ref Range: Clear) (!) Cloudy   Glucose (Ref Range: Negative mg/dL) Negative   Bilirubin (Ref Range: Negative mg/dL) Negative   Ketones (Ref Range: Negative mg/dL) Negative   Urine Specific Gravity (Ref Range: 1.005 - 1.030) 1.025   Blood (urine) (Ref Range: Negative mg/dL) Negative   pH (Ref Range: 5.0 - 8.0) 6.0   Protein (Ref Range: Negative mg/dL) (!) 1+ (30mg /dL)   Urobilinogen (Ref Range: Normal) 0.2mg /dL (Normal)   Nitrite (Ref Range: Negative) Negative   Leukocytes (Ref Range: Negative WBC's/uL) Negative   Initials LAD

## 2023-11-01 NOTE — Nursing Note (Signed)
 11/01/23 1100   Urine Test   Performed Status: Automated   Time collected 1112   Manufacturer Siemens   Urine Test - Siemens   Color (Ref Range: Yellow) (!) Dark Yellow   Clarity (Ref Range: Clear) (!) Cloudy   Glucose (Ref Range: Negative mg/dL) Negative   Bilirubin (Ref Range: Negative mg/dL) Negative   Ketones (Ref Range: Negative mg/dL) Negative   Urine Specific Gravity (Ref Range: 1.005 - 1.030) 1.025   Blood (urine) (Ref Range: Negative mg/dL) Negative   pH (Ref Range: 5.0 - 8.0) 6.0   Protein (Ref Range: Negative mg/dL) (!) 1+ (30mg /dL)   Urobilinogen (Ref Range: Normal) 0.2mg /dL (Normal)   Nitrite (Ref Range: Negative) Negative   Leukocytes (Ref Range: Negative WBC's/uL) Negative   Initials LAD   Urine Albumin/Creatinine Ratio   Time Performed 1112   Albumin/Creatinine Ratio 30-300   Microalbumin results: 80   Creatinine results: 200   Performed status: Automated   Initials JDC

## 2023-11-01 NOTE — Patient Instructions (Addendum)
 Recommend a yearly exam   Follow-up appointment will be scheduled 6-9 months

## 2023-11-17 ENCOUNTER — Ambulatory Visit
Admission: RE | Admit: 2023-11-17 | Discharge: 2023-11-17 | Disposition: A | Discharge status: Home / Self Care | Source: Ambulatory Visit | Attending: Family Medicine

## 2023-11-17 ENCOUNTER — Other Ambulatory Visit: Payer: Self-pay

## 2023-11-17 ENCOUNTER — Ambulatory Visit (HOSPITAL_BASED_OUTPATIENT_CLINIC_OR_DEPARTMENT_OTHER)
Admission: RE | Admit: 2023-11-17 | Discharge: 2023-11-17 | Disposition: A | Discharge status: Home / Self Care | Source: Ambulatory Visit | Attending: Family Medicine | Admitting: Family Medicine

## 2023-11-17 DIAGNOSIS — R17 Unspecified jaundice: Secondary | ICD-10-CM | POA: Insufficient documentation

## 2023-11-17 DIAGNOSIS — R160 Hepatomegaly, not elsewhere classified: Secondary | ICD-10-CM

## 2023-11-22 ENCOUNTER — Other Ambulatory Visit (INDEPENDENT_AMBULATORY_CARE_PROVIDER_SITE_OTHER): Payer: Self-pay | Admitting: NURSE PRACTITIONER

## 2023-11-22 ENCOUNTER — Other Ambulatory Visit: Payer: Self-pay

## 2023-11-22 ENCOUNTER — Encounter (INDEPENDENT_AMBULATORY_CARE_PROVIDER_SITE_OTHER): Admitting: NURSE PRACTITIONER

## 2023-11-22 DIAGNOSIS — E119 Type 2 diabetes mellitus without complications: Secondary | ICD-10-CM

## 2023-11-22 MED ORDER — TRUEPLUS LANCETS 28 GAUGE: 3 refills | Status: DC

## 2023-11-22 MED ORDER — TRUE METRIX GLUCOSE TEST STRIP
ORAL_STRIP | 3 refills | Status: DC
Start: 2023-11-22 — End: 2024-02-19

## 2023-11-22 MED ORDER — BD ULTRA-FINE MINI PEN NEEDLE 31 GAUGE X 3/16"
3 refills | Status: AC
Start: 2023-11-22 — End: ?

## 2023-11-22 NOTE — Progress Notes (Signed)
 Continuous Glucose Monitoring (CGM) Device and/or pump data:   I uploaded, viewed the images and reviewed the data, interpreted, and made recommendations based on patient's CGM and/or pump data.      Full report to be scanned into media.     Interpretation:   Time period of downloaded data: 14 days.     Patterns identified: ***     Recommendations sent to patient via mychart:    I have reviewed at your blood sugars and have a few recommendations for you.

## 2023-11-22 NOTE — Addendum Note (Signed)
 Addended by: Dori Garbe on: 11/22/2023 09:31 AM     Modules accepted: Orders

## 2023-11-23 ENCOUNTER — Encounter (INDEPENDENT_AMBULATORY_CARE_PROVIDER_SITE_OTHER): Payer: Self-pay | Admitting: NURSE PRACTITIONER

## 2023-11-23 ENCOUNTER — Other Ambulatory Visit (INDEPENDENT_AMBULATORY_CARE_PROVIDER_SITE_OTHER): Payer: Self-pay | Admitting: NURSE PRACTITIONER

## 2023-11-23 DIAGNOSIS — E119 Type 2 diabetes mellitus without complications: Secondary | ICD-10-CM

## 2023-11-23 MED ORDER — METFORMIN 500 MG TABLET: 1000.0000 mg | ORAL_TABLET | Freq: Two times a day (BID) | ORAL | 4 refills | Status: DC

## 2023-12-05 ENCOUNTER — Ambulatory Visit (HOSPITAL_COMMUNITY): Payer: Self-pay | Admitting: SLEEP MEDICINE

## 2024-01-11 ENCOUNTER — Ambulatory Visit: Payer: Self-pay | Attending: ANESTHESIOLOGY | Admitting: ANESTHESIOLOGY

## 2024-01-11 ENCOUNTER — Other Ambulatory Visit: Payer: Self-pay

## 2024-01-11 ENCOUNTER — Encounter (INDEPENDENT_AMBULATORY_CARE_PROVIDER_SITE_OTHER): Payer: Self-pay | Admitting: ANESTHESIOLOGY

## 2024-01-11 VITALS — BP 129/86 | HR 102 | Resp 20 | Ht 66.0 in | Wt 189.6 lb

## 2024-01-11 DIAGNOSIS — M48061 Spinal stenosis, lumbar region without neurogenic claudication: Secondary | ICD-10-CM | POA: Insufficient documentation

## 2024-01-11 DIAGNOSIS — M47816 Spondylosis without myelopathy or radiculopathy, lumbar region: Secondary | ICD-10-CM | POA: Insufficient documentation

## 2024-01-11 DIAGNOSIS — M5416 Radiculopathy, lumbar region: Secondary | ICD-10-CM | POA: Insufficient documentation

## 2024-01-11 DIAGNOSIS — Z9889 Other specified postprocedural states: Secondary | ICD-10-CM | POA: Insufficient documentation

## 2024-01-11 NOTE — Nursing Note (Signed)
 New Patient Visit    George Prentice Goltz Sr.    Chief Complaint   Patient presents with    Low Back Pain         [place] Pain Rating Scale     On a scale of 0-10, during the past 24 hours, pain has interfered with you usual activity: 7     On a scale of 0-10, during the past 24 hours, pain has interfered with your sleep: 7    On a scale of 0-10, during the past 24 hours, pain has affected your mood: 5     On a scale of 0-10, during the past 24 hours, pain has contributed to your stress: 10     On a scale of 0-10, what is your overall pain Rating: 7        Vitals:    01/11/24 1013   BP: 129/86   Pulse: (!) 102   Resp: 20   SpO2: 98%   Weight: 86 kg (189 lb 9.6 oz)   Height: 1.676 m (5' 6)   PainSc:   7   PainLoc: Back       Body mass index is 30.6 kg/m.    If taking opioid medication, current prescribing provider:     Recent imaging:  No results found for this or any previous visit (from the past 824799999 hours).   No results found for this or any previous visit (from the past 824799999 hours).   No results found for this or any previous visit (from the past 824799999 hours).   No results found for this or any previous visit (from the past 824799999 hours).        Prior Pain Management:  PT: yes  Heat: yes  Ice: yes  Chiropractor: no   Bedrest: no   Injection: yes  VAS (0-10) Now: 7  VAS (0-10) Best: 7  VAS (0-10) Worst: 8    Appliances Needed:  Appliances: Cane    Other Questions:  Recurring Fevers: no  Numbness/tingling: yes  Trouble sleeping: yes  Poor/increased appetite: no  Weight loss/Gain: no  Muscle Spasm: yes  Cold/burning sensation: yes  Skin discolors/where: no  Bowel/bladder issues: yes  Urinary urgency-frequency: yes    Ronal Lunger, MA  01/11/2024, 10:15

## 2024-01-11 NOTE — H&P (Signed)
 HISTORY AND PHYSICAL    [facility]  [address]  [place] [address]  Dept: [phone]  Dept Fax: [phone]  Loc: [phone]       Patient Name: [name] [facility]         MRN:  [id number]   DOB: 04/13/1976   Date of Service:  01/11/2024     This note was created with assistance from [employer] via capture of conversational audio.  Consent was obtained from the patient prior to recording.    Assessment & Plan  [name] was seen today for low back pain.    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 Pain radiates from lower left back to most commonly the knee but intermittently to the foot, exacerbated by activity, unrelieved by Advil. Minimal relief from previous surgery.  - Order MRI of the lumbar spine. [name] would like to have the MRI at the place in [place]  that he has had prior imaging. He cannot remember the name but George send a MyChart with the name as soon as possible so that the MRI can be ordered.  - Order x-ray of the lumbar spine with dynamic views.  - Obtain records regarding care from Dr. [facility].    Bilateral hip osteoarthritis per patient report.  Awaiting specialist consultation for surgical options, he has been referred to orthopedics.    Diabetes mellitus, type 2  Recently diagnosed, with A1c of 7.4. Glucose levels around 300 mg/dL during hospitalization.    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 Symptoms began before back surgery.    Liver disease with ascites  Likely exacerbated by alcohol use. Abdominal swelling with fluid accumulation.    Alcohol use   Expressed desire to stop drinking. He does not think that he has an issue with alcohol, he uses it to control pain and denies withdrawal when he did not have alcohol for a hospital admission.  He does inquire about pain medication and we discussed referral for opioid risk evaluation and pending evaluation possible referral to high risk clinic with regular visits being necessary. He George consider.    Edema of lower extremities  Bilateral fluctuating pitting edema, severe enough to prevent wearing sandals. Possible vascular issues.  - He reports that he has been referred to cardiologist for evaluation. We discussed that vascular issues may also contribute to LE symptoms and he should follow up on this referral.    Recording duration: 41 minutes  Our impression, treatment recommendations and plan from today's visit were reviewed in detail with the patient in the office. All of the patient's questions were answered.   The patient verbalized understanding of the above plan and the patient wishes to move forward with the above noted plan.  Follow up:  Following MRI lumbar spine-telemed visit would be fine.  Byard Carranza Toni Mora, DO 01/11/2024, 12:20    CC:   Chief Complaint   Patient presents with    Low Back Pain       History of Present Illness  George Gartin Sr. is a 48 year old male with a history of back surgery and diabetes who presents with chronic back and leg pain.    He experienced a significant fall at work over a year ago, resulting in a disc injury that impinged on a nerve. Surgery was performed to alleviate the pressure, involving shaving part of the spine and disc. Despite the surgery, he continues to experience chronic pain, primarily in the lower left back, radiating down the left hip and into the front  of the thigh, occasionally extending below the knee to the foot. The pain is described as 'shocking, gnawing, stabbing' and occurs daily, exacerbated by physical activity such as walking or standing, with no relief from Advil liquid gels.    He has a history of herniated discs above the surgical site, extending to the neck, with one disc protruding into the spine. He also reports a torn ACL and bilateral hip issues, with the left hip being fractured.    Neuropathy symptoms developed prior to the back surgery, with significant pain and numbness, approximately fifty percent sensation in his hands, and intermittent numbness in his feet and legs. He was diagnosed with diabetes a few months ago, with an A1c of 7.4 and glucose levels around 300 mg/dL, which may contribute to his neuropathy.    He has been on various medications, including duloxetine (Cymbalta), which he has since stopped. He mentions being on numerous medications but did not provide a complete list. He also reports a history of being on incorrect medications for colitis, which worsened his condition until corrected.    Significant swelling in his legs and abdomen is noted, with fluid retention. He reports using alcohol to manage pain when Advil is ineffective. His legs swell significantly at times, sometimes to the point where he cannot wear sandals, and he anticipates needing to resume diuretics.    He has not had recent imaging of his back since the surgery, which was performed at [facility]. He recalls having an EMG nerve conduction study months before the surgery, which evaluated his hands and arms. He has not had any recent physical therapy, though he continues some exercises at home, finding minimal relief from these activities.  Nursing Notes:   [name], [place]  01/11/24 1015  Signed  New Patient Visit    George Prentice Goltz Sr.    Chief Complaint   Patient presents with    Low Back Pain         [place] Pain Rating Scale     On a scale of 0-10,  during the past 24 hours, pain has interfered with you usual activity: 7     On a scale of 0-10, during the past 24 hours, pain has interfered with your sleep: 7    On a scale of 0-10, during the past 24 hours, pain has affected your mood: 5     On a scale of 0-10, during the past 24 hours, pain has contributed to your stress: 10     On a scale of 0-10, what is your overall pain Rating: 7        Vitals:    01/11/24 1013   BP: 129/86   Pulse: (!) 102   Resp: 20   SpO2: 98%  Weight: 86 kg (189 lb 9.6 oz)   Height: 1.676 m (5' 6)   PainSc:   7   PainLoc: Back       Body mass index is 30.6 kg/m.    If taking opioid medication, current prescribing provider:     Recent imaging:  No results found for this or any previous visit (from the past 824799999 hours).   No results found for this or any previous visit (from the past 824799999 hours).   No results found for this or any previous visit (from the past 824799999 hours).   No results found for this or any previous visit (from the past 824799999 hours).        Prior Pain Management:  PT: yes  Heat: yes  Ice: yes  Chiropractor: no   Bedrest: no   Injection: yes  VAS (0-10) Now: 7  VAS (0-10) Best: 7  VAS (0-10) Worst: 8    Appliances Needed:  Appliances: Cane    Other Questions:  Recurring Fevers: no  Numbness/tingling: yes  Trouble sleeping: yes  Poor/increased appetite: no  Weight loss/Gain: no  Muscle Spasm: yes  Cold/burning sensation: yes  Skin discolors/where: no  Bowel/bladder issues: yes  Urinary urgency-frequency: yes    Ronal Lunger, MA  01/11/2024, 10:15      Past Medical History:   Diagnosis Date    Asthma     Bleeding hemorrhoid     Bleeding ulcer     Cervical herniated disc     COPD (chronic obstructive pulmonary disease)     GERD (gastroesophageal reflux disease)     Hx MRSA infection     Hyperlipidemia     Hypertension     Low back pain     Rotator cuff arthropathy, right     Type 2 diabetes mellitus     Ulcerative colitis          Past Surgical  History:   Procedure Laterality Date    HX BACK SURGERY      L7 L8 repair    HX CYST REMOVAL      lower back    HX HERNIA REPAIR      HX TONSILLECTOMY           Cardiology-related family history includes Congestive Heart Failure in his father, paternal grandfather, and paternal grandmother; Heart Attack in his father, paternal grandfather, and paternal grandmother.  Social History[1]  Outpatient Encounter Medications as of 01/11/2024   Medication Sig Dispense Refill    albuterol  sulfate (PROVENTIL  OR VENTOLIN  OR PROAIR ) 90 mcg/actuation Inhalation oral inhaler Take 1-2 Puffs by inhalation Every 6 hours as needed      BD UF MINI PEN NEEDLE 31 gauge x 3/16 Needle Use as directed 300 Each 3    budesonide  (ENTOCORT EC ) 3 mg Oral Capsule, Delayed & Ext.Release Take 3 Capsules (9 mg total) by mouth      budesonide -formoteroL  (SYMBICORT ) 160-4.5 mcg/actuation Inhalation oral inhaler Take 2 Puffs by inhalation Twice daily      carvediloL  (COREG ) 3.125 mg Oral Tablet Take 1 Tablet (3.125 mg total) by mouth Twice daily with food for 30 days 60 Tablet 0    cholestyramine -sucrose (QUESTRAN ) 4 gram Oral Powder in Packet Take 1 Packet by mouth Daily      ciprofloxacin -dexAMETHasone  (CIPRODEX ) 0.3-0.1 % Otic Drops, Suspension Instill 4 Drops into both ears Once per day as needed for Other      doxycycline hyclate (VIBRAMYCIN) 100 mg Oral Capsule Take 1 Capsule (  100 mg total) by mouth Twice daily (Patient not taking: Reported on 01/11/2024)      DULoxetine (CYMBALTA DR) 30 mg Oral Capsule, Delayed Release(E.C.) Take 1 Capsule (30 mg total) by mouth Daily      insulin  glargine (LANTUS  SOLOSTAR U-100 INSULIN ) 100 unit/mL Subcutaneous Insulin  Pen Inject 10 Units under the skin Every night for 30 days 3 mL 0    Mesalamine  (APRISO ) 0.375 gram Oral Capsule, Sust. Release 24 hr Take 4 Capsules (1.5 g total) by mouth Daily      metFORMIN  (GLUCOPHAGE ) 500 mg Oral Tablet Take 2 Tablets (1,000 mg total) by mouth Twice daily with food 360  Tablet 4    omeprazole (PRILOSEC) 40 mg Oral Capsule, Delayed Release(E.C.) Take 1 Capsule (40 mg total) by mouth Daily      ondansetron  (ZOFRAN  ODT) 4 mg Oral Tablet, Rapid Dissolve Take 1 Tablet (4 mg total) by mouth Every 8 hours as needed for Nausea/Vomiting (Patient not taking: Reported on 01/11/2024) 12 Tablet 0    spironolactone  (ALDACTONE ) 50 mg Oral Tablet Take 1 Tablet (50 mg total) by mouth Daily      tamsulosin  (FLOMAX ) 0.4 mg Oral Capsule Take 1 Capsule (0.4 mg total) by mouth Every evening after dinner for 30 days 30 Capsule 0    TRUE METRIX GLUCOSE METER Does not apply Misc       TRUE METRIX GLUCOSE TEST STRIP Does not apply Strip Check blood glucose 2-3 times a day 200 Each 3    TRUEPLUS LANCETS 28 gauge Misc Check glucose level 2-3 times a day 200 Each 3     No facility-administered encounter medications on file as of 01/11/2024.     Allergies[2]   REVIEW OF SYSTEMS:   12 systems were reviewed and are positive as follows:+ as noted above. All other systems negative.     Vitals:    01/11/24 1013   BP: 129/86   Pulse: (!) 102   Resp: 20   SpO2: 98%   Weight: 86 kg (189 lb 9.6 oz)   Height: 1.676 m (5' 6)   PainSc:   7   PainLoc: Back     Body mass index is 30.6 kg/m.  General: 48 y.o. year old male resting in a seated position in NAD.  Demonstrates no pain behavior, symptom magnification or drug-seeking behavior.  Speech is fluent and affect is normal.  HEENT: The patient is normocephalic and atraumatic. EOMI.  Cardiovascular: Bilateral radial pulses are intact and regular.  Pulmonary: No increased work of breathing and no accessory muscle use noted.  Abdomen: distended.  Lymphatic System: No adenopathy identified.                                                                               Skin: Warm, dry.                                      Psych: Affect normal. A&Ox3.  Neurological/Musculoskeletal: Visual Inspection: No appreciable muscular atrophy  Motor examination:   Leg Right Left   Hip  flexion (L2) 5/5 5/5   Knee extension (  L3) 5/5 5/5   Foot Dorsi Flexion (L4) 5/5 5/5   Toe extension (L5) 5/5 5/5   Foot plantar flexion (S1) 5/5 5/5   Reflexes:    Patella Achilles   Right 2+ 2+   Left 2+ 2+     Sensation: All major dermatomes in the bilateral lower extremities intact to light touch,  lumbar facet loading Positive right and Positive left  Straight leg raise: Negative on the right / Negative on the left  Gait:Nonantalgic with use of no assistive device  Station: Neutral  Physical Exam  EXTREMITIES: Pitting edema in bilateral legs, 1+ right and 2+ left. No erythema or increased temperature appreciated.  MUSCULOSKELETAL: Strength 5/5 in bilateral legs.    DATA REVIEWED:  Results  LABS  A1c: 7.4%, Glucose: 300 mg/dL per patient report      [place] Pain Rating Scale     On a scale of 0-10, during the past 24 hours, pain has interfered with you usual activity: 7     On a scale of 0-10, during the past 24 hours, pain has interfered with your sleep: 7    On a scale of 0-10, during the past 24 hours, pain has affected your mood: 5     On a scale of 0-10, during the past 24 hours, pain has contributed to your stress: 10     On a scale of 0-10, what is your overall pain Rating: 7      RADIOLOGY    Narrative & Impression   SR. Marcques ANDREW Mackley SR.     RADIOLOGIST: Camellia Elsie Coder, MD     CT ABDOMEN WO IV CONTRAST performed on 11/17/2023 9:52 AM     CLINICAL HISTORY: R17: Unspecified jaundice  UNSPECIFIED JAUNDICE, abdominal distention,  hx of bowel obstruction, chron's disease, bleeding ulcers, surgical hx of repair of umbilical hernia, back surgery, patient did drink oral contrast     TECHNIQUE:  Abdomen CT without intravenous contrast. No oral contrast.     COMPARISON:  10/05/2023     FINDINGS:  Noncontrast technique limits evaluation of the abdominal viscera.     Lung bases: There is some atelectasis or scarring at the left lung base.     Liver:   There is persistent marked hepatomegaly with little change  from the exam approximately 6 weeks earlier. There is low-density throughout the liver suggesting fatty infiltration.     Gallbladder:   Mildly contracted but otherwise unremarkable. There is no biliary dilation.     Spleen:   Unremarkable.     Pancreas:   Unremarkable.     Adrenals:   Unremarkable.     Kidneys:   There is a complex cyst in the medial aspect of the right kidney at the midpole measuring 12 mm. There is some high density posteriorly which may represent some layering milk of calcium. This is considered benign.     Bowel:   Visualized loops of bowel in the upper abdomen are unremarkable.     Lymph nodes:  No suspicious lymph node enlargement.  Vasculature:   There is calcified atherosclerosis involving aorta without aneurysm. This is significantly advanced for patient age.  Peritoneum / Retroperitoneum: No ascites or free air at the upper abdomen.     Bones:   There is mild degenerative change of the spine. There is bony fusion of the included upper aspect of the left sacroiliac joint.     There is mesh deep to the umbilicus consistent with prior  surgery.     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 THERE IS NO EVIDENCE OF BILIARY DILATION ON THIS EXAM.        Likely L3-L4 level from CT Abdomen        Likely L4-L5 level from CT Abdomen        Likely L4-L5 level from CT Abdomen    Mild to moderate CCS noted at likely L4-L5 (spine not completely visualized on CT Abdomen) with left lateral recess stenosis and left foraminal stenosis.    On the day of the encounter, a total of  67 minutes was spent on this patient encounter including review of historical information, examination, documentation and post-visit activities.       Kaeson Kleinert Toni Cecilia, DO 01/11/2024, 10:26          [1]   Social History  Tobacco Use    Smoking status: Every Day     Current packs/day: 1.00     Average packs/day: 1 pack/day for 30.0 years (30.0 ttl pk-yrs)     Types: Cigarettes      Passive exposure: Current    Smokeless tobacco: Never   Vaping Use    Vaping status: Never Used   Substance Use Topics    Alcohol use: Yes     Alcohol/week: 35.0 standard drinks of alcohol     Types: 35 Cans of beer per week    Drug use: Never   [2]   Allergies  Allergen Reactions    Noctec [Chloral Hydrate] Anaphylaxis    Penicillins Anaphylaxis    Theophylline Shortness of Breath    Flexeril [Cyclobenzaprine] Mental Status Effect     Homicidal ideation    Prednisone  Myalgia    Steroids [Steroid] NO Steroids unless approved by Attending Physician

## 2024-02-19 ENCOUNTER — Other Ambulatory Visit (INDEPENDENT_AMBULATORY_CARE_PROVIDER_SITE_OTHER): Payer: Self-pay | Admitting: NURSE PRACTITIONER

## 2024-02-19 DIAGNOSIS — E119 Type 2 diabetes mellitus without complications: Secondary | ICD-10-CM

## 2024-02-19 MED ORDER — TRUE METRIX GLUCOSE TEST STRIP: ORAL_STRIP | 3 refills | Status: DC

## 2024-02-20 ENCOUNTER — Other Ambulatory Visit (INDEPENDENT_AMBULATORY_CARE_PROVIDER_SITE_OTHER): Payer: Self-pay | Admitting: NURSE PRACTITIONER

## 2024-02-20 DIAGNOSIS — E119 Type 2 diabetes mellitus without complications: Secondary | ICD-10-CM

## 2024-02-20 MED ORDER — DEXCOM G7 SENSOR DEVICE: 2 refills | Status: DC

## 2024-04-03 ENCOUNTER — Other Ambulatory Visit (INDEPENDENT_AMBULATORY_CARE_PROVIDER_SITE_OTHER): Payer: Self-pay | Admitting: NURSE PRACTITIONER

## 2024-04-03 DIAGNOSIS — E119 Type 2 diabetes mellitus without complications: Secondary | ICD-10-CM

## 2024-04-03 MED ORDER — LANTUS SOLOSTAR U-100 INSULIN 100 UNIT/ML (3 ML) SUBCUTANEOUS PEN: 10.0000 [IU] | PEN_INJECTOR | Freq: Every evening | SUBCUTANEOUS | 0 refills | Status: DC

## 2024-06-05 NOTE — Progress Notes (Unsigned)
 [place] [facility]        TELEMEDICINE DOCUMENTATION:  Patient Location:  [facility], [address], [address], [place], [place] [address]  Patient/family aware of provider location:  Yes  Patient/family consent for telemedicine:  Yes  Examination observed and performed by:  Alan GEANNIE Shropshire, APRN,FNP-C    Chief Complaint:    No chief complaint on file.    Referring Provider: Benedict Hasten, DO  PCP: Benedict Hasten, DO    HPI:   George Hurrell Polich Sr. is a 48 y.o. male with PMH of pre-diabetes, HTN, HLD, COPD, Crohn's, ulcerative colitis, Barrett's esophagus, and alcohol use who presents as a referral for evaluation of type 2 diabetes mellitus.    History of Present Illness      10/2023: He was recently admitted to [facility], for hypokalemia, COPD, HTN and lactic acidosis, with new onset diabetes. Endocrinology was consulted and provided in/out patient diabetes management recommendations. For discharge, advised to take Lantus  10 units qday.     He has been checking his glucose levels, routinely 1 hour after a meal. Did check fasting glucose level this am, and was 137 (did not bring his meter).     Was seen by his PCP recently with labs completed.     -Diagnosed: 09/2023  -History of DKA: No  -Most recent HgA1C:   Lab Results   Component Value Date    HA1C 7.3 (H) 10/05/2023      -Current Diabetes Medications:   Lantus  10 units daily    -Past Diabetes Medications:   Metformin  (held for kidney function)    -Checks sugars 3 times a day:   Low 100's- mid 200s    HYPOGLYCEMIA EVALUATION:  Is it occurring? Symptoms? No   Hypoglycemic Event: (Date/Time)    Treatment:    Assisted or Unassisted:    Hypoglycemic unawareness?    -Carries glucose tablets/candy/snacks: No  -Glucagon  kit: No    DIABETIC COMPLICATIONS:  Microvascular Complications:   Peripheral/Autonomic Neuropathy: No; Wounds? No  Retinopathy: Last eye exam- Not recent  Renal:  Most recent GFR:   Lab Results   Component Value  Date    GFR 126 10/09/2023     Most recent microalbumin/Cr ratio:   Urine Microalb/cr ratio   No results found for: MICALBCRERAT     On ACEI/ARB: No    Macrovascular Complications:  Hx of CAD: No  Hx of statin/ASA No; On SGLT2 or GLP-1 therapy? No  Hx of Stroke: No  Hx of HTN: Yes  Hx of HLD: Yes    -History of pancreatitis: No  -Frequent/recurrent UTI/yeast infections: No  -Personal or family history of thyroid  cancer: No    Past Medical History:   Diagnosis Date    Asthma     Bleeding hemorrhoid     Bleeding ulcer     Cervical herniated disc     COPD (chronic obstructive pulmonary disease)     GERD (gastroesophageal reflux disease)     Hx MRSA infection     Hyperlipidemia     Hypertension     Low back pain     Rotator cuff arthropathy, right     Type 2 diabetes mellitus     Ulcerative colitis      Past Surgical History:   Procedure Laterality Date    HX BACK SURGERY      L7 L8 repair    HX CYST REMOVAL      lower back    HX HERNIA  REPAIR      HX TONSILLECTOMY       Social History     Tobacco Use    Smoking status: Every Day     Current packs/day: 1.00     Average packs/day: 1 pack/day for 30.0 years (30.0 ttl pk-yrs)     Types: Cigarettes     Passive exposure: Current    Smokeless tobacco: Never   Vaping Use    Vaping status: Never Used   Substance Use Topics    Alcohol use: Yes     Alcohol/week: 35.0 standard drinks of alcohol     Types: 35 Cans of beer per week    Drug use: Never      Family Medical History:       Problem Relation (Age of Onset)    Anesthesia Complications Father    Arthritis-osteo Mother, Father    Asthma Mother, Father, Brother, Paternal Grandmother, Paternal Grandfather    Blood Clots Father, Paternal Grandmother, Paternal Grandfather    Cancer Mother, Father    Congestive Heart Failure Father, Paternal Grandmother, Paternal Grandfather    Coronary Artery Disease Father, Paternal Grandmother, Paternal Grandfather    Diabetes Mother, Father    Heart Attack Father, Paternal Grandmother,  Paternal Grandfather    High Cholesterol Mother, Father    Hypertension (High Blood Pressure) Mother, Father    Stroke Father, Maternal Grandmother, Maternal Grandfather, Paternal Grandmother, Paternal Grandfather          Current Outpatient Medications   Medication Sig    albuterol  sulfate (PROVENTIL  OR VENTOLIN  OR PROAIR ) 90 mcg/actuation Inhalation oral inhaler Take 1-2 Puffs by inhalation Every 6 hours as needed    BD UF MINI PEN NEEDLE 31 gauge x 3/16 Needle Use as directed    Blood-Glucose Sensor (DEXCOM G7 SENSOR) Does not apply Device Apply a new sensor every 10 days    budesonide  (ENTOCORT EC ) 3 mg Oral Capsule, Delayed & Ext.Release Take 3 Capsules (9 mg total) by mouth    budesonide -formoteroL  (SYMBICORT ) 160-4.5 mcg/actuation Inhalation oral inhaler Take 2 Puffs by inhalation Twice daily    carvediloL  (COREG ) 3.125 mg Oral Tablet Take 1 Tablet (3.125 mg total) by mouth Twice daily with food for 30 days    cholestyramine -sucrose (QUESTRAN ) 4 gram Oral Powder in Packet Take 1 Packet by mouth Daily    ciprofloxacin -dexAMETHasone  (CIPRODEX ) 0.3-0.1 % Otic Drops, Suspension Instill 4 Drops into both ears Once per day as needed for Other    doxycycline hyclate (VIBRAMYCIN) 100 mg Oral Capsule Take 1 Capsule (100 mg total) by mouth Twice daily (Patient not taking: Reported on 01/11/2024)    DULoxetine (CYMBALTA DR) 30 mg Oral Capsule, Delayed Release(E.C.) Take 1 Capsule (30 mg total) by mouth Daily    insulin  glargine (LANTUS  SOLOSTAR U-100 INSULIN ) 100 unit/mL Subcutaneous Insulin  Pen Inject 10 Units under the skin Every night    Mesalamine  (APRISO ) 0.375 gram Oral Capsule, Sust. Release 24 hr Take 4 Capsules (1.5 g total) by mouth Daily    metFORMIN  (GLUCOPHAGE ) 500 mg Oral Tablet Take 2 Tablets (1,000 mg total) by mouth Twice daily with food    omeprazole (PRILOSEC) 40 mg Oral Capsule, Delayed Release(E.C.) Take 1 Capsule (40 mg total) by mouth Daily    ondansetron  (ZOFRAN  ODT) 4 mg Oral Tablet, Rapid  Dissolve Take 1 Tablet (4 mg total) by mouth Every 8 hours as needed for Nausea/Vomiting (Patient not taking: Reported on 01/11/2024)    spironolactone  (ALDACTONE ) 50 mg Oral Tablet Take 1 Tablet (  50 mg total) by mouth Daily    tamsulosin  (FLOMAX ) 0.4 mg Oral Capsule Take 1 Capsule (0.4 mg total) by mouth Every evening after dinner for 30 days    TRUE METRIX GLUCOSE METER Does not apply Misc     TRUE METRIX GLUCOSE TEST STRIP Does not apply Strip Check blood glucose 3 times a day    TRUEPLUS LANCETS 28 gauge Misc Check glucose level 2-3 times a day     Allergies   Allergen Reactions    Noctec [Chloral Hydrate] Anaphylaxis    Penicillins Anaphylaxis    Theophylline Shortness of Breath    Flexeril [Cyclobenzaprine] Mental Status Effect     Homicidal ideation    Prednisone  Myalgia    Steroids [Steroid] NO Steroids unless approved by Attending Physician     PHYSICAL EXAM:   There were no vitals taken for this visit.    Physical Exam  LABS:    11/01/23 1100    Urine Test   Performed Status: Automated   Time collected 1112   Manufacturer Siemens   Urine Test - Siemens   Color (Ref Range: Yellow) (!) Dark Yellow   Clarity (Ref Range: Clear) (!) Cloudy   Glucose (Ref Range: Negative mg/dL) Negative   Bilirubin (Ref Range: Negative mg/dL) Negative   Ketones (Ref Range: Negative mg/dL) Negative   Urine Specific Gravity (Ref Range: 1.005 - 1.030) 1.025   Blood (urine) (Ref Range: Negative mg/dL) Negative   pH (Ref Range: 5.0 - 8.0) 6.0   Protein (Ref Range: Negative mg/dL) (!) 1+ (30mg /dL)   Urobilinogen (Ref Range: Normal) 0.2mg /dL (Normal)   Nitrite (Ref Range: Negative) Negative   Leukocytes (Ref Range: Negative WBC's/uL) Negative   Initials LAD   Urine Albumin/Creatinine Ratio   Time Performed 1112   Albumin/Creatinine Ratio 30-300   Microalbumin results: 80   Creatinine results: 200   Performed status: Automated   Initials JDC            Lab Results   Component Value Date/Time    HA1C 7.3 (H) 10/05/2023 06:07 PM    SODIUM  137 10/09/2023 05:48 AM    POTASSIUM 3.9 10/09/2023 05:48 AM    CHLORIDE 101 10/09/2023 05:48 AM    CO2 28 10/09/2023 05:48 AM    BUN 3 (L) 10/09/2023 05:48 AM    CREATININE 0.53 (L) 10/09/2023 05:48 AM    GLUCOSE neg 11/11/2021 12:00 AM    AST 106 (H) 10/09/2023 05:48 AM    ALT 60 (H) 10/09/2023 05:48 AM     Assessment and Plan:  Type 2 Diabetes Mellitus complicated by hyperglycemia/HTN  -Most recent HgA1C 7.3% in 09/2023, will repeat in 3 months. Urine microalbumin/creatinine ratio 400, will repeat yearly.    -Most recent annual diabetic eye exam -advised to schedule.   -Hypoglycemia not currently occurring.  -Continue Lantus  10 units daily   -Labs completed, while in the hospital, for Type 1 diabetes, and were negative.   -Has test strips, lancets, glucometer, pen needles (or syringes).  -Advised patient to continue checking sugars 2-3x/day and to bring sugar logs or glucometer to clinic appointments  -Recent labs, completed at his PCP's office, will be requested.   -Asked patient to bring meter by the office to upload results for review and to make further medication recommendations.     HgA1c Goal <7%  Yearly eye and daily foot exams discussed  Symptoms of potential hypoglycemia discussed and treatment of hypoglycemia discussed  Return visit in 6-9 months or  earlier if needed     Labs today:   No orders of the defined types were placed in this encounter.    On the day of the encounter, Alan Shropshire, FNP-C, spent a total of  30 minutes independently on this patient encounter including review of historical information, face to face patient care, documentation and post-visit activities.     Alan LOISE Shropshire, FNP-C  Advanced Practice Provider  [facility]

## 2024-06-06 ENCOUNTER — Ambulatory Visit: Payer: Self-pay

## 2024-06-06 ENCOUNTER — Other Ambulatory Visit: Payer: Self-pay

## 2024-06-06 VITALS — BP 138/79 | HR 82 | Temp 98.2°F | Resp 18 | Ht 66.0 in | Wt 165.0 lb

## 2024-06-06 DIAGNOSIS — R6882 Decreased libido: Secondary | ICD-10-CM

## 2024-06-06 DIAGNOSIS — E119 Type 2 diabetes mellitus without complications: Secondary | ICD-10-CM

## 2024-06-06 LAB — POCT HGB A1C: POCT HGB A1C: 5.5 % (ref 4–6)

## 2024-06-06 NOTE — Patient Instructions (Addendum)
-  [facility]-Dr.Colliver:Phone: [phone]     -Follow-up appointment will be scheduled for 3 months. Your follow-up appointment will be scheduled at our [facility]  [address], [address]  [place], [place] [address]  [phone]    -Continue Lantus  10 units daily     -Continue to check glucose readings daily    -Lab ordered to check testosterone level. Recommend to keep Urology appointment. Have completed fasting around 8am as possible

## 2024-06-10 ENCOUNTER — Encounter (HOSPITAL_BASED_OUTPATIENT_CLINIC_OR_DEPARTMENT_OTHER): Payer: Self-pay

## 2024-06-10 MED ORDER — LANTUS SOLOSTAR U-100 INSULIN 100 UNIT/ML (3 ML) SUBCUTANEOUS PEN: 10.0000 [IU] | PEN_INJECTOR | Freq: Every evening | SUBCUTANEOUS | 1 refills | Status: AC

## 2024-06-10 MED ORDER — TRUEPLUS LANCETS 28 GAUGE: 3 refills | Status: AC

## 2024-06-10 MED ORDER — TRUE METRIX GLUCOSE TEST STRIP: ORAL_STRIP | 3 refills | Status: AC

## 2024-06-28 ENCOUNTER — Telehealth (INDEPENDENT_AMBULATORY_CARE_PROVIDER_SITE_OTHER): Payer: Self-pay | Admitting: NURSE PRACTITIONER

## 2024-09-10 ENCOUNTER — Ambulatory Visit (HOSPITAL_BASED_OUTPATIENT_CLINIC_OR_DEPARTMENT_OTHER): Payer: Self-pay
